# Patient Record
Sex: Female | Born: 1990 | Race: Black or African American | Hispanic: No | Marital: Married | Smoking: Never smoker
Health system: Southern US, Community
[De-identification: ages and names within clinical notes are randomized; demographics above are authoritative.]

## PROBLEM LIST (undated history)

## (undated) ENCOUNTER — Inpatient Hospital Stay: Payer: Self-pay

## (undated) ENCOUNTER — Inpatient Hospital Stay (HOSPITAL_COMMUNITY): Payer: Self-pay

## (undated) DIAGNOSIS — O24419 Gestational diabetes mellitus in pregnancy, unspecified control: Secondary | ICD-10-CM

## (undated) DIAGNOSIS — D573 Sickle-cell trait: Secondary | ICD-10-CM

## (undated) DIAGNOSIS — Z789 Other specified health status: Secondary | ICD-10-CM

## (undated) DIAGNOSIS — R7303 Prediabetes: Secondary | ICD-10-CM

## (undated) HISTORY — PX: TUBAL LIGATION: SHX77

## (undated) HISTORY — PX: NO PAST SURGERIES: SHX2092

## (undated) HISTORY — DX: Sickle-cell trait: D57.3

## (undated) HISTORY — DX: Prediabetes: R73.03

## (undated) HISTORY — DX: Gestational diabetes mellitus in pregnancy, unspecified control: O24.419

---
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
## 2012-04-07 ENCOUNTER — Encounter (HOSPITAL_COMMUNITY): Payer: Self-pay | Admitting: Emergency Medicine

## 2012-04-07 DIAGNOSIS — R109 Unspecified abdominal pain: Secondary | ICD-10-CM | POA: Insufficient documentation

## 2012-04-07 DIAGNOSIS — Z3202 Encounter for pregnancy test, result negative: Secondary | ICD-10-CM | POA: Insufficient documentation

## 2012-04-07 DIAGNOSIS — N898 Other specified noninflammatory disorders of vagina: Secondary | ICD-10-CM | POA: Insufficient documentation

## 2012-04-07 NOTE — ED Notes (Addendum)
C/o intermittent headache and abd pain x 2 weeks.  Pt states she did not have her period in December and started spotting today.  Vomited x 1 on Tuesday.  Denies nausea and denies pain at present.

## 2012-04-08 ENCOUNTER — Emergency Department (HOSPITAL_COMMUNITY)
Admission: EM | Admit: 2012-04-08 | Discharge: 2012-04-08 | Disposition: A | Discharge status: Home / Self Care | Payer: Self-pay | Attending: Emergency Medicine | Admitting: Emergency Medicine

## 2012-04-08 DIAGNOSIS — N939 Abnormal uterine and vaginal bleeding, unspecified: Secondary | ICD-10-CM

## 2012-04-08 DIAGNOSIS — R109 Unspecified abdominal pain: Secondary | ICD-10-CM

## 2012-04-08 LAB — URINALYSIS, MICROSCOPIC ONLY
Ketones, ur: 15 mg/dL — AB
Specific Gravity, Urine: 1.04 — ABNORMAL HIGH (ref 1.005–1.030)
Urobilinogen, UA: 1 mg/dL (ref 0.0–1.0)

## 2012-04-08 LAB — WET PREP, GENITAL
Trich, Wet Prep: NONE SEEN
Yeast Wet Prep HPF POC: NONE SEEN

## 2012-04-08 LAB — POCT PREGNANCY, URINE: Preg Test, Ur: NEGATIVE

## 2012-04-08 MED ORDER — NAPROXEN 500 MG PO TABS: 500.0000 mg | ORAL_TABLET | Freq: Two times a day (BID) | ORAL | Status: DC

## 2012-04-08 MED ORDER — KETOROLAC TROMETHAMINE 60 MG/2ML IM SOLN
60.0000 mg | Freq: Once | INTRAMUSCULAR | Status: AC
  Administered 2012-04-08: 60 mg via INTRAMUSCULAR
  Filled 2012-04-08: qty 2

## 2012-04-08 NOTE — ED Provider Notes (Signed)
History     CSN: [id number]  Arrival date & time 04/07/12  2252   First MD Initiated Contact with Patient 04/08/12 0054      Chief Complaint  Patient presents with  . Abdominal Pain    (Consider location/radiation/quality/duration/timing/severity/associated sxs/prior treatment) HPI Comments: 22 year old female with no past medical or surgical history presents with intermittent lower abdominal pain for the last couple of weeks. The patient readily admits that she has not been using her birth control pills regularly and will take them for a couple of days, missed a couple of days and then take a double pills the next day. This has been going on for some time, her menstrual periods have been somewhat irregular and she has not had a period in more than 6 weeks. Today she started having some spotting in conjunction with her lower abdominal tenderness was concerned hence her visit. She denies fevers chills nausea vomiting diarrhea back pain shortness of breath or coughing. She denies any history of being pregnant, she is sexually active but has not had any vaginal discharge. She admits to having one partner. This abdominal pain is intermittent, aching, not present at this time.  Patient is a 22 y.o. female presenting with abdominal pain. The history is provided by the patient.  Abdominal Pain The primary symptoms of the illness include abdominal pain.    Past Medical History  Diagnosis Date  . No pertinent past medical history     Past Surgical History  Procedure Date  . No past surgeries     Family History  Problem Relation Age of Onset  . Asthma Brother     History  Substance Use Topics  . Smoking status: Never Smoker   . Smokeless tobacco: Not on file  . Alcohol Use: No    OB History    Grav Para Term Preterm Abortions TAB SAB Ect Mult Living   0               Review of Systems  Gastrointestinal: Positive for abdominal pain.  All other systems reviewed and are  negative.    Allergies  Review of patient's allergies indicates no known allergies.  Home Medications   Current Outpatient Rx  Name  Route  Sig  Dispense  Refill  . NAPROXEN 500 MG PO TABS   Oral   Take 1 tablet (500 mg total) by mouth 2 (two) times daily with a meal.   30 tablet   0     BP 120/80  Pulse 87  Temp 98.1 F (36.7 C) (Oral)  Resp 18  SpO2 95%  LMP 04/07/2012  Physical Exam  Nursing note and vitals reviewed. Constitutional: She appears well-developed and well-nourished. No distress.  HENT:  Head: Normocephalic and atraumatic.  Mouth/Throat: Oropharynx is clear and moist. No oropharyngeal exudate.  Eyes: Conjunctivae normal and EOM are normal. Pupils are equal, round, and reactive to light. Right eye exhibits no discharge. Left eye exhibits no discharge. No scleral icterus.  Neck: Normal range of motion. Neck supple. No JVD present. No thyromegaly present.  Cardiovascular: Normal rate, regular rhythm, normal heart sounds and intact distal pulses.  Exam reveals no gallop and no friction rub.   No murmur heard. Pulmonary/Chest: Effort normal and breath sounds normal. No respiratory distress. She has no wheezes. She has no rales.  Abdominal: Soft. Bowel sounds are normal. She exhibits no distension and no mass. There is no tenderness.       No abdominal tenderness to  palpation  Genitourinary:       Chaperone present for vaginal exam, small amount of   blood in the vaginal vault, no cervical motion tenderness, cervical os closed, no adnexal masses or tenderness.  Musculoskeletal: Normal range of motion. She exhibits no edema and no tenderness.  Lymphadenopathy:    She has no cervical adenopathy.  Neurological: She is alert. Coordination normal.  Skin: Skin is warm and dry. No rash noted. No erythema.  Psychiatric: She has a normal mood and affect. Her behavior is normal.    ED Course  Procedures (including critical care time)  Labs Reviewed  URINALYSIS,  MICROSCOPIC ONLY - Abnormal; Notable for the following:    Color, Urine AMBER (*)  BIOCHEMICALS MAY BE AFFECTED BY COLOR   APPearance CLOUDY (*)     Specific Gravity, Urine 1.040 (*)     Hgb urine dipstick LARGE (*)     Bilirubin Urine SMALL (*)     Ketones, ur 15 (*)     Protein, ur 30 (*)     Leukocytes, UA SMALL (*)     Bacteria, UA FEW (*)     All other components within normal limits  POCT PREGNANCY, URINE  URINE CULTURE  WET PREP, GENITAL  GC/CHLAMYDIA PROBE AMP   No results found.   1. Vaginal bleeding   2. Abdominal pain       MDM  Laboratory workup shows that the patient has hematuria, negative pregnancy test and a slightly high specific gravity with mild ketonuria. Other than being mildly dehydrated I suspect that the hematuria is related to a contaminated sample with her vaginal bleeding. She has no signs of ovarian cysts and no tenderness that would be consistent with appendicitis. The patient will be treated with anti-inflammatories and asked to followup with her gynecologist. No imaging is indicated at this time the        Vida Roller, MD 04/08/12 [phone]

## 2012-04-09 LAB — URINE CULTURE

## 2012-04-10 LAB — GC/CHLAMYDIA PROBE AMP
CT Probe RNA: POSITIVE — AB
GC Probe RNA: NEGATIVE

## 2012-04-11 NOTE — ED Notes (Signed)
+  Chlamydia Chart sent to EDP office for review.  

## 2012-04-12 NOTE — ED Notes (Signed)
Rx called to [facility] by KIM PFM

## 2012-04-12 NOTE — ED Notes (Signed)
Patient informed of positive results after id'd x 2 and informed of need to notify partner to be treated. [facility] faxed.

## 2012-04-12 NOTE — ED Notes (Signed)
Rx for Doxycyline 100 BID x 7 days & Cefixime 400 mg po x 1 written by Jonathon Jordan needs to be called to pharmacy.

## 2013-05-23 ENCOUNTER — Emergency Department (HOSPITAL_COMMUNITY): Payer: Medicaid Other

## 2013-05-23 ENCOUNTER — Emergency Department (HOSPITAL_COMMUNITY)
Admission: EM | Admit: 2013-05-23 | Discharge: 2013-05-24 | Disposition: A | Discharge status: Home / Self Care | Payer: Medicaid Other | Attending: Emergency Medicine | Admitting: Emergency Medicine

## 2013-05-23 ENCOUNTER — Encounter (HOSPITAL_COMMUNITY): Payer: Self-pay | Admitting: Emergency Medicine

## 2013-05-23 DIAGNOSIS — S99929A Unspecified injury of unspecified foot, initial encounter: Principal | ICD-10-CM

## 2013-05-23 DIAGNOSIS — Z23 Encounter for immunization: Secondary | ICD-10-CM | POA: Insufficient documentation

## 2013-05-23 DIAGNOSIS — S8990XA Unspecified injury of unspecified lower leg, initial encounter: Secondary | ICD-10-CM

## 2013-05-23 DIAGNOSIS — IMO0002 Reserved for concepts with insufficient information to code with codable children: Secondary | ICD-10-CM | POA: Insufficient documentation

## 2013-05-23 DIAGNOSIS — W1809XA Striking against other object with subsequent fall, initial encounter: Secondary | ICD-10-CM | POA: Insufficient documentation

## 2013-05-23 DIAGNOSIS — S99919A Unspecified injury of unspecified ankle, initial encounter: Principal | ICD-10-CM

## 2013-05-23 DIAGNOSIS — Z791 Long term (current) use of non-steroidal anti-inflammatories (NSAID): Secondary | ICD-10-CM | POA: Insufficient documentation

## 2013-05-23 DIAGNOSIS — Y9301 Activity, walking, marching and hiking: Secondary | ICD-10-CM | POA: Insufficient documentation

## 2013-05-23 DIAGNOSIS — Y92410 Unspecified street and highway as the place of occurrence of the external cause: Secondary | ICD-10-CM | POA: Insufficient documentation

## 2013-05-23 NOTE — ED Notes (Signed)
Presents with fall post argument with a family member. Pt reports walking down the street and tripping and falling forward, injuring her left knee and right hand and hitting the right side of her head. Denies LOC. Reports pain all over. Difficult to get information from, unwilling to speak or elaborate much. No deformity to knee.

## 2013-05-23 NOTE — ED Notes (Signed)
Patient is not forth coming with information. Does not want to talk to staff.

## 2013-05-23 NOTE — ED Provider Notes (Signed)
CSN: [id number]     Arrival date & time 05/23/13  2155 History  This chart was scribed for non-physician practitioner, Arthor Captain, PA-C working with Vida Roller, MD by Greggory Stallion, ED scribe. This patient was seen in room TR09C/TR09C and the patient's care was started at 12:05 AM.   Chief Complaint  Patient presents with  . Fall   The history is provided by the patient. No language interpreter was used.   HPI Comments: Sarah Mayo is a 23 y.o. female who presents to the Emergency Department complaining of a fall that occurred earlier tonight. She tripped, fell forward and landed on left knee. Pt states she also his the right side of her head but denies LOC. She has sudden onset left knee. Ambulation and bending her knee worsen the pain. Pt also has an abrasion to her left knee. She is unsure of when her last tetanus was.   Past Medical History  Diagnosis Date  . No pertinent past medical history    Past Surgical History  Procedure Laterality Date  . No past surgeries     Family History  Problem Relation Age of Onset  . Asthma Brother    History  Substance Use Topics  . Smoking status: Never Smoker   . Smokeless tobacco: Not on file  . Alcohol Use: No   OB History   Grav Para Term Preterm Abortions TAB SAB Ect Mult Living   0              Review of Systems  Constitutional: Negative for fever.  HENT: Negative for congestion.   Eyes: Negative for redness.  Respiratory: Negative for shortness of breath.   Cardiovascular: Negative for chest pain.  Gastrointestinal: Negative for abdominal distention.  Musculoskeletal: Positive for arthralgias.  Skin: Positive for wound.  Neurological: Negative for speech difficulty.  Psychiatric/Behavioral: Negative for confusion.   Allergies  Review of patient's allergies indicates no known allergies.  Home Medications   Current Outpatient Rx  Name  Route  Sig  Dispense  Refill  . naproxen (NAPROSYN) 500 MG tablet  Oral   Take 1 tablet (500 mg total) by mouth 2 (two) times daily with a meal.   30 tablet   0    BP 120/70  Pulse 100  Temp(Src) 97.7 F (36.5 C) (Oral)  Resp 18  SpO2 100%  LMP 05/21/2013  Physical Exam  Nursing note and vitals reviewed. Constitutional: She is oriented to person, place, and time. She appears well-developed and well-nourished. No distress.  HENT:  Head: Normocephalic and atraumatic.  Eyes: EOM are normal.  Neck: Neck supple. No tracheal deviation present.  Cardiovascular: Normal rate.   Pulmonary/Chest: Effort normal. No respiratory distress.  Musculoskeletal: Normal range of motion.  Patella located in the same position bilaterally. Hyperextensible knees. Pain with flexion. Ligamentously stable.   Neurological: She is alert and oriented to person, place, and time.  Skin: Skin is warm and dry.  Psychiatric: She has a normal mood and affect. Her behavior is normal.    ED Course  Procedures (including critical care time)  DIAGNOSTIC STUDIES: Oxygen Saturation is 100% on RA, normal by my interpretation.    COORDINATION OF CARE: 12:08 AM-Discussed treatment plan which includes knee brace and crutches with pt at bedside and pt agreed to plan. Advised pt to follow up with an orthopedist if symptoms do not resolve.   Labs Review Labs Reviewed  POC URINE PREG, ED   Imaging Review Dg Knee  2 Views Left  05/23/2013   CLINICAL DATA:  Fall.  Pain.  EXAM: LEFT KNEE - 1-2 VIEW  COMPARISON:  None.  FINDINGS: Two view examination of the left knee without fracture or dislocation. Patella is slightly high riding. No obvious disruption of the patellar tendon.  IMPRESSION: No fracture or dislocation.  High riding patella.   Electronically Signed   By: Bridgett LarssonSteve  Olson M.D.   On: 05/23/2013 23:17     EKG Interpretation None      MDM   Final diagnoses:  Knee injury   Patient X-Ray negative for obvious fracture or dislocation. Pain managed in ED. Pt advised to follow up  with orthopedics if symptoms persist for possibility of missed fracture diagnosis. Patient given brace while in ED, conservative therapy recommended and discussed. Patient will be dc home & is agreeable with above plan.   I personally performed the services described in this documentation, which was scribed in my presence. The recorded information has been reviewed and is accurate.     Arthor CaptainAbigail Oval Moralez, PA-C 05/24/13 [phone]

## 2013-05-24 MED ORDER — TETANUS-DIPHTH-ACELL PERTUSSIS 5-2.5-18.5 LF-MCG/0.5 IM SUSP
0.5000 mL | Freq: Once | INTRAMUSCULAR | Status: AC
  Administered 2013-05-24: 0.5 mL via INTRAMUSCULAR
  Filled 2013-05-24: qty 0.5

## 2013-05-24 NOTE — Discharge Instructions (Signed)
Knee Sprain  A knee sprain is a tear in one of the strong, fibrous tissues that connect the bones (ligaments) in your knee. The severity of the sprain depends on how much of the ligament is torn. The tear can be either partial or complete.  CAUSES   Often, sprains are a result of a fall or injury. The force of the impact causes the fibers of your ligament to stretch too much. This excess tension causes the fibers of your ligament to tear.  SIGNS AND SYMPTOMS   You may have some loss of motion in your knee. Other symptoms include:   Bruising.   Pain in the knee area.   Tenderness of the knee to the touch.   Swelling.  DIAGNOSIS   To diagnose a knee sprain, your health care provider will physically examine your knee. Your health care provider may also suggest an X-ray exam of your knee to make sure no bones are broken.  TREATMENT   If your ligament is only partially torn, treatment usually involves keeping the knee in a fixed position (immobilization) or bracing your knee for activities that require movement for several weeks. To do this, your health care provider will apply a bandage, cast, or splint to keep your knee from moving and to support your knee during movement until it heals. For a partially torn ligament, the healing process usually takes 4 6 weeks.  If your ligament is completely torn, depending on which ligament it is, you may need surgery to reconnect the ligament to the bone or reconstruct it. After surgery, a cast or splint may be applied and will need to stay on your knee for 4 6 weeks while your ligament heals.  HOME CARE INSTRUCTIONS   Keep your injured knee elevated to decrease swelling.   To ease pain and swelling, apply ice to the injured area:   Put ice in a plastic bag.   Place a towel between your skin and the bag.   Leave the ice on for 20 minutes, 2 3 times a day.   Only take medicine for pain as directed by your health care provider.   Do not leave your knee unprotected until  pain and stiffness go away (usually 4 6 weeks).   If you have a cast or splint, do not allow it to get wet. If you have been instructed not to remove it, cover it with a plastic bag when you shower or bathe. Do not swim.   Your health care provider may suggest exercises for you to do during your recovery to prevent or limit permanent weakness and stiffness.  SEEK IMMEDIATE MEDICAL CARE IF:   Your cast or splint becomes damaged.   Your pain becomes worse.   You have significant pain, swelling, or numbness below the cast or splint.  MAKE SURE YOU:   Understand these instructions.   Will watch your condition.   Will get help right away if you are not doing well or get worse.  Document Released: 03/08/2005 Document Revised: 12/27/2012 Document Reviewed: 10/18/2012  [employer] Patient Information 2014 [employer].

## 2013-05-25 NOTE — ED Provider Notes (Signed)
Medical screening examination/treatment/procedure(s) were performed by non-physician practitioner and as supervising physician I was immediately available for consultation/collaboration.    Michela Herst D Soo Steelman, MD 05/25/13 0028 

## 2013-11-23 ENCOUNTER — Inpatient Hospital Stay (HOSPITAL_COMMUNITY): Payer: Medicaid Other

## 2013-11-23 ENCOUNTER — Inpatient Hospital Stay (HOSPITAL_COMMUNITY)
Admission: AD | Admit: 2013-11-23 | Discharge: 2013-11-23 | Disposition: A | Discharge status: Home / Self Care | Payer: Medicaid Other | Source: Ambulatory Visit | Attending: Obstetrics & Gynecology | Admitting: Obstetrics & Gynecology

## 2013-11-23 ENCOUNTER — Encounter (HOSPITAL_COMMUNITY): Payer: Self-pay | Admitting: Emergency Medicine

## 2013-11-23 ENCOUNTER — Emergency Department (INDEPENDENT_AMBULATORY_CARE_PROVIDER_SITE_OTHER)
Admission: EM | Admit: 2013-11-23 | Discharge: 2013-11-23 | Disposition: A | Discharge status: Other Acute Inpatient Hospital | Payer: Medicaid Other | Source: Home / Self Care | Attending: Emergency Medicine | Admitting: Emergency Medicine

## 2013-11-23 ENCOUNTER — Encounter (HOSPITAL_COMMUNITY): Payer: Self-pay

## 2013-11-23 DIAGNOSIS — R1084 Generalized abdominal pain: Secondary | ICD-10-CM

## 2013-11-23 DIAGNOSIS — Z3201 Encounter for pregnancy test, result positive: Secondary | ICD-10-CM

## 2013-11-23 DIAGNOSIS — O99891 Other specified diseases and conditions complicating pregnancy: Secondary | ICD-10-CM | POA: Diagnosis not present

## 2013-11-23 DIAGNOSIS — O26899 Other specified pregnancy related conditions, unspecified trimester: Secondary | ICD-10-CM

## 2013-11-23 DIAGNOSIS — R109 Unspecified abdominal pain: Secondary | ICD-10-CM | POA: Diagnosis not present

## 2013-11-23 DIAGNOSIS — Z3491 Encounter for supervision of normal pregnancy, unspecified, first trimester: Secondary | ICD-10-CM

## 2013-11-23 DIAGNOSIS — O9989 Other specified diseases and conditions complicating pregnancy, childbirth and the puerperium: Secondary | ICD-10-CM

## 2013-11-23 HISTORY — DX: Other specified health status: Z78.9

## 2013-11-23 LAB — CBC WITH DIFFERENTIAL/PLATELET
Basophils Absolute: 0.1 10*3/uL (ref 0.0–0.1)
Basophils Relative: 1 % (ref 0–1)
EOS PCT: 1 % (ref 0–5)
Eosinophils Absolute: 0.1 10*3/uL (ref 0.0–0.7)
HEMATOCRIT: 33.5 % — AB (ref 36.0–46.0)
HEMOGLOBIN: 11.8 g/dL — AB (ref 12.0–15.0)
LYMPHS ABS: 2.7 10*3/uL (ref 0.7–4.0)
LYMPHS PCT: 27 % (ref 12–46)
MCH: 28.2 pg (ref 26.0–34.0)
MCHC: 35.2 g/dL (ref 30.0–36.0)
MCV: 80.1 fL (ref 78.0–100.0)
MONO ABS: 0.8 10*3/uL (ref 0.1–1.0)
MONOS PCT: 8 % (ref 3–12)
NEUTROS ABS: 6.4 10*3/uL (ref 1.7–7.7)
Neutrophils Relative %: 64 % (ref 43–77)
Platelets: 270 10*3/uL (ref 150–400)
RBC: 4.18 MIL/uL (ref 3.87–5.11)
RDW: 13.8 % (ref 11.5–15.5)
WBC: 10 10*3/uL (ref 4.0–10.5)

## 2013-11-23 LAB — POCT URINALYSIS DIP (DEVICE)
Bilirubin Urine: NEGATIVE
GLUCOSE, UA: NEGATIVE mg/dL
HGB URINE DIPSTICK: NEGATIVE
KETONES UR: NEGATIVE mg/dL
Nitrite: NEGATIVE
Protein, ur: NEGATIVE mg/dL
SPECIFIC GRAVITY, URINE: 1.02 (ref 1.005–1.030)
UROBILINOGEN UA: 2 mg/dL — AB (ref 0.0–1.0)
pH: 8.5 — ABNORMAL HIGH (ref 5.0–8.0)

## 2013-11-23 LAB — POCT PREGNANCY, URINE: PREG TEST UR: POSITIVE — AB

## 2013-11-23 LAB — HCG, QUANTITATIVE, PREGNANCY: hCG, Beta Chain, Quant, S: 228 m[IU]/mL — ABNORMAL HIGH (ref <5)

## 2013-11-23 LAB — ABO/RH: ABO/RH(D): A POS

## 2013-11-23 MED ORDER — PRENATAL VITAMINS PLUS 27-1 MG PO TABS: ORAL_TABLET | ORAL | Status: DC

## 2013-11-23 NOTE — MAU Note (Signed)
Patient was seen at Urgent Care today and sent to MAU for further evaluation. State she has had abdominal pain off and on, no bleeding or discharge. Some nausea, no vomiting.

## 2013-11-23 NOTE — MAU Provider Note (Signed)
History     CSN: [id number]  Arrival date and time: 11/23/13 4098   First Provider Initiated Contact with Patient 11/23/13 1851      Chief Complaint  Patient presents with  . Nausea  . Abdominal Pain   HPI Sarah Mayo is 23 y.o. G1P0 [redacted]w[redacted]d weeks presents for ectopic workup after being seen by Dr. Lorenz Coaster at [facility] earlier--she had + UPT and pelvic exam that he reports she had right sided tenderness without masses.  She denies vaginal bleeding.  Patient denies any pain at this time.    Past Medical History  Diagnosis Date  . No pertinent past medical history   . Medical history non-contributory     Past Surgical History  Procedure Laterality Date  . No past surgeries      Family History  Problem Relation Age of Onset  . Asthma Brother     History  Substance Use Topics  . Smoking status: Never Smoker   . Smokeless tobacco: Not on file  . Alcohol Use: No    Allergies: No Known Allergies  No prescriptions prior to admission    Review of Systems  Constitutional: Negative for fever and chills.  Gastrointestinal: Positive for abdominal pain (lower abdominal pain). Negative for nausea and vomiting.  Genitourinary:       Neg for vaginal bleeding  Neurological: Negative for headaches.   Physical Exam   Blood pressure 108/60, pulse 90, temperature 99.1 F (37.3 C), temperature source Oral, resp. rate 16, height  (1.727 m), weight 168 lb 3.2 oz (76.295 kg), last menstrual period 10/12/2013, SpO2 100.00%.  Physical Exam  Constitutional: She is oriented to person, place, and time. She appears well-developed and well-nourished. No distress.  HENT:  Head: Normocephalic.  Neck: Normal range of motion.  Cardiovascular: Normal rate.   Respiratory: Effort normal.  GI: Soft.  Genitourinary:  Pelvic exam was not repeated--neg for bleeding and pain at this time.  Neurological: She is alert and oriented to person, place, and time.  Skin: Skin is warm and dry.   Psychiatric: She has a normal mood and affect. Her behavior is normal.   Results for orders placed during the hospital encounter of 11/23/13 (from the past 24 hour(s))  CBC WITH DIFFERENTIAL     Status: Abnormal   Collection Time    11/23/13  6:45 PM      Result Value Ref Range   WBC 10.0  4.0 - 10.5 K/uL   RBC 4.18  3.87 - 5.11 MIL/uL   Hemoglobin 11.8 (*) 12.0 - 15.0 g/dL   HCT 11.9 (*) 14.7 - 82.9 %   MCV 80.1  78.0 - 100.0 fL   MCH 28.2  26.0 - 34.0 pg   MCHC 35.2  30.0 - 36.0 g/dL   RDW 56.2  13.0 - 86.5 %   Platelets 270  150 - 400 K/uL   Neutrophils Relative % 64  43 - 77 %   Neutro Abs 6.4  1.7 - 7.7 K/uL   Lymphocytes Relative 27  12 - 46 %   Lymphs Abs 2.7  0.7 - 4.0 K/uL   Monocytes Relative 8  3 - 12 %   Monocytes Absolute 0.8  0.1 - 1.0 K/uL   Eosinophils Relative 1  0 - 5 %   Eosinophils Absolute 0.1  0.0 - 0.7 K/uL   Basophils Relative 1  0 - 1 %   Basophils Absolute 0.1  0.0 - 0.1 K/uL  ABO/RH  Status: None   Collection Time    11/23/13  6:45 PM      Result Value Ref Range   ABO/RH(D) A POS    HCG, QUANTITATIVE, PREGNANCY     Status: Abnormal   Collection Time    11/23/13  6:45 PM      Result Value Ref Range   hCG, Beta Chain, Quant, S 228 (*) <5 mIU/mL   MAU Course  Procedures  UPT at [facility] was positive  MDM 20:00 Care turned over to [facility]. Mayford Knife, CNM Assessment and Plan  A:  Abdominal pain at [redacted]wks gestation  Sarah Mayo 11/23/2013, 7:42 PM    US Ob Transvaginal  11/23/2013   CLINICAL DATA:  Abdominal and pelvic pain. Positive urine pregnancy test.  EXAM: OBSTETRIC <14 WK [place] AND TRANSVAGINAL OB US  TECHNIQUE: Both transabdominal and transvaginal ultrasound examinations were performed for complete evaluation of the gestation as well as the maternal uterus, adnexal regions, and pelvic cul-de-sac. Transvaginal technique was performed to assess early pregnancy.  COMPARISON:  None.    FINDINGS: Intrauterine gestational sac: Not visualized  Yolk sac:  Not  visualized  Embryo:  Not visualized  Cardiac Activity: No embryo seen  Maternal uterus/adnexae: Right ovarian corpus luteum/ hemorrhagic cyst noted. Left ovary is normal. Moderate fluid is present in the cul-de-sac. Endometrial stripe measures 9 mm, trilaminar in appearance.    IMPRESSION: No intrauterine gestational sac, yolk sac, or fetal pole identified. Differential considerations include intrauterine pregnancy too early to be sonographically visualized, missed abortion, or ectopic pregnancy. Followup ultrasound is recommended in 10-14 days for further evaluation.     Electronically Signed   By: Christiana Pellant M.D.   On: 11/23/2013 20:39   Discussed findings. Cannot exclude ectopic pregnancy yet Recommend repeat Quant HCG in 48 hrs.  Then repeat US in a week or so Ectopic precautions reviewed Sarah Mayo, CNM

## 2013-11-23 NOTE — Discharge Instructions (Signed)
First Trimester of Pregnancy The first trimester of pregnancy is from week 1 until the end of week 12 (months 1 through 3). A week after a sperm fertilizes an egg, the egg will implant on the wall of the uterus. This embryo will begin to develop into a baby. Genes from you and your partner are forming the baby. The female genes determine whether the baby is a boy or a girl. At 6-8 weeks, the eyes and face are formed, and the heartbeat can be seen on ultrasound. At the end of 12 weeks, all the baby's organs are formed.  Now that you are pregnant, you will want to do everything you can to have a healthy baby. Two of the most important things are to get good prenatal care and to follow your health care provider's instructions. Prenatal care is all the medical care you receive before the baby's birth. This care will help prevent, find, and treat any problems during the pregnancy and childbirth. BODY CHANGES Your body goes through many changes during pregnancy. The changes vary from woman to woman.   You may gain or lose a couple of pounds at first.  You may feel sick to your stomach (nauseous) and throw up (vomit). If the vomiting is uncontrollable, call your health care provider.  You may tire easily.  You may develop headaches that can be relieved by medicines approved by your health care provider.  You may urinate more often. Painful urination may mean you have a bladder infection.  You may develop heartburn as a result of your pregnancy.  You may develop constipation because certain hormones are causing the muscles that push waste through your intestines to slow down.  You may develop hemorrhoids or swollen, bulging veins (varicose veins).  Your breasts may begin to grow larger and become tender. Your nipples may stick out more, and the tissue that surrounds them (areola) may become darker.  Your gums may bleed and may be sensitive to brushing and flossing.  Dark spots or blotches (chloasma,  mask of pregnancy) may develop on your face. This will likely fade after the baby is born.  Your menstrual periods will stop.  You may have a loss of appetite.  You may develop cravings for certain kinds of food.  You may have changes in your emotions from day to day, such as being excited to be pregnant or being concerned that something may go wrong with the pregnancy and baby.  You may have more vivid and strange dreams.  You may have changes in your hair. These can include thickening of your hair, rapid growth, and changes in texture. Some women also have hair loss during or after pregnancy, or hair that feels dry or thin. Your hair will most likely return to normal after your baby is born. WHAT TO EXPECT AT YOUR PRENATAL VISITS During a routine prenatal visit:  You will be weighed to make sure you and the baby are growing normally.  Your blood pressure will be taken.  Your abdomen will be measured to track your baby's growth.  The fetal heartbeat will be listened to starting around week 10 or 12 of your pregnancy.  Test results from any previous visits will be discussed. Your health care provider may ask you:  How you are feeling.  If you are feeling the baby move.  If you have had any abnormal symptoms, such as leaking fluid, bleeding, severe headaches, or abdominal cramping.  If you have any questions. Other tests   that may be performed during your first trimester include:  Blood tests to find your blood type and to check for the presence of any previous infections. They will also be used to check for low iron levels (anemia) and Rh antibodies. Later in the pregnancy, blood tests for diabetes will be done along with other tests if problems develop.  Urine tests to check for infections, diabetes, or protein in the urine.  An ultrasound to confirm the proper growth and development of the baby.  An amniocentesis to check for possible genetic problems.  Fetal screens for  spina bifida and Down syndrome.  You may need other tests to make sure you and the baby are doing well. HOME CARE INSTRUCTIONS  Medicines  Follow your health care provider's instructions regarding medicine use. Specific medicines may be either safe or unsafe to take during pregnancy.  Take your prenatal vitamins as directed.  If you develop constipation, try taking a stool softener if your health care provider approves. Diet  Eat regular, well-balanced meals. Choose a variety of foods, such as meat or vegetable-based protein, fish, milk and low-fat dairy products, vegetables, fruits, and whole grain breads and cereals. Your health care provider will help you determine the amount of weight gain that is right for you.  Avoid raw meat and uncooked cheese. These carry germs that can cause birth defects in the baby.  Eating four or five small meals rather than three large meals a day may help relieve nausea and vomiting. If you start to feel nauseous, eating a few soda crackers can be helpful. Drinking liquids between meals instead of during meals also seems to help nausea and vomiting.  If you develop constipation, eat more high-fiber foods, such as fresh vegetables or fruit and whole grains. Drink enough fluids to keep your urine clear or pale yellow. Activity and Exercise  Exercise only as directed by your health care provider. Exercising will help you:  Control your weight.  Stay in shape.  Be prepared for labor and delivery.  Experiencing pain or cramping in the lower abdomen or low back is a good sign that you should stop exercising. Check with your health care provider before continuing normal exercises.  Try to avoid standing for long periods of time. Move your legs often if you must stand in one place for a long time.  Avoid heavy lifting.  Wear low-heeled shoes, and practice good posture.  You may continue to have sex unless your health care provider directs you  otherwise. Relief of Pain or Discomfort  Wear a good support bra for breast tenderness.   Take warm sitz baths to soothe any pain or discomfort caused by hemorrhoids. Use hemorrhoid cream if your health care provider approves.   Rest with your legs elevated if you have leg cramps or low back pain.  If you develop varicose veins in your legs, wear support hose. Elevate your feet for 15 minutes, 3-4 times a day. Limit salt in your diet. Prenatal Care  Schedule your prenatal visits by the twelfth week of pregnancy. They are usually scheduled monthly at first, then more often in the last 2 months before delivery.  Write down your questions. Take them to your prenatal visits.  Keep all your prenatal visits as directed by your health care provider. Safety  Wear your seat belt at all times when driving.  Make a list of emergency phone numbers, including numbers for family, friends, the hospital, and police and fire departments. General Tips    Ask your health care provider for a referral to a local prenatal education class. Begin classes no later than at the beginning of month 6 of your pregnancy.  Ask for help if you have counseling or nutritional needs during pregnancy. Your health care provider can offer advice or refer you to specialists for help with various needs.  Do not use hot tubs, steam rooms, or saunas.  Do not douche or use tampons or scented sanitary pads.  Do not cross your legs for long periods of time.  Avoid cat litter boxes and soil used by cats. These carry germs that can cause birth defects in the baby and possibly loss of the fetus by miscarriage or stillbirth.  Avoid all smoking, herbs, alcohol, and medicines not prescribed by your health care provider. Chemicals in these affect the formation and growth of the baby.  Schedule a dentist appointment. At home, brush your teeth with a soft toothbrush and be gentle when you floss. SEEK MEDICAL CARE IF:   You have  dizziness.  You have mild pelvic cramps, pelvic pressure, or nagging pain in the abdominal area.  You have persistent nausea, vomiting, or diarrhea.  You have a bad smelling vaginal discharge.  You have pain with urination.  You notice increased swelling in your face, hands, legs, or ankles. SEEK IMMEDIATE MEDICAL CARE IF:   You have a fever.  You are leaking fluid from your vagina.  You have spotting or bleeding from your vagina.  You have severe abdominal cramping or pain.  You have rapid weight gain or loss.  You vomit blood or material that looks like coffee grounds.  You are exposed to German measles and have never had them.  You are exposed to fifth disease or chickenpox.  You develop a severe headache.  You have shortness of breath.  You have any kind of trauma, such as from a fall or a car accident. Document Released: 03/02/2001 Document Revised: 07/23/2013 Document Reviewed: 01/16/2013 [employer] Patient Information 2015 [employer]. This information is not intended to replace advice given to you by your health care provider. Make sure you discuss any questions you have with your health care provider.  

## 2013-11-23 NOTE — ED Provider Notes (Signed)
Chief Complaint   Chief Complaint  Patient presents with  . Abdominal Pain    History of Present Illness   Sarah Mayo is a 23 year old female who has had a two-day history of generalized abdominal pain. The pain comes and goes. Nothing makes it better or worse. It is not worse with meals. It's not better with passage of a bowel movement or passage of flatus. She's felt somewhat nauseated and dizzy. She denies any fever, chills, vomiting, or anorexia. The patient states that her appetite has been increased. She denies any urinary symptoms. No diarrhea, constipation, or blood in the stool. She denies any vaginal discharge, itching, or odor. Her last menstrual period was July 24. The patient is sexually active without any use of birth control. She denies any morning sickness or breast swelling or tenderness. She has had no vaginal bleeding or passage of clots or tissue.  Review of Systems   Other than as noted above, the patient denies any of the following symptoms: Systemic:  No fever or chills GI:  No abdominal pain, nausea, vomiting, diarrhea, constipation, melena or hematochezia. GU:  No dysuria, frequency, urgency, hematuria, vaginal discharge, itching, or abnormal vaginal bleeding.  PMFSH   Past medical history, family history, social history, meds, and allergies were reviewed.    Physical Examination    Vital signs:  BP 113/78  Pulse 97  Temp(Src) 99.2 F (37.3 C) (Oral)  Resp 14  SpO2 100%  LMP 10/12/2013 General:  Alert, oriented and in no distress. Lungs:  Breath sounds clear and equal bilaterally.  No wheezes, rales or rhonchi. Heart:  Regular rhythm.  No gallops or murmers. Abdomen:  Soft, flat and non-distended.  No organomegaly or mass.  There is mild, generalized tenderness to palpation without guarding or rebound.  Bowel sounds normally active. Pelvic exam:  Normal external genitalia. There was a small amount of white vaginal discharge. No bleeding. There was  no pain on cervical motion. Uterus was normal in size and shape and nontender. She has bilateral adnexal tenderness worse on the left than the right and no adnexal mass.  DNA probes for gonorrhea, Chlamydia, Trichomonas, Gardnerella, Candida were obtained. Skin:  Clear, warm and dry.  Chaperoned by Michiel Cowboy, RN who was present throughout the pelvic exam.   Labs   Results for orders placed during the hospital encounter of 11/23/13  POCT URINALYSIS DIP (DEVICE)      Result Value Ref Range   Glucose, UA NEGATIVE  NEGATIVE mg/dL   Bilirubin Urine NEGATIVE  NEGATIVE   Ketones, ur NEGATIVE  NEGATIVE mg/dL   Specific Gravity, Urine 1.020  1.005 - 1.030   Hgb urine dipstick NEGATIVE  NEGATIVE   pH 8.5 (*) 5.0 - 8.0   Protein, ur NEGATIVE  NEGATIVE mg/dL   Urobilinogen, UA 2.0 (*) 0.0 - 1.0 mg/dL   Nitrite NEGATIVE  NEGATIVE   Leukocytes, UA TRACE (*) NEGATIVE  POCT PREGNANCY, URINE      Result Value Ref Range   Preg Test, Ur POSITIVE (*) NEGATIVE    Assessment   The primary encounter diagnosis was Generalized abdominal pain. A diagnosis of First trimester pregnancy was also pertinent to this visit.  There is a concern for ectopic pregnancy with adnexal tenderness and early pregnancy. The patient was therefore sent to [facility] hospital. I called and spoke to the provider at the MAU. She will go directly care and was instructed not to either drink anything. She has a friend who can drive  her over there. She is safe to travel by private vehicle since she is hemodynamically stable.       Plan    1.  Meds:  The following meds were prescribed:   Discharge Medication List as of 11/23/2013  5:39 PM    START taking these medications   Details  Prenatal Vit-Fe Fumarate-FA (PRENATAL VITAMINS PLUS) 27-1 MG TABS Take 1 daily, Print        2.  Patient Education/Counseling:  The patient was given appropriate handouts, self care instructions, and instructed in symptomatic relief.    3.  Follow  up:  The patient was told to follow up here if no better in 3 to 4 days, or sooner if becoming worse in any way, and given some red flag symptoms such as worsening pain, fever, persistent vomiting, or heavy vaginal bleeding which would prompt immediate return.       Reuben Likes, MD 11/23/13 [phone]

## 2013-11-23 NOTE — ED Notes (Signed)
C/o lower abdominal area pain  X 2 days. LMP 7-24 through 8-1. Sexually active w/o BC, although reportedly not attempting to conceive. Although having steady pain, has not passed any blood or tissue

## 2013-11-24 NOTE — MAU Provider Note (Signed)
Attestation of Attending Supervision of Advanced Practitioner (CNM/NP): Evaluation and management procedures were performed by the Advanced Practitioner under my supervision and collaboration. I have reviewed the Advanced Practitioner's note and chart, and I agree with the management and plan.  Sarah Mayo H. 12:30 AM

## 2013-11-25 ENCOUNTER — Inpatient Hospital Stay (HOSPITAL_COMMUNITY)
Admission: AD | Admit: 2013-11-25 | Discharge: 2013-11-25 | Disposition: A | Discharge status: Home / Self Care | Payer: Medicaid Other | Source: Ambulatory Visit | Attending: Obstetrics & Gynecology | Admitting: Obstetrics & Gynecology

## 2013-11-25 DIAGNOSIS — R109 Unspecified abdominal pain: Secondary | ICD-10-CM | POA: Insufficient documentation

## 2013-11-25 DIAGNOSIS — O26899 Other specified pregnancy related conditions, unspecified trimester: Secondary | ICD-10-CM

## 2013-11-25 DIAGNOSIS — O9989 Other specified diseases and conditions complicating pregnancy, childbirth and the puerperium: Principal | ICD-10-CM

## 2013-11-25 DIAGNOSIS — O99891 Other specified diseases and conditions complicating pregnancy: Secondary | ICD-10-CM | POA: Insufficient documentation

## 2013-11-25 LAB — HCG, QUANTITATIVE, PREGNANCY: HCG, BETA CHAIN, QUANT, S: 696 m[IU]/mL — AB (ref <5)

## 2013-11-25 NOTE — MAU Note (Signed)
Here for follow up labs. No complaints.

## 2013-11-25 NOTE — MAU Provider Note (Signed)
  History     CSN: [id number]  Arrival date and time: 11/25/13 2029   None     No chief complaint on file.  HPI  Pt is a 23 yo G1P0 at [redacted]w[redacted]d wks pregnancy here for follow-up BHCG.  Seen on 11/23/13  for ectopic workup after being seen by Dr. Lorenz Coaster at [facility] earlier--she had + UPT and pelvic exam that he reported right sided tenderness without masses. Pt did not have any vaginal bleeding at that visit.  BHCG was 228 and ultrasound showed IUGS, no yolk sac or embryo.  Here tonight with no report of pelvic pain or vaginal bleeding.     Past Medical History  Diagnosis Date  . No pertinent past medical history   . Medical history non-contributory     Past Surgical History  Procedure Laterality Date  . No past surgeries      Family History  Problem Relation Age of Onset  . Asthma Brother     History  Substance Use Topics  . Smoking status: Never Smoker   . Smokeless tobacco: Not on file  . Alcohol Use: No    Allergies: No Known Allergies  No prescriptions prior to admission    Review of Systems  Gastrointestinal: Negative for abdominal pain.  Genitourinary:       Neg vaginal bleeding   Pertinent information in HPI  Physical Exam   Last menstrual period 10/12/2013.  Physical Exam  Constitutional: She is oriented to person, place, and time. She appears well-developed and well-nourished. No distress.  HENT:  Head: Normocephalic.  Neck: Normal range of motion. Neck supple.  Neurological: She is alert and oriented to person, place, and time. She has normal reflexes.  Skin: Skin is warm and dry.    MAU Course  Procedures Results for orders placed during the hospital encounter of 11/25/13 (from the past 24 hour(s))  HCG, QUANTITATIVE, PREGNANCY     Status: Abnormal   Collection Time    11/25/13  8:55 PM      Result Value Ref Range   hCG, Beta Chain, Quant, S 696 (*) <5 mIU/mL      Assessment and Plan  Follow-up BHCG  Plan: Discharge to home Repeat  ultrasound in 7 days Reviewed ectopic precautions  Elenora Fender [facility] 11/25/2013, 9:03 PM

## 2013-11-26 NOTE — MAU Provider Note (Signed)
Attestation of Attending Supervision of Advanced Practitioner (CNM/NP): Evaluation and management procedures were performed by the Advanced Practitioner under my supervision and collaboration. I have reviewed the Advanced Practitioner's note and chart, and I agree with the management and plan.  Rikayla Demmon H. 5:41 AM

## 2013-11-27 ENCOUNTER — Other Ambulatory Visit (HOSPITAL_COMMUNITY)
Admission: RE | Admit: 2013-11-27 | Discharge: 2013-11-27 | Disposition: A | Discharge status: Home / Self Care | Payer: Medicaid Other | Source: Ambulatory Visit | Attending: Family Medicine | Admitting: Family Medicine

## 2013-11-27 DIAGNOSIS — Z113 Encounter for screening for infections with a predominantly sexual mode of transmission: Secondary | ICD-10-CM | POA: Insufficient documentation

## 2013-11-27 DIAGNOSIS — N760 Acute vaginitis: Secondary | ICD-10-CM | POA: Insufficient documentation

## 2013-11-28 ENCOUNTER — Telehealth (HOSPITAL_COMMUNITY): Payer: Self-pay | Admitting: Emergency Medicine

## 2013-11-28 MED ORDER — METRONIDAZOLE 500 MG PO TABS: 500.0000 mg | ORAL_TABLET | Freq: Two times a day (BID) | ORAL | Status: DC

## 2013-11-28 NOTE — ED Notes (Signed)
The patient's DNA probe came back positive for Gardnerella. She will need metronidazole 500 mg, #14, 1 twice a day for one week. This will be sent to her pharmacy. We will need to call her and let her know these results.   Reuben Likes, MD 11/28/13 [phone]

## 2013-11-29 ENCOUNTER — Emergency Department (HOSPITAL_COMMUNITY)
Admission: EM | Admit: 2013-11-29 | Discharge: 2013-11-29 | Discharge status: Absent without leave | Payer: Medicaid Other | Source: Home / Self Care

## 2013-11-29 ENCOUNTER — Telehealth (HOSPITAL_COMMUNITY): Payer: Self-pay | Admitting: *Deleted

## 2013-11-29 NOTE — ED Notes (Signed)
Pt. checked in by mistake.  I told her I had asked her Mom to have her call me back.  I verified pt. and gave her the lab results.  Pt. Told where to pick up her Rx.   Pt. instructed to no alcohol while taking this medication.  Pt. Said she can't drink anyway because she is pregnant.  I told her it was OK to take this medication while pregnant per previous discussions with doctors.

## 2013-11-29 NOTE — ED Notes (Addendum)
GC/Chlamydia neg., Affirm: Candida and Trich neg., Gardnerella pos.  Dr. Lorenz Coaster e-prescribed Flagyl to [facility] at [facility] yesterday.  I called pt. but phone message said she is not available.  I called contact and left a message for pt. to call.  Call 1. 11/29/2013 Pt. came to [facility] and checked in by mistake.  I talked to her and told her she needs Flagyl for bacterial vaginosis and where to pick up her Rx. Pt. discharged due to error.  Will notify business manager to make sure she is not charged for that. Vassie Moselle 11/29/2013

## 2013-12-03 ENCOUNTER — Telehealth: Payer: Self-pay | Admitting: *Deleted

## 2013-12-03 ENCOUNTER — Telehealth: Payer: Self-pay | Admitting: Obstetrics and Gynecology

## 2013-12-03 ENCOUNTER — Encounter (HOSPITAL_COMMUNITY): Payer: Self-pay | Admitting: Obstetrics and Gynecology

## 2013-12-03 ENCOUNTER — Ambulatory Visit (HOSPITAL_COMMUNITY)
Admission: RE | Admit: 2013-12-03 | Discharge: 2013-12-03 | Disposition: A | Discharge status: Home / Self Care | Payer: Medicaid Other | Source: Ambulatory Visit | Attending: Family | Admitting: Family

## 2013-12-03 DIAGNOSIS — O99891 Other specified diseases and conditions complicating pregnancy: Secondary | ICD-10-CM | POA: Diagnosis not present

## 2013-12-03 DIAGNOSIS — R109 Unspecified abdominal pain: Secondary | ICD-10-CM | POA: Diagnosis not present

## 2013-12-03 DIAGNOSIS — O9989 Other specified diseases and conditions complicating pregnancy, childbirth and the puerperium: Principal | ICD-10-CM

## 2013-12-03 DIAGNOSIS — O26899 Other specified pregnancy related conditions, unspecified trimester: Secondary | ICD-10-CM

## 2013-12-03 NOTE — Telephone Encounter (Addendum)
Pt called and asked about her [place] she had this am.  I asked pt what exactly she wanted to know.  Pt wanted to know how far a long she is.  I explained to the pt that the US showed that she was 6w 1d.  I asked pt if had a provider for prenatal care.  Pt stated "no".  I advised pt that she can go start care at the healthcare department.   Recalled pt and need to inform her that her US showed no embyro as of yet but it does show sac.    Per Dr. Vevelyn Pat pt will need another [place] scheduled for follow up and educate pt on ectopic precautions.

## 2013-12-03 NOTE — Telephone Encounter (Signed)
Informed patient of [place] report. Pt voiced understanding and plans to start prenatal care.

## 2013-12-03 NOTE — Telephone Encounter (Signed)
Pt left message stating that she would like her [place] results from today.  Her phone is disconnected and she can be reached @ [phone].

## 2013-12-03 NOTE — Progress Notes (Signed)
Spoke with Sarah Mayo, CNM in MAU about getting results of ultrasound to patient.  She will call patient this afternoon with results.

## 2013-12-06 NOTE — Telephone Encounter (Signed)
Called patient and discussed ultrasound with her and asked if she has started prenatal care somewhere. Patient states she has an appt with [facility] on Monday. Told patient to mention her ultrasound results to them at that appt and that she was told she needed a follow up ultrasound. Patient verbalized understanding. Told patient if she has severe pain over the weekend or starts bleeding to go to MAU for further evaluation. Patient verbalized understanding and had no other questions

## 2013-12-12 ENCOUNTER — Inpatient Hospital Stay (HOSPITAL_COMMUNITY)
Admission: AD | Admit: 2013-12-12 | Discharge: 2013-12-12 | Disposition: A | Discharge status: Home / Self Care | Payer: Medicaid Other | Source: Ambulatory Visit | Attending: Obstetrics & Gynecology | Admitting: Obstetrics & Gynecology

## 2013-12-12 ENCOUNTER — Encounter (HOSPITAL_COMMUNITY): Payer: Self-pay

## 2013-12-12 ENCOUNTER — Inpatient Hospital Stay (HOSPITAL_COMMUNITY): Payer: Medicaid Other

## 2013-12-12 DIAGNOSIS — O200 Threatened abortion: Secondary | ICD-10-CM | POA: Diagnosis not present

## 2013-12-12 DIAGNOSIS — O26859 Spotting complicating pregnancy, unspecified trimester: Secondary | ICD-10-CM | POA: Diagnosis present

## 2013-12-12 DIAGNOSIS — O209 Hemorrhage in early pregnancy, unspecified: Secondary | ICD-10-CM

## 2013-12-12 LAB — CBC WITH DIFFERENTIAL/PLATELET
Basophils Absolute: 0 10*3/uL (ref 0.0–0.1)
Basophils Relative: 0 % (ref 0–1)
Eosinophils Absolute: 0.1 10*3/uL (ref 0.0–0.7)
Eosinophils Relative: 1 % (ref 0–5)
HCT: 31.9 % — ABNORMAL LOW (ref 36.0–46.0)
Hemoglobin: 11.4 g/dL — ABNORMAL LOW (ref 12.0–15.0)
LYMPHS ABS: 3.5 10*3/uL (ref 0.7–4.0)
LYMPHS PCT: 29 % (ref 12–46)
MCH: 28.4 pg (ref 26.0–34.0)
MCHC: 35.7 g/dL (ref 30.0–36.0)
MCV: 79.4 fL (ref 78.0–100.0)
Monocytes Absolute: 0.8 10*3/uL (ref 0.1–1.0)
Monocytes Relative: 7 % (ref 3–12)
Neutro Abs: 7.7 10*3/uL (ref 1.7–7.7)
Neutrophils Relative %: 63 % (ref 43–77)
Platelets: 263 10*3/uL (ref 150–400)
RBC: 4.02 MIL/uL (ref 3.87–5.11)
RDW: 13.9 % (ref 11.5–15.5)
WBC: 12.2 10*3/uL — ABNORMAL HIGH (ref 4.0–10.5)

## 2013-12-12 LAB — URINALYSIS, ROUTINE W REFLEX MICROSCOPIC
BILIRUBIN URINE: NEGATIVE
Glucose, UA: NEGATIVE mg/dL
Ketones, ur: NEGATIVE mg/dL
Nitrite: NEGATIVE
PH: 6 (ref 5.0–8.0)
Protein, ur: NEGATIVE mg/dL
Specific Gravity, Urine: 1.02 (ref 1.005–1.030)
Urobilinogen, UA: 0.2 mg/dL (ref 0.0–1.0)

## 2013-12-12 LAB — HCG, QUANTITATIVE, PREGNANCY: hCG, Beta Chain, Quant, S: 47888 m[IU]/mL — ABNORMAL HIGH (ref <5)

## 2013-12-12 LAB — URINE MICROSCOPIC-ADD ON

## 2013-12-12 NOTE — Discharge Instructions (Signed)
Threatened Miscarriage  A threatened miscarriage is when you have vaginal bleeding during your first 20 weeks of pregnancy but the pregnancy has not ended. Your doctor will do tests to make sure you are still pregnant. The cause of the bleeding may not be known. This condition does not mean your pregnancy will end. It does increase the risk of it ending (complete miscarriage).  HOME CARE   · Make sure you keep all your doctor visits for prenatal care.  · Get plenty of rest.  · Do not have sex or use tampons if you have vaginal bleeding.  · Do not douche.  · Do not smoke or use drugs.  · Do not drink alcohol.  · Avoid caffeine.  GET HELP IF:  · You have light bleeding from your vagina.  · You have belly pain or cramping.  · You have a fever.  GET HELP RIGHT AWAY IF:   · You have heavy bleeding from your vagina.  · You have clots of blood coming from your vagina.  · You have bad pain or cramps in your low back or belly.  · You have fever, chills, and bad belly pain.  MAKE SURE YOU:   · Understand these instructions.  · Will watch your condition.  · Will get help right away if you are not doing well or get worse.  Document Released: 02/19/2008 Document Revised: 03/13/2013 Document Reviewed: 01/02/2013  ExitCare® Patient Information ©2015 [employer]. This information is not intended to replace advice given to you by your health care provider. Make sure you discuss any questions you have with your health care provider.    Pelvic Rest  Pelvic rest is sometimes recommended for women when:   · The placenta is partially or completely covering the opening of the cervix (placenta previa).  · There is bleeding between the uterine wall and the amniotic sac in the first trimester (subchorionic hemorrhage).  · The cervix begins to open without labor starting (incompetent cervix, cervical insufficiency).  · The labor is too early (preterm labor).  HOME CARE INSTRUCTIONS  · Do not have sexual intercourse, stimulation, or an  orgasm.  · Do not use tampons, douche, or put anything in the vagina.  · Do not lift anything over 10 pounds (4.5 kg).  · Avoid strenuous activity or straining your pelvic muscles.  SEEK MEDICAL CARE IF:   · You have any vaginal bleeding during pregnancy. Treat this as a potential emergency.  · You have cramping pain felt low in the stomach (stronger than menstrual cramps).  · You notice vaginal discharge (watery, mucus, or bloody).  · You have a low, dull backache.  · There are regular contractions or uterine tightening.  SEEK IMMEDIATE MEDICAL CARE IF:  You have vaginal bleeding and have placenta previa.   Document Released: 07/03/2010 Document Revised: 05/31/2011 Document Reviewed: 07/03/2010  ExitCare® Patient Information ©2015 [employer]. This information is not intended to replace advice given to you by your health care provider. Make sure you discuss any questions you have with your health care provider.

## 2013-12-12 NOTE — MAU Note (Signed)
Pt reports she noted pinkish discharge on tissue x one today. Denies pain.

## 2013-12-12 NOTE — MAU Provider Note (Signed)
History     CSN: [id number]  Arrival date and time: 12/12/13 2952   First Provider Initiated Contact with Patient 12/12/13 1957      Chief Complaint  Patient presents with  . Vaginal Bleeding   HPI Ms. Sarah Mayo is a 23 y.o. G1P0 at [redacted]w[redacted]d who presents to MAU today with complaint of spotting since 1800 today. She states that she first noted a small spot of blood in her underwear and then saw pink with wiping on the tissue. She denies abdominal pain, N/V, discharge or UTI symptoms.   OB History   Grav Para Term Preterm Abortions TAB SAB Ect Mult Living   1               Past Medical History  Diagnosis Date  . No pertinent past medical history   . Medical history non-contributory     Past Surgical History  Procedure Laterality Date  . No past surgeries      Family History  Problem Relation Age of Onset  . Asthma Brother     History  Substance Use Topics  . Smoking status: Never Smoker   . Smokeless tobacco: Not on file  . Alcohol Use: No    Allergies: No Known Allergies  No prescriptions prior to admission    Review of Systems  Constitutional: Negative for fever and malaise/fatigue.  Gastrointestinal: Negative for nausea, vomiting, abdominal pain and diarrhea.  Genitourinary: Negative for dysuria, urgency and frequency.       Neg - vaginal discharge + vaginal bleeding   Physical Exam   Blood pressure 114/51, pulse 81, temperature 98.8 F (37.1 C), temperature source Oral, resp. rate 16, height  (1.727 m), weight 176 lb (79.833 kg), last menstrual period 10/12/2013, SpO2 100.00%.  Physical Exam  Constitutional: She is oriented to person, place, and time. She appears well-developed and well-nourished. No distress.  HENT:  Head: Normocephalic.  Cardiovascular: Normal rate.   Respiratory: Effort normal.  Genitourinary: Uterus is not enlarged and not tender. Cervix exhibits no motion tenderness, no discharge and no friability. Right adnexum  displays no mass and no tenderness. Left adnexum displays no mass and no tenderness. There is bleeding (scant light brown noted) around the vagina. Vaginal discharge (scant thin, white discharge noted) found.  Cervix: closed, thick  Neurological: She is alert and oriented to person, place, and time.  Skin: Skin is warm and dry. No erythema.  Psychiatric: She has a normal mood and affect.   Results for orders placed during the hospital encounter of 12/12/13 (from the past 24 hour(s))  URINALYSIS, ROUTINE W REFLEX MICROSCOPIC     Status: Abnormal   Collection Time    12/12/13  7:40 PM      Result Value Ref Range   Color, Urine YELLOW  YELLOW   APPearance CLEAR  CLEAR   Specific Gravity, Urine 1.020  1.005 - 1.030   pH 6.0  5.0 - 8.0   Glucose, UA NEGATIVE  NEGATIVE mg/dL   Hgb urine dipstick MODERATE (*) NEGATIVE   Bilirubin Urine NEGATIVE  NEGATIVE   Ketones, ur NEGATIVE  NEGATIVE mg/dL   Protein, ur NEGATIVE  NEGATIVE mg/dL   Urobilinogen, UA 0.2  0.0 - 1.0 mg/dL   Nitrite NEGATIVE  NEGATIVE   Leukocytes, UA SMALL (*) NEGATIVE  URINE MICROSCOPIC-ADD ON     Status: None   Collection Time    12/12/13  7:40 PM      Result Value Ref Range  Squamous Epithelial / LPF RARE  RARE   WBC, UA 0-2  <3 WBC/hpf   RBC / HPF 3-6  <3 RBC/hpf   Bacteria, UA RARE  RARE   Urine-Other MUCOUS PRESENT    CBC WITH DIFFERENTIAL     Status: Abnormal   Collection Time    12/12/13  9:13 PM      Result Value Ref Range   WBC 12.2 (*) 4.0 - 10.5 K/uL   RBC 4.02  3.87 - 5.11 MIL/uL   Hemoglobin 11.4 (*) 12.0 - 15.0 g/dL   HCT 16.1 (*) 09.6 - 04.5 %   MCV 79.4  78.0 - 100.0 fL   MCH 28.4  26.0 - 34.0 pg   MCHC 35.7  30.0 - 36.0 g/dL   RDW 40.9  81.1 - 91.4 %   Platelets 263  150 - 400 K/uL   Neutrophils Relative % 63  43 - 77 %   Neutro Abs 7.7  1.7 - 7.7 K/uL   Lymphocytes Relative 29  12 - 46 %   Lymphs Abs 3.5  0.7 - 4.0 K/uL   Monocytes Relative 7  3 - 12 %   Monocytes Absolute 0.8  0.1 - 1.0  K/uL   Eosinophils Relative 1  0 - 5 %   Eosinophils Absolute 0.1  0.0 - 0.7 K/uL   Basophils Relative 0  0 - 1 %   Basophils Absolute 0.0  0.0 - 0.1 K/uL  HCG, QUANTITATIVE, PREGNANCY     Status: Abnormal   Collection Time    12/12/13  9:13 PM      Result Value Ref Range   hCG, Beta Chain, Sharene Butters, [place] [address] (*) <5 mIU/mL   US Ob Transvaginal  12/12/2013   CLINICAL DATA:  Spotting in pregnancy.  EXAM: TRANSVAGINAL OB ULTRASOUND  TECHNIQUE: Transvaginal ultrasound was performed for complete evaluation of the gestation as well as the maternal uterus, adnexal regions, and pelvic cul-de-sac.  COMPARISON:  12/03/2013  FINDINGS: Intrauterine gestational sac: Visualized/normal in shape.  Yolk sac:  Present  Embryo:  Not visible  MSD: 23.1  mm   7 w   3  d             [place] EDC: 07/28/2014  Maternal uterus/adnexae: A corpus luteum is noted on the right. This accounts for asymmetric ovarian volume, 13 cc on the right and 7 cc on the left. No subchronic hemorrhage identified. No concerning pelvic fluid.  IMPRESSION: 23 mm mean sac diameter with no visible fetus. Findings are suspicious but not definitive for failed pregnancy. Recommend follow-up US in 10-14 days for definitive diagnosis. This recommendation follows SRU consensus guidelines: Diagnostic Criteria for Nonviable Pregnancy Early in the First Trimester. Malva Limes Med 2013; 621:3086-57.   Electronically Signed   By: Tiburcio Pea M.D.   On: 12/12/2013 21:32    MAU Course  Procedures None  MDM [place] today Discussed results and possibility of failed pregnancy. Questions answered. Patient voiced understanding.   Assessment and Plan  A: IUGS and YS at [redacted]w[redacted]d Suspicious, but not definitive for failed pregnancy  P: Discharge home Bleeding precautions discussed Patient advised to keep appointment with [facility] for tomorrow  Patient may return to MAU as needed or if her condition were to change or worsen   Marny Lowenstein, PA-C  12/12/2013, 11:47 PM

## 2014-01-21 ENCOUNTER — Encounter (HOSPITAL_COMMUNITY): Payer: Self-pay

## 2014-03-22 NOTE — L&D Delivery Note (Signed)
Delivery Note At 6:02 AM a viable female "Sarah Mayo" was delivered via Vaginal, Spontaneous Delivery (Presentation: Occiput Anterior restituting to LOA).  APGARS: 8, 8; weight 7 lb 10.1 oz (3460 g).   Placenta status: Intact, Spontaneous  Cord: 3 vessels with the following complications: Marginal insertion. Cord pH: NA Placenta w/ foul smell - particulate meconium.   Anesthesia: Local for repair. Fentanyl 100 mcg IV given for comfort. Episiotomy: None Lacerations: 2nd degree Vaginal Suture Repair: 3.0 vicryl Est. Blood Loss (mL): 200. Pitocin 10 units IM given but continued to have small trickle - Pitocin infusion begun.  Mom to postpartum.  Baby to NICU due to grunting and subcostal retractions.  Mom plans to breastfeed.  Plans outpatient circumcision.  Desires Nexplanon for contraception.  Placenta to path due to foul smell & particulate meconium - chorio suspected. Mom's temp 99.4 on arrival.  IrvonaWILLIAMS, Whitesburg Arh HospitalKIMBERLY 07/23/2014, 07:00 AM

## 2014-07-22 ENCOUNTER — Inpatient Hospital Stay (HOSPITAL_COMMUNITY)
Admission: AD | Admit: 2014-07-22 | Discharge: 2014-07-22 | Disposition: A | Discharge status: Home / Self Care | Payer: Medicaid Other | Source: Ambulatory Visit | Attending: Obstetrics & Gynecology | Admitting: Obstetrics & Gynecology

## 2014-07-22 ENCOUNTER — Encounter (HOSPITAL_COMMUNITY): Payer: Self-pay | Admitting: *Deleted

## 2014-07-22 DIAGNOSIS — Z3A38 38 weeks gestation of pregnancy: Secondary | ICD-10-CM | POA: Insufficient documentation

## 2014-07-22 DIAGNOSIS — D573 Sickle-cell trait: Secondary | ICD-10-CM | POA: Diagnosis present

## 2014-07-22 DIAGNOSIS — F1290 Cannabis use, unspecified, uncomplicated: Secondary | ICD-10-CM | POA: Diagnosis present

## 2014-07-22 DIAGNOSIS — O99214 Obesity complicating childbirth: Secondary | ICD-10-CM | POA: Diagnosis present

## 2014-07-22 DIAGNOSIS — Z6832 Body mass index (BMI) 32.0-32.9, adult: Secondary | ICD-10-CM

## 2014-07-22 DIAGNOSIS — Z23 Encounter for immunization: Secondary | ICD-10-CM

## 2014-07-22 DIAGNOSIS — O99324 Drug use complicating childbirth: Secondary | ICD-10-CM | POA: Diagnosis present

## 2014-07-22 DIAGNOSIS — O4403 Placenta previa specified as without hemorrhage, third trimester: Secondary | ICD-10-CM | POA: Diagnosis present

## 2014-07-22 DIAGNOSIS — E669 Obesity, unspecified: Secondary | ICD-10-CM | POA: Diagnosis present

## 2014-07-22 DIAGNOSIS — O479 False labor, unspecified: Secondary | ICD-10-CM

## 2014-07-22 DIAGNOSIS — O9902 Anemia complicating childbirth: Secondary | ICD-10-CM | POA: Diagnosis present

## 2014-07-22 NOTE — MAU Note (Signed)
Pt presents to MAU with complaints of contractions that started 2 nights ago but got worse this am. Denies any vaginal bleeding of LOF

## 2014-07-22 NOTE — Discharge Instructions (Signed)
Braxton Hicks Contractions °Contractions of the uterus can occur throughout pregnancy. Contractions are not always a sign that you are in labor.  °WHAT ARE BRAXTON HICKS CONTRACTIONS?  °Contractions that occur before labor are called Braxton Hicks contractions, or false labor. Toward the end of pregnancy (32-34 weeks), these contractions can develop more often and may become more forceful. This is not true labor because these contractions do not result in opening (dilatation) and thinning of the cervix. They are sometimes difficult to tell apart from true labor because these contractions can be forceful and people have different pain tolerances. You should not feel embarrassed if you go to the hospital with false labor. Sometimes, the only way to tell if you are in true labor is for your health care provider to look for changes in the cervix. °If there are no prenatal problems or other health problems associated with the pregnancy, it is completely safe to be sent home with false labor and await the onset of true labor. °HOW CAN YOU TELL THE DIFFERENCE BETWEEN TRUE AND FALSE LABOR? °False Labor °· The contractions of false labor are usually shorter and not as hard as those of true labor.   °· The contractions are usually irregular.   °· The contractions are often felt in the front of the lower abdomen and in the groin.   °· The contractions may go away when you walk around or change positions while lying down.   °· The contractions get weaker and are shorter lasting as time goes on.   °· The contractions do not usually become progressively stronger, regular, and closer together as with true labor.   °True Labor °1. Contractions in true labor last 30-70 seconds, become very regular, usually become more intense, and increase in frequency.   °2. The contractions do not go away with walking.   °3. The discomfort is usually felt in the top of the uterus and spreads to the lower abdomen and low back.   °4. True labor can  be determined by your health care provider with an exam. This will show that the cervix is dilating and getting thinner.   °WHAT TO REMEMBER °· Keep up with your usual exercises and follow other instructions given by your health care provider.   °· Take medicines as directed by your health care provider.   °· Keep your regular prenatal appointments.   °· Eat and drink lightly if you think you are going into labor.   °· If Braxton Hicks contractions are making you uncomfortable:   °· Change your position from lying down or resting to walking, or from walking to resting.   °· Sit and rest in a tub of warm water.   °· Drink 2-3 glasses of water. Dehydration may cause these contractions.   °· Do slow and deep breathing several times an hour.   °WHEN SHOULD I SEEK IMMEDIATE MEDICAL CARE? °Seek immediate medical care if: °· Your contractions become stronger, more regular, and closer together.   °· You have fluid leaking or gushing from your vagina.   °· You have a fever.   °· You pass blood-tinged mucus.   °· You have vaginal bleeding.   °· You have continuous abdominal pain.   °· You have low back pain that you never had before.   °· You feel your baby's head pushing down and causing pelvic pressure.   °· Your baby is not moving as much as it used to.   °Document Released: 03/08/2005 Document Revised: 03/13/2013 Document Reviewed: 12/18/2012 °ExitCare® Patient Information ©2015 [employer]. This information is not intended to replace advice given to you by your health care   provider. Make sure you discuss any questions you have with your health care provider. ° °Fetal Movement Counts °Patient Name: __________________________________________________ Patient Due Date: ____________________ °Performing a fetal movement count is highly recommended in high-risk pregnancies, but it is good for every pregnant woman to do. Your health care provider may ask you to start counting fetal movements at 28 weeks of the pregnancy. Fetal  movements often increase: °· After eating a full meal. °· After physical activity. °· After eating or drinking something sweet or cold. °· At rest. °Pay attention to when you feel the baby is most active. This will help you notice a pattern of your baby's sleep and wake cycles and what factors contribute to an increase in fetal movement. It is important to perform a fetal movement count at the same time each day when your baby is normally most active.  °HOW TO COUNT FETAL MOVEMENTS °5. Find a quiet and comfortable area to sit or lie down on your left side. Lying on your left side provides the best blood and oxygen circulation to your baby. °6. Write down the day and time on a sheet of paper or in a journal. °7. Start counting kicks, flutters, swishes, rolls, or jabs in a 2-hour period. You should feel at least 10 movements within 2 hours. °8. If you do not feel 10 movements in 2 hours, wait 2-3 hours and count again. Look for a change in the pattern or not enough counts in 2 hours. °SEEK MEDICAL CARE IF: °· You feel less than 10 counts in 2 hours, tried twice. °· There is no movement in over an hour. °· The pattern is changing or taking longer each day to reach 10 counts in 2 hours. °· You feel the baby is not moving as he or she usually does. °Date: ____________ Movements: ____________ Start time: ____________ Finish time: ____________  °Date: ____________ Movements: ____________ Start time: ____________ Finish time: ____________ °Date: ____________ Movements: ____________ Start time: ____________ Finish time: ____________ °Date: ____________ Movements: ____________ Start time: ____________ Finish time: ____________ °Date: ____________ Movements: ____________ Start time: ____________ Finish time: ____________ °Date: ____________ Movements: ____________ Start time: ____________ Finish time: ____________ °Date: ____________ Movements: ____________ Start time: ____________ Finish time: ____________ °Date: ____________  Movements: ____________ Start time: ____________ Finish time: ____________  °Date: ____________ Movements: ____________ Start time: ____________ Finish time: ____________ °Date: ____________ Movements: ____________ Start time: ____________ Finish time: ____________ °Date: ____________ Movements: ____________ Start time: ____________ Finish time: ____________ °Date: ____________ Movements: ____________ Start time: ____________ Finish time: ____________ °Date: ____________ Movements: ____________ Start time: ____________ Finish time: ____________ °Date: ____________ Movements: ____________ Start time: ____________ Finish time: ____________ °Date: ____________ Movements: ____________ Start time: ____________ Finish time: ____________  °Date: ____________ Movements: ____________ Start time: ____________ Finish time: ____________ °Date: ____________ Movements: ____________ Start time: ____________ Finish time: ____________ °Date: ____________ Movements: ____________ Start time: ____________ Finish time: ____________ °Date: ____________ Movements: ____________ Start time: ____________ Finish time: ____________ °Date: ____________ Movements: ____________ Start time: ____________ Finish time: ____________ °Date: ____________ Movements: ____________ Start time: ____________ Finish time: ____________ °Date: ____________ Movements: ____________ Start time: ____________ Finish time: ____________  °Date: ____________ Movements: ____________ Start time: ____________ Finish time: ____________ °Date: ____________ Movements: ____________ Start time: ____________ Finish time: ____________ °Date: ____________ Movements: ____________ Start time: ____________ Finish time: ____________ °Date: ____________ Movements: ____________ Start time: ____________ Finish time: ____________ °Date: ____________ Movements: ____________ Start time: ____________ Finish time: ____________ °Date: ____________ Movements: ____________ Start time:  ____________ Finish time: ____________ °Date: ____________ Movements:   ____________ Start time: ____________ Finish time: ____________  °Date: ____________ Movements: ____________ Start time: ____________ Finish time: ____________ °Date: ____________ Movements: ____________ Start time: ____________ Finish time: ____________ °Date: ____________ Movements: ____________ Start time: ____________ Finish time: ____________ °Date: ____________ Movements: ____________ Start time: ____________ Finish time: ____________ °Date: ____________ Movements: ____________ Start time: ____________ Finish time: ____________ °Date: ____________ Movements: ____________ Start time: ____________ Finish time: ____________ °Date: ____________ Movements: ____________ Start time: ____________ Finish time: ____________  °Date: ____________ Movements: ____________ Start time: ____________ Finish time: ____________ °Date: ____________ Movements: ____________ Start time: ____________ Finish time: ____________ °Date: ____________ Movements: ____________ Start time: ____________ Finish time: ____________ °Date: ____________ Movements: ____________ Start time: ____________ Finish time: ____________ °Date: ____________ Movements: ____________ Start time: ____________ Finish time: ____________ °Date: ____________ Movements: ____________ Start time: ____________ Finish time: ____________ °Date: ____________ Movements: ____________ Start time: ____________ Finish time: ____________  °Date: ____________ Movements: ____________ Start time: ____________ Finish time: ____________ °Date: ____________ Movements: ____________ Start time: ____________ Finish time: ____________ °Date: ____________ Movements: ____________ Start time: ____________ Finish time: ____________ °Date: ____________ Movements: ____________ Start time: ____________ Finish time: ____________ °Date: ____________ Movements: ____________ Start time: ____________ Finish time: ____________ °Date:  ____________ Movements: ____________ Start time: ____________ Finish time: ____________ °Date: ____________ Movements: ____________ Start time: ____________ Finish time: ____________  °Date: ____________ Movements: ____________ Start time: ____________ Finish time: ____________ °Date: ____________ Movements: ____________ Start time: ____________ Finish time: ____________ °Date: ____________ Movements: ____________ Start time: ____________ Finish time: ____________ °Date: ____________ Movements: ____________ Start time: ____________ Finish time: ____________ °Date: ____________ Movements: ____________ Start time: ____________ Finish time: ____________ °Date: ____________ Movements: ____________ Start time: ____________ Finish time: ____________ °Document Released: 04/07/2006 Document Revised: 07/23/2013 Document Reviewed: 01/03/2012 °ExitCare® Patient Information ©2015 [employer]. This information is not intended to replace advice given to you by your health care provider. Make sure you discuss any questions you have with your health care provider. ° °

## 2014-07-22 NOTE — MAU Provider Note (Signed)
Sarah GavelJasmin D Mayo is a 24 y.o. G1P0 at 38.4 weeks presents unannounced c/o ctx that started 2 nights ago.  She deni8es vb or lof w/+FM.  Office VE 1cm, per pt.  States they ctx are 8/10 but is talking and smiling with each ctx   History     There are no active problems to display for this patient.   Chief Complaint  Patient presents with  . Contractions   HPI  OB History    Gravida Para Term Preterm AB TAB SAB Ectopic Multiple Living   1               Past Medical History  Diagnosis Date  . No pertinent past medical history   . Medical history non-contributory     Past Surgical History  Procedure Laterality Date  . No past surgeries      Family History  Problem Relation Age of Onset  . Asthma Brother     History  Substance Use Topics  . Smoking status: Never Smoker   . Smokeless tobacco: Not on file  . Alcohol Use: No    Allergies: No Known Allergies  Prescriptions prior to admission  Medication Sig Dispense Refill Last Dose  . metroNIDAZOLE (FLAGYL) 500 MG tablet Take 1 tablet (500 mg total) by mouth 2 (two) times daily. 14 tablet 0     ROS See HPI above, all other systems are negative  Physical Exam   Blood pressure 133/79, pulse 106, temperature 98.1 F (36.7 C), resp. rate 18, height 5\' 6"  (1.676 m), weight 218 lb (98.884 kg), last menstrual period 10/12/2013.  Physical Exam Ext:  WNL ABD: Soft, non tender to palpation, no rebound or guarding SVE: unchanged 1/T/H   ED Course  Assessment: IUP at  38.4weeks Membranes: intact FHR: Category 1, 150, moderate variability, + accel, no decels, ctx q5-6 mild CTX:  Q5-6 mild minutes   Plan DC to home  Pt to keep OB appointment tomorrow Labor precautions Kick counts Tylenol for pain  Lovell Nuttall, CNM, MSN 07/22/2014. 12:58 PM

## 2014-07-23 ENCOUNTER — Encounter (HOSPITAL_COMMUNITY): Payer: Self-pay | Admitting: *Deleted

## 2014-07-23 ENCOUNTER — Inpatient Hospital Stay (HOSPITAL_COMMUNITY)
Admission: AD | Admit: 2014-07-23 | Discharge: 2014-07-25 | DRG: 774 | Disposition: A | Discharge status: Home / Self Care | Payer: Medicaid Other | Source: Ambulatory Visit | Attending: Obstetrics & Gynecology | Admitting: Obstetrics & Gynecology

## 2014-07-23 DIAGNOSIS — F1291 Cannabis use, unspecified, in remission: Secondary | ICD-10-CM

## 2014-07-23 DIAGNOSIS — Z87898 Personal history of other specified conditions: Secondary | ICD-10-CM

## 2014-07-23 DIAGNOSIS — Z6834 Body mass index (BMI) 34.0-34.9, adult: Secondary | ICD-10-CM

## 2014-07-23 DIAGNOSIS — N858 Other specified noninflammatory disorders of uterus: Secondary | ICD-10-CM | POA: Diagnosis present

## 2014-07-23 DIAGNOSIS — O9902 Anemia complicating childbirth: Secondary | ICD-10-CM | POA: Diagnosis present

## 2014-07-23 DIAGNOSIS — Z3A38 38 weeks gestation of pregnancy: Secondary | ICD-10-CM | POA: Diagnosis present

## 2014-07-23 DIAGNOSIS — D573 Sickle-cell trait: Secondary | ICD-10-CM | POA: Diagnosis present

## 2014-07-23 DIAGNOSIS — Z23 Encounter for immunization: Secondary | ICD-10-CM | POA: Diagnosis not present

## 2014-07-23 DIAGNOSIS — F1290 Cannabis use, unspecified, uncomplicated: Secondary | ICD-10-CM | POA: Diagnosis present

## 2014-07-23 DIAGNOSIS — R030 Elevated blood-pressure reading, without diagnosis of hypertension: Secondary | ICD-10-CM

## 2014-07-23 DIAGNOSIS — Z6832 Body mass index (BMI) 32.0-32.9, adult: Secondary | ICD-10-CM | POA: Diagnosis not present

## 2014-07-23 DIAGNOSIS — O99214 Obesity complicating childbirth: Secondary | ICD-10-CM | POA: Diagnosis present

## 2014-07-23 DIAGNOSIS — D582 Other hemoglobinopathies: Secondary | ICD-10-CM

## 2014-07-23 DIAGNOSIS — Z6837 Body mass index (BMI) 37.0-37.9, adult: Secondary | ICD-10-CM

## 2014-07-23 DIAGNOSIS — O4403 Placenta previa specified as without hemorrhage, third trimester: Secondary | ICD-10-CM | POA: Diagnosis present

## 2014-07-23 DIAGNOSIS — E669 Obesity, unspecified: Secondary | ICD-10-CM | POA: Diagnosis present

## 2014-07-23 DIAGNOSIS — O99324 Drug use complicating childbirth: Secondary | ICD-10-CM | POA: Diagnosis present

## 2014-07-23 DIAGNOSIS — IMO0001 Reserved for inherently not codable concepts without codable children: Secondary | ICD-10-CM

## 2014-07-23 HISTORY — DX: Cannabis use, unspecified, in remission: F12.91

## 2014-07-23 HISTORY — DX: Personal history of other specified conditions: Z87.898

## 2014-07-23 HISTORY — DX: Other hemoglobinopathies: D58.2

## 2014-07-23 LAB — COMPREHENSIVE METABOLIC PANEL
ALT: 23 U/L (ref 14–54)
ANION GAP: 7 (ref 5–15)
AST: 27 U/L (ref 15–41)
Albumin: 2.9 g/dL — ABNORMAL LOW (ref 3.5–5.0)
Alkaline Phosphatase: 134 U/L — ABNORMAL HIGH (ref 38–126)
BILIRUBIN TOTAL: 0.7 mg/dL (ref 0.3–1.2)
BUN: 8 mg/dL (ref 6–20)
CHLORIDE: 109 mmol/L (ref 101–111)
CO2: 21 mmol/L — AB (ref 22–32)
Calcium: 8.5 mg/dL — ABNORMAL LOW (ref 8.9–10.3)
Creatinine, Ser: 0.63 mg/dL (ref 0.44–1.00)
GFR calc non Af Amer: >60 mL/min (ref >60)
Glucose, Bld: 129 mg/dL — ABNORMAL HIGH (ref 70–99)
POTASSIUM: 3.9 mmol/L (ref 3.5–5.1)
SODIUM: 137 mmol/L (ref 135–145)
Total Protein: 6.7 g/dL (ref 6.5–8.1)

## 2014-07-23 LAB — URIC ACID: Uric Acid, Serum: 4.1 mg/dL (ref 2.3–6.6)

## 2014-07-23 LAB — LACTATE DEHYDROGENASE: LDH: 173 U/L (ref 98–192)

## 2014-07-23 LAB — CBC
HCT: 32.3 % — ABNORMAL LOW (ref 36.0–46.0)
Hemoglobin: 11.9 g/dL — ABNORMAL LOW (ref 12.0–15.0)
MCH: 28.5 pg (ref 26.0–34.0)
MCHC: 36.1 g/dL — AB (ref 30.0–36.0)
MCV: 79.4 fL (ref 78.0–100.0)
PLATELETS: 247 10*3/uL (ref 150–400)
RBC: 4.07 MIL/uL (ref 3.87–5.11)
RDW: 13.6 % (ref 11.5–15.5)
WBC: 29 10*3/uL — ABNORMAL HIGH (ref 4.0–10.5)

## 2014-07-23 MED ORDER — OXYTOCIN 10 UNIT/ML IJ SOLN
INTRAMUSCULAR | Status: AC
  Filled 2014-07-23: qty 1

## 2014-07-23 MED ORDER — LIDOCAINE HCL (PF) 1 % IJ SOLN
INTRAMUSCULAR | Status: AC
  Filled 2014-07-23: qty 30

## 2014-07-23 MED ORDER — DIPHENHYDRAMINE HCL 25 MG PO CAPS: 25.0000 mg | ORAL_CAPSULE | Freq: Four times a day (QID) | ORAL | Status: DC | PRN

## 2014-07-23 MED ORDER — LIDOCAINE HCL (PF) 1 % IJ SOLN
30.0000 mL | Freq: Once | INTRAMUSCULAR | Status: AC
  Administered 2014-07-23: 30 mL via INTRADERMAL

## 2014-07-23 MED ORDER — TETANUS-DIPHTH-ACELL PERTUSSIS 5-2.5-18.5 LF-MCG/0.5 IM SUSP
0.5000 mL | Freq: Once | INTRAMUSCULAR | Status: AC
  Administered 2014-07-24: 0.5 mL via INTRAMUSCULAR

## 2014-07-23 MED ORDER — ACETAMINOPHEN 325 MG PO TABS: 650.0000 mg | ORAL_TABLET | ORAL | Status: DC | PRN

## 2014-07-23 MED ORDER — SENNOSIDES-DOCUSATE SODIUM 8.6-50 MG PO TABS
2.0000 | ORAL_TABLET | ORAL | Status: DC
  Administered 2014-07-23 – 2014-07-24 (×2): 2 via ORAL
  Filled 2014-07-23 (×2): qty 2

## 2014-07-23 MED ORDER — OXYTOCIN 40 UNITS IN LACTATED RINGERS INFUSION - SIMPLE MED
40.0000 m[IU]/min | Freq: Once | INTRAVENOUS | Status: AC
  Administered 2014-07-23: 40 m[IU]/min via INTRAVENOUS

## 2014-07-23 MED ORDER — FENTANYL CITRATE (PF) 100 MCG/2ML IJ SOLN
100.0000 ug | Freq: Once | INTRAMUSCULAR | Status: AC
  Administered 2014-07-23: 100 ug via INTRAVENOUS

## 2014-07-23 MED ORDER — PRENATAL MULTIVITAMIN CH
1.0000 | ORAL_TABLET | Freq: Every day | ORAL | Status: DC
  Administered 2014-07-23 – 2014-07-25 (×3): 1 via ORAL
  Filled 2014-07-23 (×3): qty 1

## 2014-07-23 MED ORDER — OXYCODONE-ACETAMINOPHEN 5-325 MG PO TABS: 2.0000 | ORAL_TABLET | ORAL | Status: DC | PRN

## 2014-07-23 MED ORDER — DIBUCAINE 1 % RE OINT: 1.0000 "application " | TOPICAL_OINTMENT | RECTAL | Status: DC | PRN

## 2014-07-23 MED ORDER — BENZOCAINE-MENTHOL 20-0.5 % EX AERO
1.0000 "application " | INHALATION_SPRAY | CUTANEOUS | Status: DC | PRN
  Filled 2014-07-23: qty 56

## 2014-07-23 MED ORDER — FENTANYL CITRATE (PF) 100 MCG/2ML IJ SOLN
INTRAMUSCULAR | Status: AC
  Filled 2014-07-23: qty 2

## 2014-07-23 MED ORDER — OXYCODONE-ACETAMINOPHEN 5-325 MG PO TABS: 1.0000 | ORAL_TABLET | ORAL | Status: DC | PRN

## 2014-07-23 MED ORDER — IBUPROFEN 600 MG PO TABS
600.0000 mg | ORAL_TABLET | Freq: Four times a day (QID) | ORAL | Status: DC
Start: 2014-07-23 — End: 2014-07-25
  Administered 2014-07-23 – 2014-07-25 (×9): 600 mg via ORAL
  Filled 2014-07-23 (×9): qty 1

## 2014-07-23 MED ORDER — ONDANSETRON HCL 4 MG/2ML IJ SOLN: 4.0000 mg | INTRAMUSCULAR | Status: DC | PRN

## 2014-07-23 MED ORDER — OXYTOCIN 10 UNIT/ML IJ SOLN
10.0000 [IU] | Freq: Once | INTRAMUSCULAR | Status: AC
  Administered 2014-07-23: 10 [IU] via INTRAMUSCULAR

## 2014-07-23 MED ORDER — OXYTOCIN 40 UNITS IN LACTATED RINGERS INFUSION - SIMPLE MED
INTRAVENOUS | Status: AC
Start: 2014-07-23 — End: 2014-07-23
  Administered 2014-07-23: 40 m[IU]/min via INTRAVENOUS
  Filled 2014-07-23: qty 1000

## 2014-07-23 MED ORDER — ZOLPIDEM TARTRATE 5 MG PO TABS: 5.0000 mg | ORAL_TABLET | Freq: Every evening | ORAL | Status: DC | PRN

## 2014-07-23 MED ORDER — WITCH HAZEL-GLYCERIN EX PADS: 1.0000 "application " | MEDICATED_PAD | CUTANEOUS | Status: DC | PRN

## 2014-07-23 MED ORDER — LANOLIN HYDROUS EX OINT: TOPICAL_OINTMENT | CUTANEOUS | Status: DC | PRN

## 2014-07-23 MED ORDER — ONDANSETRON HCL 4 MG PO TABS: 4.0000 mg | ORAL_TABLET | ORAL | Status: DC | PRN

## 2014-07-23 MED ORDER — SIMETHICONE 80 MG PO CHEW: 80.0000 mg | CHEWABLE_TABLET | ORAL | Status: DC | PRN

## 2014-07-23 NOTE — MAU Note (Signed)
Patient here via EMS with c/o SROM and contractions. Pt has urge to push. Sherre ScarletKimberly Williams CNM notified.

## 2014-07-23 NOTE — Progress Notes (Signed)
Wanita shared her birth story with me.  She is still processing all that happened, especially because it happened so quickly and she was so afraid that she would deliver at home.  Our time together got cut short, but she appears to be coping well at this time.  I will attempt follow up at a later time, but please page as needs arise.   Centex CorporationChaplain Katy Lashika Erker Pager, [phone] 3:53 PM

## 2014-07-23 NOTE — Lactation Note (Signed)
This note was copied from the chart of Sarah Renette ButtersJasmin Ryer. Lactation Consultation Note  Patient Name: Sarah Mayo WRUEA'VToday's Date: 07/23/2014 Reason for consult: Initial assessment with this mom of a term baby, in NICU now 126 hours old. This is mom's first baby. Her older sister was with her, and ws a strong advocate for breast feeding for mom. Mom has flat nipples, and pumping was uncomfortable for her. I tried 21 flanges, which seemed a better fit, but mom immediately said thye hurt more. i decreased her suction to 1 teardrop, which increased to 2 teardrops in the collection phase. Mom toerated this. [name] was able to express 2 large drop of solcotrum, which was brought to the NICU. Mom was there holding the baby, and was going to give the colosstrum to her baby, with the help of baby's nurse, Debby v.  Mom knows to call for lactation as needed.    Maternal Data    Feeding    LATCH Score/Interventions                      Lactation Tools Discussed/Used WIC Program: Yes (information faxed to [place] for DEP) Pump Review: Setup, frequency, and cleaning;Milk Storage;Other (comment) (premie setting, hand expression taught, NICU booklet reviewed) Initiated by:: clee RN IBCLC Date initiated:: 07/23/14 (at 6 hours post partum)   Consult Status Consult Status: Follow-up Date: 07/24/14 Follow-up type: In-patient    Alfred LevinsLee, Zaydn Gutridge Anne 07/23/2014, 1:52 PM

## 2014-07-23 NOTE — H&P (Signed)
Sarah GavelJasmin D Mayo is a 24 y.o. female, G1P0 at 38.5 weeks, presenting to MAU via EMS with c/o intense ctxs and urge to push. +FM. SROM @ ~ 05:00 AM, greenish color. Denies h/a, visual disturbances, RUQ pain, CP, SOB, weakness, N/V or VB.  Patient Active Problem List   Diagnosis Date Noted  . BMI 32.0-32.9,adult 07/23/2014  . Sickle cell trait 07/23/2014  . 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 Normal labor 07/23/2014  . Vaginal delivery 07/23/2014  . Second-degree perineal laceration, with delivery 07/23/2014  . Meconium stained amniotic fluid, delivered, current hospitalization 07/23/2014  . History of marijuana use 07/23/2014  . Elevated blood pressure 07/23/2014    History of present pregnancy: Patient entered care at 10.6 weeks.   EDC of 08/01/14 was established by u/s at 9.0 wks.   Anatomy scan: 18.6 weeks, with normal findings and a posterior placenta w/o previa.   Additional US evaluations:  9.0 wks for dating: Anteverted uterus. Singleton intrauterine pregnancy. Ovaries and Adnexas WNLs. No fluid in CDS. Normal fetal heart tones. 12.6 wks - first trimester screen: Singleton pregnancy. Bilateral ovaries appear normal. NT measures 1.7 mm. NB present. Cvx long and closed. Uterus heart shaped on 3D rendering. Significant prenatal events: Seen in MAU x 3 in her early first trimester for spotting w/ 3 ultrasounds each visit. ? Ovarian cyst rupture noted on u/s on 12/03/13 as moderate free fluid was noted in the CDS region. No viable IUP seen until ~ 9 wks. Started on Progesterone in 1st trimester. Treated w/ Keflex for UTI x 1. Cramping in vaginal area at 31 wks - cvx 0/0/-3; taken out of work at 34.6 wks due to heavy lifting and taxing on body; swollen ankles @ 37.5 wks - conservatory measures. Seen in MAU on 07/22/14 for ctxs - cvx 1/thick/high. TWG 44 lbs. Sickle cell and Hemoglobin C traits - FOB not tested. Last evaluation: Office on 07/16/14 @ 37.5 wks by SDJ, CNM. Cvx 1/60/-2. BP 100/60.  OB  History    Gravida Para Term Preterm AB TAB SAB Ectopic Multiple Living   1 1 1       0 1     Past Medical History  Diagnosis Date  . No pertinent past medical history   . Medical history non-contributory    Past Surgical History  Procedure Laterality Date  . No past surgeries     Family History: family history includes Asthma in her brother. Social History:  reports that she has never smoked. She does not have any smokeless tobacco history on file. She reports that she uses illicit drugs (Marijuana). She reports that she does not drink alcohol.Pt is a single [place] female w/ a 12th grade education. She works part-time as a Conservation officer, natureCashier at [employer]. She is of the [place] faith and will accept blood in an emergency. Pt is accompanied by her mother.   Prenatal Transfer Tool  Maternal Diabetes: No Genetic Screening: Normal Maternal Ultrasounds/Referrals: Normal Fetal Ultrasounds or other Referrals:  None Maternal Substance Abuse:  Yes:  Type: Marijuana Significant Maternal Medications:  Meds include: Other: PNVs Significant Maternal Lab Results: Lab values include: Group B Strep negative  TDAP: 05/02/13 Flu: 03/06/14  ROS: +FM, SROM, - VB, +Ctxs  No Known Allergies     Blood pressure 142/77, pulse 73, temperature 98.5 F (36.9 C), temperature source Oral, resp. rate 18, last menstrual period 10/12/2013, SpO2 100 %, unknown if currently breastfeeding.  Chest clear Heart RRR without murmur Abd gravid, NT, FH  CWD Pelvic: C/C/+3, cephalic EFW: 8 lbs Ext: WNL  FHR: Category 1 UCs: q 1-2 min  Prenatal labs: ABO, Rh: --/--/A POS (09/04 1845) Antibody: Neg (12/27/2013) Rubella: Normal at 89 RPR: NR (12/27/13 & 05/02/14)  HBsAg: Neg (12/27/13) HIV: Neg (12/27/13 & 05/02/14) GBS: Neg (07/01/14) Sickle cell/Hgb electrophoresis: Sickle cell trait Pap: Normal on 01/09/14 GC: Neg (01/03/14 & 07/01/14) Chlamydia: Neg (01/03/14 & 07/01/14) Genetic screenings: Neg first  trimester screen and AFP Glucola: Normal at 89 Hgb 11.3 at NOB, 11.7 at 28 weeks   Assessment: IUP at 38.5 wks 2nd stage labor Particulate meconium w/ foul smell Sickle cell trait Hemoglobin C trait Elevated BMI GBS neg H/O marijuana use   Plan: Admit for imminent delivery Routine [facility] orders Preeclampsia labs Anticipate SVD briefly  Robyne Askew, MS 07/23/2014, 07:30 AM

## 2014-07-24 ENCOUNTER — Encounter (HOSPITAL_COMMUNITY): Payer: Self-pay

## 2014-07-24 LAB — RAPID HIV SCREEN (HIV 1/2 AB+AG)
HIV 1/2 Antibodies: NONREACTIVE
HIV-1 P24 Antigen - HIV24: NONREACTIVE

## 2014-07-24 LAB — CBC
HCT: 27.4 % — ABNORMAL LOW (ref 36.0–46.0)
Hemoglobin: 9.9 g/dL — ABNORMAL LOW (ref 12.0–15.0)
MCH: 28.5 pg (ref 26.0–34.0)
MCHC: 36.1 g/dL — ABNORMAL HIGH (ref 30.0–36.0)
MCV: 79 fL (ref 78.0–100.0)
Platelets: 230 10*3/uL (ref 150–400)
RBC: 3.47 MIL/uL — ABNORMAL LOW (ref 3.87–5.11)
RDW: 13.8 % (ref 11.5–15.5)
WBC: 20.5 10*3/uL — AB (ref 4.0–10.5)

## 2014-07-24 LAB — HEPATITIS B SURFACE ANTIGEN: Hepatitis B Surface Ag: NEGATIVE

## 2014-07-24 NOTE — Lactation Note (Signed)
This note was copied from the chart of Sarah Renette ButtersJasmin Bowlds. Lactation Consultation Note  Follow up with mom.  She states she has decided not to pump and provide breast milk due to discomfort.  Mom attempted the lowest setting and still experienced pain.  Comfort gels given with instructions.  Patient Name: Sarah Mayo ZOXWR'UToday's Date: 07/24/2014     Maternal Data    Feeding Feeding Type: Formula Length of feed: 30 min  LATCH Score/Interventions                      Lactation Tools Discussed/Used     Consult Status      Huston FoleyMOULDEN, Detron Carras S 07/24/2014, 11:26 AM

## 2014-07-24 NOTE — Progress Notes (Signed)
Sarah Mayo   Subjective: Post Partum Day 1 Vaginal delivery, 2 degree laceration Patient up ad lib, denies syncope or dizziness. Reports consuming regular diet without issues and denies N/V No issues with urination and reports bleeding is appropriate  Feeding:  breast Contraceptive plan:   Nexplanon  Objective: Temp:  [98.2 F (36.8 C)-98.9 F (37.2 C)] 98.2 F (36.8 C) (05/04 1214) Pulse Rate:  [78-93] 90 (05/04 1214) Resp:  [17-18] 18 (05/04 1214) BP: (111-133)/(56-82) 112/72 mmHg (05/04 1214) SpO2:  [98 %-100 %] 98 % (05/04 1214) Weight:  [218 lb (98.884 kg)] 218 lb (98.884 kg) (05/03 2140)  Physical Exam:  General: alert and cooperative Ext: WNL, no edema. No evidence of DVT seen on physical exam. Breast: Soft filling Lungs: CTAB Heart RRR without murmur  Abdomen:  Soft, fundus firm, lochia scant, + bowel sounds, non distended, non tender Lochia: appropriate Uterine Fundus: firm Laceration: healing well    Recent Labs  07/23/14 1021 07/24/14 0523  HGB 11.9* 9.9*  HCT 32.3* 27.4*    Assessment S/P Vaginal Delivery-Day 1 Stable  Normal Involution Breastfeeding Circumcision: out patient  Plan: Continue current care Plan for discharge tomorrow, Breastfeeding and Lactation consult Lactation support   Delvon Chipps, CNM, MSN 07/24/2014, 12:22 PM
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
## 2014-07-25 LAB — PROTEIN / CREATININE RATIO, URINE
Creatinine, Urine: 100 mg/dL
PROTEIN CREATININE RATIO: 0.39 mg/mg{creat} — AB (ref 0.00–0.15)
Total Protein, Urine: 39 mg/dL

## 2014-07-25 LAB — RPR: RPR Ser Ql: NONREACTIVE

## 2014-07-25 LAB — RUBELLA SCREEN: RUBELLA: 2.38 {index} (ref >0.99)

## 2014-07-25 MED ORDER — FERROUS SULFATE 325 (65 FE) MG PO TABS: 325.0000 mg | ORAL_TABLET | Freq: Two times a day (BID) | ORAL | Status: DC

## 2014-07-25 MED ORDER — OXYCODONE-ACETAMINOPHEN 5-325 MG PO TABS: 1.0000 | ORAL_TABLET | ORAL | Status: DC | PRN

## 2014-07-25 MED ORDER — IBUPROFEN 800 MG PO TABS: 800.0000 mg | ORAL_TABLET | Freq: Three times a day (TID) | ORAL | Status: DC | PRN

## 2014-07-25 MED ORDER — DOCUSATE SODIUM 100 MG PO CAPS: 100.0000 mg | ORAL_CAPSULE | Freq: Two times a day (BID) | ORAL | Status: DC

## 2014-07-25 NOTE — Discharge Summary (Signed)
Vaginal Delivery Discharge Summary  Sarah GavelJasmin D Mayo  DOB:    12/20/1990 MRN:    [id number] CSN:    [id number]  Date of admission:                  07/23/2014  Date of discharge:                   07/25/2014  Procedures this admission:  Date of Delivery: 07/23/2014 normal spontaneous vaginal delivery In Maternity Admissions by Caryl NeverKim Williams, CNM  Newborn Data:  Live born female  Birth Weight: 7 lb 10.1 oz (3460 g) APGAR: 8, 8  Baby is in the NICU and will not go home with the mother Name: Jacquenette ShoneJulian Circumcision Plan: Outpatient  History of Present Illness:  Sarah Mayo is a 24 y.o. female, G1P1001, who presents at 10662w5d weeks gestation. [facility] She was admitted onset of labor. Her pregnancy has been complicated by:  Particulate meconium w/ foul smell Sickle cell trait Hemoglobin C trait Elevated BMI GBS neg H/O marijuana use  .  Hospital course:  The patient was admitted for precipitous labor.   Her labor was not complicated. She proceeded to have a vaginal delivery of a healthy infant. Her delivery was not complicated. Her postpartum course was not complicated. The baby was sent to the neonatal intensive care unit because of suspected chorioamnionitis. She was discharged to home on postpartum day 2 doing well.  Feeding:  bottle  Contraception:  Nexplanon  Discharge hemoglobin:  HEMOGLOBIN  Date Value Ref Range Status  07/24/2014 9.9* 12.0 - 15.0 g/dL Final   HCT  Date Value Ref Range Status  07/24/2014 27.4* 36.0 - 46.0 % Final    Discharge Physical Exam:   General: alert and no distress Lochia: appropriate Uterine Fundus: firm Incision: Not applicable DVT Evaluation: No evidence of DVT seen on physical exam.  Intrapartum Procedures: spontaneous vaginal delivery Postpartum Procedures: none Complications-Operative and Postpartum:  Second degree perineal laceration  Discharge Diagnoses: Term Pregnancy-delivered and Particulate meconium w/ foul smell, sickle cell trait, marijuana use, anemia, precipitous labor, second-degree midline laceration, obesity  Discharge Information:  Activity:           pelvic rest Diet:                routine Medications: PNV, Ibuprofen, Colace, Iron and Percocet Condition:      stable and improved Instructions:   Postpartum Care After Vaginal Delivery  After you deliver your newborn (postpartum period), the usual stay in the hospital is 24 72 hours. If there were problems with your labor or delivery, or if you have other medical problems, you might be in the hospital longer.  While you are in the hospital, you will receive help and instructions on how to care for yourself and your newborn during the postpartum period.  While you are in the hospital:  Be sure to tell your nurses if you have pain or discomfort, as well as where you feel the pain and what makes the pain worse.  If you had an incision made near your vagina (episiotomy) or if you had some tearing during delivery, the nurses may put ice packs on your episiotomy or tear. The ice packs may help to reduce the pain and swelling.  If you are breastfeeding, you may feel uncomfortable contractions of your uterus for a couple of weeks. This is  normal. The contractions help your uterus get back to normal size.  It is normal to have some bleeding after delivery.  For the first 1 3 days after delivery, the flow is red and the amount may be similar to a period.  It is common for the flow to start and stop.  In the first few days, you may pass some small clots. Let your nurses know if you begin to pass large clots or your flow increases.  Do not  flush blood clots down the toilet before having the nurse look at them.  During the next 3 10 days after delivery, your flow should become more watery and pink or brown-tinged in color.  Ten  to fourteen days after delivery, your flow should be a small amount of yellowish-white discharge.  The amount of your flow will decrease over the first few weeks after delivery. Your flow may stop in 6 8 weeks. Most women have had their flow stop by 12 weeks after delivery.  You should change your sanitary pads frequently.  Wash your hands thoroughly with soap and water for at least 20 seconds after changing pads, using the toilet, or before holding or feeding your newborn.  You should feel like you need to empty your bladder within the first 6 8 hours after delivery.  In case you become weak, lightheaded, or faint, call your nurse before you get out of bed for the first time and before you take a shower for the first time.  Within the first few days after delivery, your breasts may begin to feel tender and full. This is called engorgement. Breast tenderness usually goes away within 48 72 hours after engorgement occurs. You may also notice milk leaking from your breasts. If you are not breastfeeding, do not stimulate your breasts. Breast stimulation can make your breasts produce more milk.  Spending as much time as possible with your newborn is very important. During this time, you and your newborn can feel close and get to know each other. Having your newborn stay in your room (rooming in) will help to strengthen the bond with your newborn. It will give you time to get to know your newborn and become comfortable caring for your newborn.  Your hormones change after delivery. Sometimes the hormone changes can temporarily cause you to feel sad or tearful. These feelings should not last more than a few days. If these feelings last longer than that, you should talk to your caregiver.  If desired, talk to your caregiver about methods of family planning or contraception.  Talk to your caregiver about immunizations. Your caregiver may want you to have the following immunizations before leaving the  hospital:  Tetanus, diphtheria, and pertussis (Tdap) or tetanus and diphtheria (Td) immunization. It is very important that you and your family (including grandparents) or others caring for your newborn are up-to-date with the Tdap or Td immunizations. The Tdap or Td immunization can help protect your newborn from getting ill.  Rubella immunization.  Varicella (chickenpox) immunization.  Influenza immunization. You should receive this annual immunization if you did not receive the immunization during your pregnancy. Document Released: 01/03/2007 Document Revised: 12/01/2011 Document Reviewed: 11/03/2011 [facility] Patient Information 2014 [place].   Postpartum Depression and Baby Blues  The postpartum period begins right after the birth of a baby. During this time, there is often a great amount of joy and excitement. It is also a time of considerable changes in the life of the parent(s). Regardless  of how many times a mother gives birth, each child brings new challenges and dynamics to the family. It is not unusual to have feelings of excitement accompanied by confusing shifts in moods, emotions, and thoughts. All mothers are at risk of developing postpartum depression or the "baby blues." These mood changes can occur right after giving birth, or they may occur many months after giving birth. The baby blues or postpartum depression can be mild or severe. Additionally, postpartum depression can resolve rather quickly, or it can be a long-term condition. CAUSES Elevated hormones and their rapid decline are thought to be a main cause of postpartum depression and the baby blues. There are a number of hormones that radically change during and after pregnancy. Estrogen and progesterone usually decrease immediately after delivering your baby. The level of thyroid hormone and various cortisol steroids also rapidly drop. Other factors that play a major role in these changes include major life events  and genetics.  RISK FACTORS If you have any of the following risks for the baby blues or postpartum depression, know what symptoms to watch out for during the postpartum period. Risk factors that may increase the likelihood of getting the baby blues or postpartum depression include: 1. Havinga personal or family history of depression. 2. Having depression while being pregnant. 3. Having premenstrual or oral contraceptive-associated mood issues. 4. Having exceptional life stress. 5. Having marital conflict. 6. Lacking a social support network. 7. Having a baby with special needs. 8. Having health problems such as diabetes. SYMPTOMS Baby blues symptoms include:  Brief fluctuations in mood, such as going from extreme happiness to sadness.  Decreased concentration.  Difficulty sleeping.  Crying spells, tearfulness.  Irritability.  Anxiety. Postpartum depression symptoms typically begin within the first month after giving birth. These symptoms include:  Difficulty sleeping or excessive sleepiness.  Marked weight loss.  Agitation.  Feelings of worthlessness.  Lack of interest in activity or food. Postpartum psychosis is a very concerning condition and can be dangerous. Fortunately, it is rare. Displaying any of the following symptoms is cause for immediate medical attention. Postpartum psychosis symptoms include:  Hallucinations and delusions.  Bizarre or disorganized behavior.  Confusion or disorientation. DIAGNOSIS  A diagnosis is made by an evaluation of your symptoms. There are no medical or lab tests that lead to a diagnosis, but there are various questionnaires that a caregiver may use to identify those with the baby blues, postpartum depression, or psychosis. Often times, a screening tool called the [place] Postnatal Depression Scale is used to diagnose depression in the postpartum period.  TREATMENT The baby blues usually goes away on its own in 1 to 2 weeks. Social  support is often all that is needed. You should be encouraged to get adequate sleep and rest. Occasionally, you may be given medicines to help you sleep.  Postpartum depression requires treatment as it can last several months or longer if it is not treated. Treatment may include individual or group therapy, medicine, or both to address any social, physiological, and psychological factors that may play a role in the depression. Regular exercise, a healthy diet, rest, and social support may also be strongly recommended.  Postpartum psychosis is more serious and needs treatment right away. Hospitalization is often needed. HOME CARE INSTRUCTIONS  Get as much rest as you can. Nap when the baby sleeps.  Exercise regularly. Some women find yoga and walking to be beneficial.  Eat a balanced and nourishing diet.  Do little things that you  enjoy. Have a cup of tea, take a bubble bath, read your favorite magazine, or listen to your favorite music.  Avoid alcohol.  Ask for help with household chores, cooking, grocery shopping, or running errands as needed. Do not try to do everything.  Talk to people close to you about how you are feeling. Get support from your partner, family members, friends, or other new moms.  Try to stay positive in how you think. Think about the things you are grateful for.  Do not spend a lot of time alone.  Only take medicine as directed by your caregiver.  Keep all your postpartum appointments.  Let your caregiver know if you have any concerns. SEEK MEDICAL CARE IF: You are having a reaction or problems with your medicine. SEEK IMMEDIATE MEDICAL CARE IF:  You have suicidal feelings.  You feel you may harm the baby or someone else. Document Released: 12/11/2003 Document Revised: 05/31/2011 Document Reviewed: 01/12/2011 [facility] Patient Information 2014 [place], [place].   Discharge to: home  Follow-up Information    Follow up with [facility] In 6  weeks.   Contact information:   [address], [address] [place] [address]        Janine Limbo 07/25/2014

## 2014-07-25 NOTE — Discharge Instructions (Signed)
Postpartum Depression and Baby Blues °The postpartum period begins right after the birth of a baby. During this time, there is often a great amount of joy and excitement. It is also a time of many changes in the life of the parents. Regardless of how many times a mother gives birth, each child brings new challenges and dynamics to the family. It is not unusual to have feelings of excitement along with confusing shifts in moods, emotions, and thoughts. All mothers are at risk of developing postpartum depression or the "baby blues." These mood changes can occur right after giving birth, or they may occur many months after giving birth. The baby blues or postpartum depression can be mild or severe. Additionally, postpartum depression can go away rather quickly, or it can be a long-term condition.  °CAUSES °Raised hormone levels and the rapid drop in those levels are thought to be a main cause of postpartum depression and the baby blues. A number of hormones change during and after pregnancy. Estrogen and progesterone usually decrease right after the delivery of your baby. The levels of thyroid hormone and various cortisol steroids also rapidly drop. Other factors that play a role in these mood changes include major life events and genetics.  °RISK FACTORS °If you have any of the following risks for the baby blues or postpartum depression, know what symptoms to watch out for during the postpartum period. Risk factors that may increase the likelihood of getting the baby blues or postpartum depression include: °· Having a personal or family history of depression.   °· Having depression while being pregnant.   °· Having premenstrual mood issues or mood issues related to oral contraceptives. °· Having a lot of life stress.   °· Having marital conflict.   °· Lacking a social support network.   °· Having a baby with special needs.   °· Having health problems, such as diabetes.   °SIGNS AND SYMPTOMS °Symptoms of baby blues  include: °· Brief changes in mood, such as going from extreme happiness to sadness. °· Decreased concentration.   °· Difficulty sleeping.   °· Crying spells, tearfulness.   °· Irritability.   °· Anxiety.   °Symptoms of postpartum depression typically begin within the first month after giving birth. These symptoms include: °· Difficulty sleeping or excessive sleepiness.   °· Marked weight loss.   °· Agitation.   °· Feelings of worthlessness.   °· Lack of interest in activity or food.   °Postpartum psychosis is a very serious condition and can be dangerous. Fortunately, it is rare. Displaying any of the following symptoms is cause for immediate medical attention. Symptoms of postpartum psychosis include:  °· Hallucinations and delusions.   °· Bizarre or disorganized behavior.   °· Confusion or disorientation.   °DIAGNOSIS  °A diagnosis is made by an evaluation of your symptoms. There are no medical or lab tests that lead to a diagnosis, but there are various questionnaires that a health care provider may use to identify those with the baby blues, postpartum depression, or psychosis. Often, a screening tool called the Edinburgh Postnatal Depression Scale is used to diagnose depression in the postpartum period.  °TREATMENT °The baby blues usually goes away on its own in 1-2 weeks. Social support is often all that is needed. You will be encouraged to get adequate sleep and rest. Occasionally, you may be given medicines to help you sleep.  °Postpartum depression requires treatment because it can last several months or longer if it is not treated. Treatment may include individual or group therapy, medicine, or both to address any social, physiological, and psychological   factors that may play a role in the depression. Regular exercise, a healthy diet, rest, and social support may also be strongly recommended.  Postpartum psychosis is more serious and needs treatment right away. Hospitalization is often needed. HOME CARE  INSTRUCTIONS  Get as much rest as you can. Nap when the baby sleeps.   Exercise regularly. Some women find yoga and walking to be beneficial.   Eat a balanced and nourishing diet.   Do little things that you enjoy. Have a cup of tea, take a bubble bath, read your favorite magazine, or listen to your favorite music.  Avoid alcohol.   Ask for help with household chores, cooking, grocery shopping, or running errands as needed. Do not try to do everything.   Talk to people close to you about how you are feeling. Get support from your partner, family members, friends, or other new moms.  Try to stay positive in how you think. Think about the things you are grateful for.   Do not spend a lot of time alone.   Only take over-the-counter or prescription medicine as directed by your health care provider.  Keep all your postpartum appointments.   Let your health care provider know if you have any concerns.  SEEK MEDICAL CARE IF: You are having a reaction to or problems with your medicine. SEEK IMMEDIATE MEDICAL CARE IF:  You have suicidal feelings.   You think you may harm the baby or someone else. MAKE SURE YOU:  Understand these instructions.  Will watch your condition.  Will get help right away if you are not doing well or get worse. Document Released: 12/11/2003 Document Revised: 03/13/2013 Document Reviewed: 12/18/2012 [name] Patient Information 2015 [place], [place]. This information is not intended to replace advice given to you by your health care provider. Make sure you discuss any questions you have with your health care provider. Vaginal Delivery, Care After Refer to this sheet in the next few weeks. These discharge instructions provide you with information on caring for yourself after delivery. Your caregiver may also give you specific instructions. Your treatment has been planned according to the most current medical practices available, but problems sometimes  occur. Call your caregiver if you have any problems or questions after you go home. HOME CARE INSTRUCTIONS  Take over-the-counter or prescription medicines only as directed by your caregiver or pharmacist.  Do not drink alcohol, especially if you are breastfeeding or taking medicine to relieve pain.  Do not chew or smoke tobacco.  Do not use illegal drugs.  Continue to use good perineal care. Good perineal care includes:  Wiping your perineum from front to back.  Keeping your perineum clean.  Do not use tampons or douche until your caregiver says it is okay.  Shower, wash your hair, and take tub baths as directed by your caregiver.  Wear a well-fitting bra that provides breast support.  Eat healthy foods.  Drink enough fluids to keep your urine clear or pale yellow.  Eat high-fiber foods such as whole grain cereals and breads, brown rice, beans, and fresh fruits and vegetables every day. These foods may help prevent or relieve constipation.  Follow your caregiver's recommendations regarding resumption of activities such as climbing stairs, driving, lifting, exercising, or traveling.  Talk to your caregiver about resuming sexual activities. Resumption of sexual activities is dependent upon your risk of infection, your rate of healing, and your comfort and desire to resume sexual activity.  Try to have someone help you with  your household activities and your newborn for at least a few days after you leave the hospital.  Rest as much as possible. Try to rest or take a nap when your newborn is sleeping.  Increase your activities gradually.  Keep all of your scheduled postpartum appointments. It is very important to keep your scheduled follow-up appointments. At these appointments, your caregiver will be checking to make sure that you are healing physically and emotionally. SEEK MEDICAL CARE IF:   You are passing large clots from your vagina. Save any clots to show your  caregiver.  You have a foul smelling discharge from your vagina.  You have trouble urinating.  You are urinating frequently.  You have pain when you urinate.  You have a change in your bowel movements.  You have increasing redness, pain, or swelling near your vaginal incision (episiotomy) or vaginal tear.  You have pus draining from your episiotomy or vaginal tear.  Your episiotomy or vaginal tear is separating.  You have painful, hard, or reddened breasts.  You have a severe headache.  You have blurred vision or see spots.  You feel sad or depressed.  You have thoughts of hurting yourself or your newborn.  You have questions about your care, the care of your newborn, or medicines.  You are dizzy or light-headed.  You have a rash.  You have nausea or vomiting.  You were breastfeeding and have not had a menstrual period within 12 weeks after you stopped breastfeeding.  You are not breastfeeding and have not had a menstrual period by the 12th week after delivery.  You have a fever. SEEK IMMEDIATE MEDICAL CARE IF:   You have persistent pain.  You have chest pain.  You have shortness of breath.  You faint.  You have leg pain.  You have stomach pain.  Your vaginal bleeding saturates two or more sanitary pads in 1 hour. MAKE SURE YOU:   Understand these instructions.  Will watch your condition.  Will get help right away if you are not doing well or get worse. Document Released: 03/05/2000 Document Revised: 07/23/2013 Document Reviewed: 11/03/2011 [facility] Patient Information 2015 [place], [place]. This information is not intended to replace advice given to you by your health care provider. Make sure you discuss any questions you have with your health care provider.

## 2014-07-25 NOTE — Progress Notes (Signed)
Pt ambulated out teaching complete  

## 2015-08-08 ENCOUNTER — Emergency Department (HOSPITAL_COMMUNITY)
Admission: EM | Admit: 2015-08-08 | Discharge: 2015-08-08 | Disposition: A | Discharge status: Home / Self Care | Payer: Medicaid Other | Attending: Emergency Medicine | Admitting: Emergency Medicine

## 2015-08-08 ENCOUNTER — Encounter (HOSPITAL_COMMUNITY): Payer: Self-pay | Admitting: Emergency Medicine

## 2015-08-08 DIAGNOSIS — K0889 Other specified disorders of teeth and supporting structures: Secondary | ICD-10-CM | POA: Diagnosis not present

## 2015-08-08 DIAGNOSIS — Z79899 Other long term (current) drug therapy: Secondary | ICD-10-CM | POA: Insufficient documentation

## 2015-08-08 DIAGNOSIS — K029 Dental caries, unspecified: Secondary | ICD-10-CM

## 2015-08-08 MED ORDER — PENICILLIN V POTASSIUM 500 MG PO TABS: 500.0000 mg | ORAL_TABLET | Freq: Three times a day (TID) | ORAL | Status: DC

## 2015-08-08 MED ORDER — IBUPROFEN 800 MG PO TABS: 800.0000 mg | ORAL_TABLET | Freq: Three times a day (TID) | ORAL | Status: DC | PRN

## 2015-08-08 NOTE — Discharge Instructions (Signed)
Dental Caries °Dental caries is tooth decay. This decay can cause a hole in teeth (cavity) that can get bigger and deeper over time. °HOME CARE °· Brush and floss your teeth. Do this at least two times a day. °· Use a fluoride toothpaste. °· Use a mouth rinse if told by your dentist or doctor. °· Eat less sugary and starchy foods. Drink less sugary drinks. °· Avoid snacking often on sugary and starchy foods. Avoid sipping often on sugary drinks. °· Keep regular checkups and cleanings with your dentist. °· Use fluoride supplements if told by your dentist or doctor. °· Allow fluoride to be applied to teeth if told by your dentist or doctor. °  °This information is not intended to replace advice given to you by your health care provider. Make sure you discuss any questions you have with your health care provider. °  °Document Released: 12/16/2007 Document Revised: 03/29/2014 Document Reviewed: 03/10/2012 °Elsevier Interactive Patient Education ©2016 Elsevier Inc. ° °

## 2015-08-08 NOTE — ED Notes (Signed)
Pt. reports right upper/lower molar pain onset this morning unrelieved by OTC Tylenol.

## 2015-08-08 NOTE — ED Provider Notes (Signed)
CSN: [id number]     Arrival date & time 08/08/15  2019 History  By signing my name below, I, [facility] Center[facility]arr[facility] Washington, attest that this documentation has been prepared under the direction and in the presence of Fayrene HelperBowie Sharin Altidor, PA-C. Electronically Signed: [name], ED Scribe. 08/08/2015. 9:00 PM.   Chief Complaint  Patient presents with  . Dental Pain   The history is provided by the patient. No language interpreter was used.   HPI Comments: Sarah Mayo is a 25 y.o. female who presents to the Emergency Department complaining of constant, throbbing right lower dental pain onset this morning upon waking. Pt states that she was supposed to have 8 different teeth pulled but has not followed-up with a dentist for these extractions. Pain is worse with hot and cold exposure, chewing, eating, and palpation. She has taken Tylenol without relief. She notes similar symptoms in the past. Denies recent injuries. Denies fever, chills, rhinorrhea, cough, sneezing.  Past Medical History  Diagnosis Date  . No pertinent past medical history   . Medical history non-contributory    Past Surgical History  Procedure Laterality Date  . No past surgeries     Family History  Problem Relation Age of Onset  . Asthma Brother    Social History  Substance Use Topics  . Smoking status: Never Smoker   . Smokeless tobacco: None  . Alcohol Use: No   OB History    Gravida Para Term Preterm AB TAB SAB Ectopic Multiple Living   1 1 1       0 1     Review of Systems  Constitutional: Negative for fever and chills.  HENT: Positive for dental problem. Negative for rhinorrhea and sneezing.   Respiratory: Negative for cough.     Allergies  Review of patient's allergies indicates no known allergies.  Home Medications   Prior to Admission medications   Medication Sig Start Date End Date Taking? Authorizing Provider  docusate sodium (COLACE) 100 MG capsule Take 1 capsule (100 mg total) by mouth 2 (two)  times daily. 07/25/14   Kirkland HunArthur Stringer, MD  ferrous sulfate (FERROUSUL) 325 (65 FE) MG tablet Take 1 tablet (325 mg total) by mouth 2 (two) times daily with a meal. 07/25/14   Kirkland HunArthur Stringer, MD  ibuprofen (ADVIL,MOTRIN) 800 MG tablet Take 1 tablet (800 mg total) by mouth every 8 (eight) hours as needed for moderate pain. 08/08/15   Fayrene HelperBowie Nikoli Nasser, PA-C  oxyCODONE-acetaminophen (ROXICET) 5-325 MG per tablet Take 1 tablet by mouth every 4 (four) hours as needed for severe pain. 07/25/14   Kirkland HunArthur Stringer, MD  penicillin v potassium (VEETID) 500 MG tablet Take 1 tablet (500 mg total) by mouth 3 (three) times daily. 08/08/15   Fayrene HelperBowie Verity Gilcrest, PA-C  Prenatal Vit-Fe Fumarate-FA (PRENATAL MULTIVITAMIN) TABS tablet Take 1 tablet by mouth daily at 12 noon.    Historical Provider, MD   BP 124/55 mmHg  Pulse 96  Temp(Src) 98.4 F (36.9 C) (Oral)  Resp 18  Ht 5\' 9"  (1.753 m)  Wt 211 lb (95.709 kg)  BMI 31.15 kg/m2  SpO2 97%  LMP 08/04/2015 Physical Exam  Constitutional: She is oriented to person, place, and time. She appears well-developed and well-nourished. No distress.  HENT:  Head: Normocephalic and atraumatic.  Mouth/Throat: No trismus in the jaw.  Dental decay at tooth number 31 with TTP. No gingival erythema or signs of trismus.  Eyes: Conjunctivae are normal.  Neck: Normal range of motion.  Cardiovascular: Normal rate.  Pulmonary/Chest: Effort normal. No respiratory distress.  Musculoskeletal: Normal range of motion.  Neurological: She is alert and oriented to person, place, and time.  Skin: Skin is warm and dry.  Psychiatric: She has a normal mood and affect. Her behavior is normal.  Nursing note and vitals reviewed.   ED Course  Procedures  DIAGNOSTIC STUDIES: Oxygen Saturation is 97% on RA, normal by my interpretation.    COORDINATION OF CARE: 8:57 PM Will prescribe antibiotics and ibuprofen and discharge home. Discussed treatment plan with pt at bedside and pt agreed to plan.   MDM    Final diagnoses:  Pain due to dental caries    BP 124/55 mmHg  Pulse 96  Temp(Src) 98.4 F (36.9 C) (Oral)  Resp 18  Ht  (1.753 m)  Wt 95.709 kg  BMI 31.15 kg/m2  SpO2 97%  LMP 08/04/2015   I personally performed the services described in this documentation, which was scribed in my presence. The recorded information has been reviewed and is accurate.      Fayrene Helper, PA-C 08/08/15 2102  Donnetta Hutching, MD 08/08/15 [phone]

## 2015-08-08 NOTE — ED Notes (Signed)
Patient able to ambulate independently  

## 2016-07-06 ENCOUNTER — Encounter (HOSPITAL_COMMUNITY): Payer: Self-pay | Admitting: Obstetrics and Gynecology

## 2016-11-28 ENCOUNTER — Encounter (HOSPITAL_COMMUNITY): Payer: Self-pay

## 2016-11-28 ENCOUNTER — Inpatient Hospital Stay (HOSPITAL_COMMUNITY)
Admission: AD | Admit: 2016-11-28 | Discharge: 2016-11-28 | Disposition: A | Discharge status: Home / Self Care | Payer: Medicaid Other | Source: Ambulatory Visit | Attending: Obstetrics and Gynecology | Admitting: Obstetrics and Gynecology

## 2016-11-28 DIAGNOSIS — D573 Sickle-cell trait: Secondary | ICD-10-CM | POA: Diagnosis not present

## 2016-11-28 DIAGNOSIS — M545 Low back pain: Secondary | ICD-10-CM | POA: Diagnosis not present

## 2016-11-28 DIAGNOSIS — O9989 Other specified diseases and conditions complicating pregnancy, childbirth and the puerperium: Secondary | ICD-10-CM | POA: Diagnosis not present

## 2016-11-28 DIAGNOSIS — O0932 Supervision of pregnancy with insufficient antenatal care, second trimester: Secondary | ICD-10-CM | POA: Diagnosis not present

## 2016-11-28 DIAGNOSIS — N949 Unspecified condition associated with female genital organs and menstrual cycle: Secondary | ICD-10-CM

## 2016-11-28 DIAGNOSIS — O26892 Other specified pregnancy related conditions, second trimester: Secondary | ICD-10-CM | POA: Insufficient documentation

## 2016-11-28 DIAGNOSIS — R109 Unspecified abdominal pain: Secondary | ICD-10-CM

## 2016-11-28 DIAGNOSIS — Z3A18 18 weeks gestation of pregnancy: Secondary | ICD-10-CM | POA: Insufficient documentation

## 2016-11-28 DIAGNOSIS — O99012 Anemia complicating pregnancy, second trimester: Secondary | ICD-10-CM | POA: Diagnosis not present

## 2016-11-28 LAB — URINALYSIS, ROUTINE W REFLEX MICROSCOPIC
Bilirubin Urine: NEGATIVE
Glucose, UA: NEGATIVE mg/dL
Hgb urine dipstick: NEGATIVE
KETONES UR: NEGATIVE mg/dL
Nitrite: NEGATIVE
PH: 5 (ref 5.0–8.0)
PROTEIN: 30 mg/dL — AB
Specific Gravity, Urine: 1.029 (ref 1.005–1.030)

## 2016-11-28 LAB — POCT PREGNANCY, URINE: Preg Test, Ur: POSITIVE — AB

## 2016-11-28 NOTE — MAU Note (Addendum)
Pt c/o lower left sided pain for the last few days that comes and goes. Pt states that sometimes the pain is sharp and sometimes it feels like a cramp. Pt c/o lower back pain that has been on-going in her pregnancy. Pt denies bleeding. Pt states she hasn't started prenatal care with the pregnancy and her periods have been irregular.

## 2016-11-28 NOTE — Discharge Instructions (Signed)
Round Ligament Pain The round ligament is a cord of muscle and tissue that helps to support the uterus. It can become a source of pain during pregnancy if it becomes stretched or twisted as the baby grows. The pain usually begins in the second trimester of pregnancy, and it can come and go until the baby is delivered. It is not a serious problem, and it does not cause harm to the baby. Round ligament pain is usually a short, sharp, and pinching pain, but it can also be a dull, lingering, and aching pain. The pain is felt in the lower side of the abdomen or in the groin. It usually starts deep in the groin and moves up to the outside of the hip area. Pain can occur with:  A sudden change in position.  Rolling over in bed.  Coughing or sneezing.  Physical activity.  Follow these instructions at home: Watch your condition for any changes. Take these steps to help with your pain:  When the pain starts, relax. Then try: ? Sitting down. ? Flexing your knees up to your abdomen. ? Lying on your side with one pillow under your abdomen and another pillow between your legs. ? Sitting in a warm bath for 15-20 minutes or until the pain goes away.  Take over-the-counter and prescription medicines only as told by your health care provider.  Move slowly when you sit and stand.  Avoid long walks if they cause pain.  Stop or lessen your physical activities if they cause pain.  Contact a health care provider if:  Your pain does not go away with treatment.  You feel pain in your back that you did not have before.  Your medicine is not helping. Get help right away if:  You develop a fever or chills.  You develop uterine contractions.  You develop vaginal bleeding.  You develop nausea or vomiting.  You develop diarrhea.  You have pain when you urinate. This information is not intended to replace advice given to you by your health care provider. Make sure you discuss any questions you have  with your health care provider. Document Released: 12/16/2007 Document Revised: 08/14/2015 Document Reviewed: 05/15/2014 Elsevier Interactive Patient Education  2018 [employer] Prenatal Care Providers [facility]    [facility]  [phone]     Phone: [phone]          [facility]                      [facility]  @[place]     Phone: [phone]  Phone: [phone]         [facility]     Phone: [phone]  Phone: [phone]           [facility] [place]                hone: [phone]  Phone: [phone]         [facility]                                                 Phone: [phone]           [facility]  Phone: [phone]  [facility]   [name] [place])          Phone: [phone] [facility]   Phone: [phone]

## 2016-11-28 NOTE — MAU Provider Note (Signed)
[facility] Provider Note   CSN: [id number] Arrival date & time: 11/28/16  1854     History   Chief Complaint Chief Complaint  Patient presents with  . Abdominal Pain  . Back Pain    HPI Sarah Mayo is a 26 y.o. G2P1001  gestation who presents to the MAU with abdominal pain. The pain started about 3 days ago and has stayed the same. The pain starts in the lower abdomen and radiates to the lower back. Patient has had no prenatal care and has taken nothing for pain.   Abdominal Pain  This is a new problem. The current episode started in the past 7 days. The onset quality is gradual. The problem occurs intermittently. Duration: a few minutes. The problem has been unchanged. The pain is located in the LLQ. The pain is moderate. The quality of the pain is aching and cramping. The abdominal pain radiates to the back. Pertinent negatives include no dysuria, fever, headaches, nausea or vomiting. Nothing aggravates the pain. The pain is relieved by nothing. She has tried nothing for the symptoms.  Back Pain  This is a new problem. The current episode started in the past 7 days. The problem occurs intermittently. The problem is unchanged. The pain is present in the lumbar spine. The pain does not radiate. The pain is the same all the time. Associated symptoms include abdominal pain. Pertinent negatives include no bladder incontinence, bowel incontinence, chest pain, dysuria, fever or headaches. Risk factors include pregnancy. She has tried nothing for the symptoms.    Past Medical History:  Diagnosis Date  . Medical history non-contributory   . No pertinent past medical history     Patient Active Problem List   Diagnosis Date Noted  . BMI 32.0-32.9,adult 07/23/2014  . Sickle cell trait (HCC) 07/23/2014  . 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 Normal labor 07/23/2014  . Vaginal delivery 07/23/2014  . Second-degree perineal laceration, with delivery 07/23/2014  .  Meconium stained amniotic fluid, delivered, current hospitalization 07/23/2014  . History of marijuana use 07/23/2014  . Elevated blood pressure 07/23/2014    Past Surgical History:  Procedure Laterality Date  . NO PAST SURGERIES      OB History    Gravida Para Term Preterm AB Living   SAB TAB Ectopic Multiple Live Births         0 1       Home Medications    Prior to Admission medications   Medication Sig Start Date End Date Taking? Authorizing Provider  Prenatal Vit-Fe Fumarate-FA (PRENATAL MULTIVITAMIN) TABS tablet Take 1 tablet by mouth daily at 12 noon.   Yes [provider]  docusate sodium (COLACE) 100 MG capsule Take 1 capsule (100 mg total) by mouth 2 (two) times daily. 07/25/14   Kirkland Hun, MD  ferrous sulfate (FERROUSUL) 325 (65 FE) MG tablet Take 1 tablet (325 mg total) by mouth 2 (two) times daily with a meal. 07/25/14   Kirkland Hun, MD  ibuprofen (ADVIL,MOTRIN) 800 MG tablet Take 1 tablet (800 mg total) by mouth every 8 (eight) hours as needed for moderate pain. 08/08/15   Fayrene Helper, PA-C  oxyCODONE-acetaminophen (ROXICET) 5-325 MG per tablet Take 1 tablet by mouth every 4 (four) hours as needed for severe pain. 07/25/14   Kirkland Hun, MD  penicillin v potassium (VEETID) 500 MG tablet Take 1 tablet (500 mg total) by mouth 3 (three) times  daily. 08/08/15   Fayrene Helper, PA-C    Family History Family History  Problem Relation Age of Onset  . Asthma Brother     Social History Social History  Substance Use Topics  . Smoking status: Never Smoker  . Smokeless tobacco: Never Used  . Alcohol use Yes     Comment: Social, Last drink August 2018     Allergies   Patient has no known allergies.   Review of Systems Review of Systems  Constitutional: Negative for fever.  HENT: Negative.   Eyes: Negative for visual disturbance.  Respiratory: Negative for chest tightness and shortness of breath.   Cardiovascular: Negative for  chest pain.  Gastrointestinal: Positive for abdominal pain. Negative for bowel incontinence, nausea and vomiting.  Genitourinary: Negative for bladder incontinence and dysuria.  Musculoskeletal: Positive for back pain.  Skin: Negative for rash.  Neurological: Negative for syncope and headaches.  Psychiatric/Behavioral: Negative for confusion.     Physical Exam Updated Vital Signs BP 124/79 (BP Location: Right Arm)   Pulse (!) 101   Temp 98.4 F (36.9 C) (Oral)   Resp 18   Ht  (1.753 m)   Wt 243 lb (110.2 kg)   LMP 07/20/2016 (Within Days)   SpO2 100%   BMI 35.88 kg/m   Physical Exam  Constitutional: She is oriented to person, place, and time. She appears well-developed and well-nourished. No distress.  HENT:  Head: Normocephalic.  Eyes: EOM are normal.  Neck: Neck supple.  Cardiovascular: Regular rhythm.  Tachycardia present.   Pulmonary/Chest: Effort normal.  Abdominal: Soft. Bowel sounds are normal. There is tenderness.  Mildly tender LLQ, no rebound, no guarding.   Musculoskeletal: Normal range of motion.  Neurological: She is alert and oriented to person, place, and time.  Skin: Skin is warm and dry.  Psychiatric: She has a normal mood and affect. Her behavior is normal.  Nursing note and vitals reviewed.  Cervix long and closed  Tender over left round ligament  ED Treatments / Results  Labs (all labs ordered are listed, but only abnormal results are displayed) Results for orders placed or performed during the hospital encounter of 11/28/16 (from the past 24 hour(s))  Urinalysis, Routine w reflex microscopic     Status: Abnormal   Collection Time: 11/28/16  7:10 PM  Result Value Ref Range   Color, Urine YELLOW YELLOW   APPearance HAZY (A) CLEAR   Specific Gravity, Urine 1.029 1.005 - 1.030   pH 5.0 5.0 - 8.0   Glucose, UA NEGATIVE NEGATIVE mg/dL   Hgb urine dipstick NEGATIVE NEGATIVE   Bilirubin Urine NEGATIVE NEGATIVE   Ketones, ur NEGATIVE NEGATIVE  mg/dL   Protein, ur 30 (A) NEGATIVE mg/dL   Nitrite NEGATIVE NEGATIVE   Leukocytes, UA LARGE (A) NEGATIVE   RBC / HPF 0-5 0 - 5 RBC/hpf   WBC, UA 6-30 0 - 5 WBC/hpf   Bacteria, UA RARE (A) NONE SEEN   Squamous Epithelial / LPF 6-30 (A) NONE SEEN   Mucus PRESENT   Pregnancy, urine POC     Status: Abnormal   Collection Time: 11/28/16  7:28 PM  Result Value Ref Range   Preg Test, Ur POSITIVE (A) NEGATIVE     Radiology No results found.  Procedures Procedures (including critical care time)  Medications Ordered in ED Medications - No data to display   Initial Impression / Assessment and Plan / ED Course  I have reviewed the triage vital signs and the nursing notes.  Care turned over to Wynelle BourgeoisMarie Felicitas Sine, CNM @ 8:00pm  Final Clinical Impressions(s) / ED Diagnoses   New Prescriptions Current Discharge Medication List     Reviewed urine which has leuks but is not a good clean catch Will culture but will not treat now  Discussed RLPain and nature of it Encouraged to seek prenatal care soon List of providers given  .Encouraged to return here or to other Urgent Care/ED if she develops worsening of symptoms, increase in pain, fever, or other concerning symptoms.   Aviva SignsWilliams, Ronda Kazmi L, CNM

## 2016-11-30 LAB — CULTURE, OB URINE

## 2016-12-14 ENCOUNTER — Ambulatory Visit (HOSPITAL_COMMUNITY)
Admission: RE | Admit: 2016-12-14 | Discharge: 2016-12-14 | Disposition: A | Discharge status: Home / Self Care | Payer: Medicaid Other | Source: Ambulatory Visit | Attending: Advanced Practice Midwife | Admitting: Advanced Practice Midwife

## 2016-12-14 ENCOUNTER — Encounter (HOSPITAL_COMMUNITY): Payer: Self-pay

## 2016-12-14 ENCOUNTER — Other Ambulatory Visit (HOSPITAL_COMMUNITY): Payer: Self-pay | Admitting: *Deleted

## 2016-12-14 DIAGNOSIS — Z3A20 20 weeks gestation of pregnancy: Secondary | ICD-10-CM | POA: Diagnosis not present

## 2016-12-14 DIAGNOSIS — O0932 Supervision of pregnancy with insufficient antenatal care, second trimester: Secondary | ICD-10-CM | POA: Diagnosis not present

## 2016-12-14 DIAGNOSIS — N949 Unspecified condition associated with female genital organs and menstrual cycle: Secondary | ICD-10-CM

## 2016-12-14 DIAGNOSIS — O99212 Obesity complicating pregnancy, second trimester: Secondary | ICD-10-CM | POA: Diagnosis not present

## 2016-12-14 DIAGNOSIS — Z363 Encounter for antenatal screening for malformations: Secondary | ICD-10-CM | POA: Insufficient documentation

## 2016-12-14 DIAGNOSIS — Z3687 Encounter for antenatal screening for uncertain dates: Secondary | ICD-10-CM | POA: Insufficient documentation

## 2016-12-15 ENCOUNTER — Ambulatory Visit (INDEPENDENT_AMBULATORY_CARE_PROVIDER_SITE_OTHER): Payer: Medicaid Other | Admitting: Obstetrics & Gynecology

## 2016-12-15 ENCOUNTER — Other Ambulatory Visit (HOSPITAL_COMMUNITY)
Admission: RE | Admit: 2016-12-15 | Discharge: 2016-12-15 | Disposition: A | Discharge status: Home / Self Care | Payer: Medicaid Other | Source: Ambulatory Visit | Attending: Obstetrics & Gynecology | Admitting: Obstetrics & Gynecology

## 2016-12-15 ENCOUNTER — Encounter: Payer: Self-pay | Admitting: Obstetrics & Gynecology

## 2016-12-15 DIAGNOSIS — Z3480 Encounter for supervision of other normal pregnancy, unspecified trimester: Secondary | ICD-10-CM

## 2016-12-15 DIAGNOSIS — Z3A49 Greater than 42 weeks gestation of pregnancy: Secondary | ICD-10-CM | POA: Diagnosis not present

## 2016-12-15 DIAGNOSIS — Z3482 Encounter for supervision of other normal pregnancy, second trimester: Secondary | ICD-10-CM

## 2016-12-15 DIAGNOSIS — Z6832 Body mass index (BMI) 32.0-32.9, adult: Secondary | ICD-10-CM

## 2016-12-15 LAB — POCT URINALYSIS DIPSTICK
Bilirubin, UA: NEGATIVE
Blood, UA: NEGATIVE
Glucose, UA: NEGATIVE
KETONES UA: NEGATIVE
Leukocytes, UA: NEGATIVE
Nitrite, UA: NEGATIVE
PROTEIN UA: NEGATIVE
Spec Grav, UA: 1.02 (ref 1.010–1.025)
Urobilinogen, UA: NEGATIVE E.U./dL — AB
pH, UA: 6.5 (ref 5.0–8.0)

## 2016-12-15 NOTE — Progress Notes (Signed)
Pt states she had u/s yesterday with some kidney problems discussed. Pt has been scheduled for repeat u/s.

## 2016-12-15 NOTE — Progress Notes (Signed)
  Subjective:    Sarah Mayo is a G2P1001 [redacted]w[redacted]d being seen today for her first obstetrical visit.  Her obstetrical history is significant for obesity and late prenatal care. Patient does intend to breast feed. Pregnancy history fully reviewed.  Patient reports no complaints.  Vitals:   12/15/16 1103  BP: 98/64  Pulse: 94  Weight: 244 lb (110.7 kg)    HISTORY: OB History  Gravida Para Term Preterm AB Living  SAB TAB Ectopic Multiple Live Births        0 1    # Outcome Date GA Lbr Len/2nd Weight Sex Delivery Anes PTL Lv  2 Current           1 Term 07/23/14 [redacted]w[redacted]d 04:00 / 00:02 7 lb 10.1 oz (3.46 kg) M Vag-Spont Local  LIV     Birth Comments: large dark birth mark on R abdomen     Past Medical History:  Diagnosis Date  . Medical history non-contributory   . No pertinent past medical history   . Sickle cell trait [facility])    Past Surgical History:  Procedure Laterality Date  . NO PAST SURGERIES     Family History  Problem Relation Age of Onset  . Asthma Brother   . Sickle cell trait Mother      Exam    Uterus:   20 week  Pelvic Exam:    Perineum: No Hemorrhoids   Vulva: normal   Vagina:  normal mucosa   pH:     Cervix: no lesions   Adnexa: not evaluated   Bony Pelvis: average  System: Breast:  normal appearance, no masses or tenderness   Skin: normal coloration and turgor, no rashes    Neurologic: oriented, normal mood   Extremities: normal strength, tone, and muscle mass   HEENT extra ocular movement intact, neck supple with midline trachea and thyroid without masses   Mouth/Teeth mucous membranes moist, pharynx normal without lesions and dental hygiene good   Neck supple and no masses   Cardiovascular: regular rate and rhythm   Respiratory:  appears well, vitals normal, no respiratory distress, acyanotic, normal RR, neck free of mass or lymphadenopathy   Abdomen: obese, gravid   Urinary: urethral meatus normal      Assessment:    Pregnancy: G2P1001 Patient Active Problem List   Diagnosis Date Noted  . Supervision of other normal pregnancy, antepartum 12/15/2016  . BMI 32.0-32.9,adult 07/23/2014  . Sickle cell trait (HCC) 07/23/2014  . 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 History of marijuana use 07/23/2014        Plan:     Initial labs drawn. Prenatal vitamins. Problem list reviewed and updated. Genetic Screening discussed Quad Screen: ordered.  Ultrasound discussed; fetal survey: results reviewed.  Follow up in 4 weeks. 50% of 30 min visit spent on counseling and coordination of care.     Scheryl Darter 12/15/2016

## 2016-12-15 NOTE — Patient Instructions (Signed)

## 2016-12-16 ENCOUNTER — Encounter: Payer: Self-pay | Admitting: Advanced Practice Midwife

## 2016-12-16 DIAGNOSIS — O35EXX0 Maternal care for other (suspected) fetal abnormality and damage, fetal genitourinary anomalies, not applicable or unspecified: Secondary | ICD-10-CM | POA: Insufficient documentation

## 2016-12-16 DIAGNOSIS — O358XX0 Maternal care for other (suspected) fetal abnormality and damage, not applicable or unspecified: Secondary | ICD-10-CM | POA: Insufficient documentation

## 2016-12-17 LAB — PRENATAL PROFILE I(LABCORP)
Antibody Screen: NEGATIVE
BASOS ABS: 0 10*3/uL (ref 0.0–0.2)
BASOS: 0 %
EOS (ABSOLUTE): 0 10*3/uL (ref 0.0–0.4)
Eos: 0 %
HEMATOCRIT: 34.8 % (ref 34.0–46.6)
HEMOGLOBIN: 11.4 g/dL (ref 11.1–15.9)
Hepatitis B Surface Ag: NEGATIVE
Immature Grans (Abs): 0 10*3/uL (ref 0.0–0.1)
Immature Granulocytes: 0 %
LYMPHS ABS: 2 10*3/uL (ref 0.7–3.1)
Lymphs: 17 %
MCH: 26.6 pg (ref 26.6–33.0)
MCHC: 32.8 g/dL (ref 31.5–35.7)
MCV: 81 fL (ref 79–97)
MONOCYTES: 3 %
MONOS ABS: 0.3 10*3/uL (ref 0.1–0.9)
Neutrophils Absolute: 9.1 10*3/uL — ABNORMAL HIGH (ref 1.4–7.0)
Neutrophils: 80 %
PLATELETS: 294 10*3/uL (ref 150–379)
RBC: 4.29 x10E6/uL (ref 3.77–5.28)
RDW: 15.1 % (ref 12.3–15.4)
RPR Ser Ql: NONREACTIVE
RUBELLA: 2.82 {index} (ref >0.99)
Rh Factor: POSITIVE
WBC: 11.5 10*3/uL — ABNORMAL HIGH (ref 3.4–10.8)

## 2016-12-17 LAB — CULTURE, OB URINE

## 2016-12-17 LAB — CYTOLOGY - PAP
Adequacy: ABSENT
CHLAMYDIA, DNA PROBE: NEGATIVE
Diagnosis: NEGATIVE
Neisseria Gonorrhea: NEGATIVE

## 2016-12-17 LAB — URINE CULTURE, OB REFLEX

## 2016-12-17 LAB — HIV ANTIBODY (ROUTINE TESTING W REFLEX): HIV Screen 4th Generation wRfx: NONREACTIVE

## 2016-12-20 LAB — AFP TETRA
DIA Mom Value: 0.65
DIA VALUE (EIA): 101.03 pg/mL
DSR (By Age)    1 IN: 1031
DSR (SECOND TRIMESTER) 1 IN: 10000
GESTATIONAL AGE AFP: 20.4 wk
MSAFP Mom: 0.96
MSAFP: 46.5 ng/mL
MSHCG Mom: 0.89
MSHCG: 15233 m[IU]/mL
Maternal Age At EDD: 25 yr
Osb Risk: 10000
Test Results:: NEGATIVE
WEIGHT: 244 [lb_av]
uE3 Mom: 1.52
uE3 Value: 2.55 ng/mL

## 2017-01-11 ENCOUNTER — Encounter (HOSPITAL_COMMUNITY): Payer: Self-pay

## 2017-01-11 ENCOUNTER — Ambulatory Visit (HOSPITAL_COMMUNITY)
Admission: RE | Admit: 2017-01-11 | Discharge: 2017-01-11 | Disposition: A | Discharge status: Home / Self Care | Payer: Medicaid Other | Source: Ambulatory Visit | Attending: Certified Nurse Midwife | Admitting: Certified Nurse Midwife

## 2017-01-11 ENCOUNTER — Other Ambulatory Visit (HOSPITAL_COMMUNITY): Payer: Self-pay | Admitting: Maternal and Fetal Medicine

## 2017-01-11 ENCOUNTER — Ambulatory Visit (INDEPENDENT_AMBULATORY_CARE_PROVIDER_SITE_OTHER): Payer: Medicaid Other | Admitting: Obstetrics and Gynecology

## 2017-01-11 ENCOUNTER — Other Ambulatory Visit (HOSPITAL_COMMUNITY): Payer: Self-pay | Admitting: *Deleted

## 2017-01-11 VITALS — BP 120/79 | HR 91 | Wt 249.0 lb

## 2017-01-11 DIAGNOSIS — Z362 Encounter for other antenatal screening follow-up: Secondary | ICD-10-CM

## 2017-01-11 DIAGNOSIS — O99212 Obesity complicating pregnancy, second trimester: Secondary | ICD-10-CM

## 2017-01-11 DIAGNOSIS — Z3480 Encounter for supervision of other normal pregnancy, unspecified trimester: Secondary | ICD-10-CM

## 2017-01-11 DIAGNOSIS — Z23 Encounter for immunization: Secondary | ICD-10-CM

## 2017-01-11 DIAGNOSIS — O99210 Obesity complicating pregnancy, unspecified trimester: Secondary | ICD-10-CM

## 2017-01-11 DIAGNOSIS — E669 Obesity, unspecified: Secondary | ICD-10-CM

## 2017-01-11 DIAGNOSIS — Z3A24 24 weeks gestation of pregnancy: Secondary | ICD-10-CM

## 2017-01-11 DIAGNOSIS — Z3482 Encounter for supervision of other normal pregnancy, second trimester: Secondary | ICD-10-CM

## 2017-01-11 DIAGNOSIS — O0930 Supervision of pregnancy with insufficient antenatal care, unspecified trimester: Secondary | ICD-10-CM

## 2017-01-11 DIAGNOSIS — O0932 Supervision of pregnancy with insufficient antenatal care, second trimester: Secondary | ICD-10-CM

## 2017-01-11 DIAGNOSIS — O99012 Anemia complicating pregnancy, second trimester: Secondary | ICD-10-CM

## 2017-01-11 DIAGNOSIS — D573 Sickle-cell trait: Secondary | ICD-10-CM

## 2017-01-11 DIAGNOSIS — Z6832 Body mass index (BMI) 32.0-32.9, adult: Secondary | ICD-10-CM

## 2017-01-11 NOTE — Progress Notes (Signed)
   PRENATAL VISIT NOTE  Subjective:  Sarah Mayo is a 26 y.o. G2P1001 at 298w3d being seen today for ongoing prenatal care.  She is currently monitored for the following issues for this low-risk pregnancy and has BMI 32.0-32.9,adult; Sickle cell trait (HCC); Hemoglobin C trait (HCC); History of marijuana use; Supervision of other normal pregnancy, antepartum; and Encounter for repeat ultrasound of fetal pyelectasis in singleton pregnancy, antepartum on her problem list.  Patient reports no complaints.  Contractions: Not present. Vag. Bleeding: None.  Movement: Present. Denies leaking of fluid.   The following portions of the patient's history were reviewed and updated as appropriate: allergies, current medications, past family history, past medical history, past social history, past surgical history and problem list. Problem list updated.  Objective:   Vitals:   01/11/17 1401  BP: 120/79  Pulse: 91  Weight: 249 lb (112.9 kg)    Fetal Status: Fetal Heart Rate (bpm): 154 Fundal Height: 25 cm Movement: Present     General:  Alert, oriented and cooperative. Patient is in no acute distress.  Skin: Skin is warm and dry. No rash noted.   Cardiovascular: Normal heart rate noted  Respiratory: Normal respiratory effort, no problems with respiration noted  Abdomen: Soft, gravid, appropriate for gestational age.  Pain/Pressure: Absent     Pelvic: Cervical exam deferred        Extremities: Normal range of motion.  Edema: Trace  Mental Status:  Normal mood and affect. Normal behavior. Normal judgment and thought content.   Assessment and Plan:  Pregnancy: G2P1001 at 628w3d  1. Supervision of other normal pregnancy, antepartum Patient is doing well without complaints Reviewed ultrasound reports Plan for follow up ultrasound in December Flu vaccine today Third trimester labs, glucola and tdap at next visit  2. BMI 32.0-32.9,adult Discussed excessive weight gain thus far Reviewed  nutrition during pregnancy and the need to incorporate exercise  Preterm labor symptoms and general obstetric precautions including but not limited to vaginal bleeding, contractions, leaking of fluid and fetal movement were reviewed in detail with the patient. Please refer to After Visit Summary for other counseling recommendations.  Return in about 4 weeks (around 02/08/2017) for ROB, 2 hr glucola next visit.   Catalina AntiguaPeggy Livingston Denner, MD

## 2017-01-11 NOTE — Patient Instructions (Signed)

## 2017-01-12 ENCOUNTER — Encounter: Payer: Self-pay | Admitting: Family Medicine

## 2017-01-12 DIAGNOSIS — O99213 Obesity complicating pregnancy, third trimester: Secondary | ICD-10-CM | POA: Insufficient documentation

## 2017-01-12 DIAGNOSIS — O359XX0 Maternal care for (suspected) fetal abnormality and damage, unspecified, not applicable or unspecified: Secondary | ICD-10-CM | POA: Insufficient documentation

## 2017-02-08 ENCOUNTER — Ambulatory Visit (INDEPENDENT_AMBULATORY_CARE_PROVIDER_SITE_OTHER): Payer: Medicaid Other | Admitting: Obstetrics and Gynecology

## 2017-02-08 ENCOUNTER — Other Ambulatory Visit: Payer: Medicaid Other

## 2017-02-08 ENCOUNTER — Encounter: Payer: Self-pay | Admitting: Obstetrics and Gynecology

## 2017-02-08 VITALS — BP 127/80 | HR 102 | Wt 249.2 lb

## 2017-02-08 DIAGNOSIS — Z3480 Encounter for supervision of other normal pregnancy, unspecified trimester: Secondary | ICD-10-CM

## 2017-02-08 DIAGNOSIS — O35EXX0 Maternal care for other (suspected) fetal abnormality and damage, fetal genitourinary anomalies, not applicable or unspecified: Secondary | ICD-10-CM

## 2017-02-08 DIAGNOSIS — Z23 Encounter for immunization: Secondary | ICD-10-CM

## 2017-02-08 DIAGNOSIS — O358XX0 Maternal care for other (suspected) fetal abnormality and damage, not applicable or unspecified: Secondary | ICD-10-CM

## 2017-02-08 DIAGNOSIS — Z3483 Encounter for supervision of other normal pregnancy, third trimester: Secondary | ICD-10-CM

## 2017-02-08 NOTE — Progress Notes (Signed)
Patient reports good fetal movement, denies pain. 

## 2017-02-08 NOTE — Patient Instructions (Signed)
Third Trimester of Pregnancy The third trimester is from week 28 through week 40 (months 7 through 9). The third trimester is a time when the unborn baby (fetus) is growing rapidly. At the end of the ninth month, the fetus is about 20 inches in length and weighs 6-10 pounds. Body changes during your third trimester Your body will continue to go through many changes during pregnancy. The changes vary from woman to woman. During the third trimester:  Your weight will continue to increase. You can expect to gain 25-35 pounds (11-16 kg) by the end of the pregnancy.  You may begin to get stretch marks on your hips, abdomen, and breasts.  You may urinate more often because the fetus is moving lower into your pelvis and pressing on your bladder.  You may develop or continue to have heartburn. This is caused by increased hormones that slow down muscles in the digestive tract.  You may develop or continue to have constipation because increased hormones slow digestion and cause the muscles that push waste through your intestines to relax.  You may develop hemorrhoids. These are swollen veins (varicose veins) in the rectum that can itch or be painful.  You may develop swollen, bulging veins (varicose veins) in your legs.  You may have increased body aches in the pelvis, back, or thighs. This is due to weight gain and increased hormones that are relaxing your joints.  You may have changes in your hair. These can include thickening of your hair, rapid growth, and changes in texture. Some women also have hair loss during or after pregnancy, or hair that feels dry or thin. Your hair will most likely return to normal after your baby is born.  Your breasts will continue to grow and they will continue to become tender. A yellow fluid (colostrum) may leak from your breasts. This is the first milk you are producing for your baby.  Your belly button may stick out.  You may notice more swelling in your hands,  face, or ankles.  You may have increased tingling or numbness in your hands, arms, and legs. The skin on your belly may also feel numb.  You may feel short of breath because of your expanding uterus.  You may have more problems sleeping. This can be caused by the size of your belly, increased need to urinate, and an increase in your body's metabolism.  You may notice the fetus "dropping," or moving lower in your abdomen (lightening).  You may have increased vaginal discharge.  You may notice your joints feel loose and you may have pain around your pelvic bone.  What to expect at prenatal visits You will have prenatal exams every 2 weeks until week 36. Then you will have weekly prenatal exams. During a routine prenatal visit:  You will be weighed to make sure you and the baby are growing normally.  Your blood pressure will be taken.  Your abdomen will be measured to track your baby's growth.  The fetal heartbeat will be listened to.  Any test results from the previous visit will be discussed.  You may have a cervical check near your due date to see if your cervix has softened or thinned (effaced).  You will be tested for Group B streptococcus. This happens between 35 and 37 weeks.  Your health care provider may ask you:  What your birth plan is.  How you are feeling.  If you are feeling the baby move.  If you have had   any abnormal symptoms, such as leaking fluid, bleeding, severe headaches, or abdominal cramping.  If you are using any tobacco products, including cigarettes, chewing tobacco, and electronic cigarettes.  If you have any questions.  Other tests or screenings that may be performed during your third trimester include:  Blood tests that check for low iron levels (anemia).  Fetal testing to check the health, activity level, and growth of the fetus. Testing is done if you have certain medical conditions or if there are problems during the  pregnancy.  Nonstress test (NST). This test checks the health of your baby to make sure there are no signs of problems, such as the baby not getting enough oxygen. During this test, a belt is placed around your belly. The baby is made to move, and its heart rate is monitored during movement.  What is false labor? False labor is a condition in which you feel small, irregular tightenings of the muscles in the womb (contractions) that usually go away with rest, changing position, or drinking water. These are called Braxton Hicks contractions. Contractions may last for hours, days, or even weeks before true labor sets in. If contractions come at regular intervals, become more frequent, increase in intensity, or become painful, you should see your health care provider. What are the signs of labor?  Abdominal cramps.  Regular contractions that start at 10 minutes apart and become stronger and more frequent with time.  Contractions that start on the top of the uterus and spread down to the lower abdomen and back.  Increased pelvic pressure and dull back pain.  A watery or bloody mucus discharge that comes from the vagina.  Leaking of amniotic fluid. This is also known as your "water breaking." It could be a slow trickle or a gush. Let your health care provider know if it has a color or strange odor. If you have any of these signs, call your health care provider right away, even if it is before your due date. Follow these instructions at home: Medicines  Follow your health care provider's instructions regarding medicine use. Specific medicines may be either safe or unsafe to take during pregnancy.  Take a prenatal vitamin that contains at least 600 micrograms (mcg) of folic acid.  If you develop constipation, try taking a stool softener if your health care provider approves. Eating and drinking  Eat a balanced diet that includes fresh fruits and vegetables, whole grains, good sources of protein  such as meat, eggs, or tofu, and low-fat dairy. Your health care provider will help you determine the amount of weight gain that is right for you.  Avoid raw meat and uncooked cheese. These carry germs that can cause birth defects in the baby.  If you have low calcium intake from food, talk to your health care provider about whether you should take a daily calcium supplement.  Eat four or five small meals rather than three large meals a day.  Limit foods that are high in fat and processed sugars, such as fried and sweet foods.  To prevent constipation: ? Drink enough fluid to keep your urine clear or pale yellow. ? Eat foods that are high in fiber, such as fresh fruits and vegetables, whole grains, and beans. Activity  Exercise only as directed by your health care provider. Most women can continue their usual exercise routine during pregnancy. Try to exercise for 30 minutes at least 5 days a week. Stop exercising if you experience uterine contractions.  Avoid heavy   lifting.  Do not exercise in extreme heat or humidity, or at high altitudes.  Wear low-heel, comfortable shoes.  Practice good posture.  You may continue to have sex unless your health care provider tells you otherwise. Relieving pain and discomfort  Take frequent breaks and rest with your legs elevated if you have leg cramps or low back pain.  Take warm sitz baths to soothe any pain or discomfort caused by hemorrhoids. Use hemorrhoid cream if your health care provider approves.  Wear a good support bra to prevent discomfort from breast tenderness.  If you develop varicose veins: ? Wear support pantyhose or compression stockings as told by your healthcare provider. ? Elevate your feet for 15 minutes, 3-4 times a day. Prenatal care  Write down your questions. Take them to your prenatal visits.  Keep all your prenatal visits as told by your health care provider. This is important. Safety  Wear your seat belt at  all times when driving.  Make a list of emergency phone numbers, including numbers for family, friends, the hospital, and police and fire departments. General instructions  Avoid cat litter boxes and soil used by cats. These carry germs that can cause birth defects in the baby. If you have a cat, ask someone to clean the litter box for you.  Do not travel far distances unless it is absolutely necessary and only with the approval of your health care provider.  Do not use hot tubs, steam rooms, or saunas.  Do not drink alcohol.  Do not use any products that contain nicotine or tobacco, such as cigarettes and e-cigarettes. If you need help quitting, ask your health care provider.  Do not use any medicinal herbs or unprescribed drugs. These chemicals affect the formation and growth of the baby.  Do not douche or use tampons or scented sanitary pads.  Do not cross your legs for long periods of time.  To prepare for the arrival of your baby: ? Take prenatal classes to understand, practice, and ask questions about labor and delivery. ? Make a trial run to the hospital. ? Visit the hospital and tour the maternity area. ? Arrange for maternity or paternity leave through employers. ? Arrange for family and friends to take care of pets while you are in the hospital. ? Purchase a rear-facing car seat and make sure you know how to install it in your car. ? Pack your hospital bag. ? Prepare the baby's nursery. Make sure to remove all pillows and stuffed animals from the baby's crib to prevent suffocation.  Visit your dentist if you have not gone during your pregnancy. Use a soft toothbrush to brush your teeth and be gentle when you floss. Contact a health care provider if:  You are unsure if you are in labor or if your water has broken.  You become dizzy.  You have mild pelvic cramps, pelvic pressure, or nagging pain in your abdominal area.  You have lower back pain.  You have persistent  nausea, vomiting, or diarrhea.  You have an unusual or bad smelling vaginal discharge.  You have pain when you urinate. Get help right away if:  Your water breaks before 37 weeks.  You have regular contractions less than 5 minutes apart before 37 weeks.  You have a fever.  You are leaking fluid from your vagina.  You have spotting or bleeding from your vagina.  You have severe abdominal pain or cramping.  You have rapid weight loss or weight gain.    You have shortness of breath with chest pain.  You notice sudden or extreme swelling of your face, hands, ankles, feet, or legs.  Your baby makes fewer than 10 movements in 2 hours.  You have severe headaches that do not go away when you take medicine.  You have vision changes. Summary  The third trimester is from week 28 through week 40, months 7 through 9. The third trimester is a time when the unborn baby (fetus) is growing rapidly.  During the third trimester, your discomfort may increase as you and your baby continue to gain weight. You may have abdominal, leg, and back pain, sleeping problems, and an increased need to urinate.  During the third trimester your breasts will keep growing and they will continue to become tender. A yellow fluid (colostrum) may leak from your breasts. This is the first milk you are producing for your baby.  False labor is a condition in which you feel small, irregular tightenings of the muscles in the womb (contractions) that eventually go away. These are called Braxton Hicks contractions. Contractions may last for hours, days, or even weeks before true labor sets in.  Signs of labor can include: abdominal cramps; regular contractions that start at 10 minutes apart and become stronger and more frequent with time; watery or bloody mucus discharge that comes from the vagina; increased pelvic pressure and dull back pain; and leaking of amniotic fluid. This information is not intended to replace advice  given to you by your health care provider. Make sure you discuss any questions you have with your health care provider. Document Released: 03/02/2001 Document Revised: 08/14/2015 Document Reviewed: 05/09/2012 [employer] Interactive Patient Education  2017 [employer]  

## 2017-02-08 NOTE — Addendum Note (Signed)
Addended by: Dalphine HandingGARDNER, Tasha Jindra L on: 02/08/2017 10:45 AM   Modules accepted: Orders

## 2017-02-08 NOTE — Progress Notes (Signed)
Subjective:  Joanne GavelJasmin D Seider is a 26 y.o. G2P1001 at 5478w3d being seen today for ongoing prenatal care.  She is currently monitored for the following issues for this high-risk pregnancy and has BMI 32.0-32.9,adult; Sickle cell trait (HCC); Hemoglobin C trait (HCC); History of marijuana use; Supervision of other normal pregnancy, antepartum; Encounter for repeat ultrasound of fetal pyelectasis in singleton pregnancy, antepartum; Obesity affecting pregnancy, antepartum; and Fetal abnormality during pregnancy, antepartum on their problem list.  Patient reports no complaints.  Contractions: Not present. Vag. Bleeding: None.  Movement: Present. Denies leaking of fluid.   The following portions of the patient's history were reviewed and updated as appropriate: allergies, current medications, past family history, past medical history, past social history, past surgical history and problem list. Problem list updated.  Objective:   Vitals:   02/08/17 0951  BP: 127/80  Pulse: (!) 102  Weight: 249 lb 3.2 oz (113 kg)    Fetal Status: Fetal Heart Rate (bpm): 152   Movement: Present     General:  Alert, oriented and cooperative. Patient is in no acute distress.  Skin: Skin is warm and dry. No rash noted.   Cardiovascular: Normal heart rate noted  Respiratory: Normal respiratory effort, no problems with respiration noted  Abdomen: Soft, gravid, appropriate for gestational age. Pain/Pressure: Absent     Pelvic:  Cervical exam deferred        Extremities: Normal range of motion.  Edema: Trace  Mental Status: Normal mood and affect. Normal behavior. Normal judgment and thought content.   Urinalysis:      Assessment and Plan:  Pregnancy: G2P1001 at 6778w3d  1. Supervision of other normal pregnancy, antepartum Stable - Glucose Tolerance, 2 Hours w/1 Hour - HIV antibody (with reflex) - CBC - RPR Declines Flu vaccine Tdap vaccine today  2. Encounter for repeat ultrasound of fetal pyelectasis in  singleton pregnancy, antepartum F/U U/S scheduled  Preterm labor symptoms and general obstetric precautions including but not limited to vaginal bleeding, contractions, leaking of fluid and fetal movement were reviewed in detail with the patient. Please refer to After Visit Summary for other counseling recommendations.  Return in about 2 weeks (around 02/22/2017) for OB visit.   Hermina StaggersErvin, Michael L, MD

## 2017-02-09 LAB — HIV ANTIBODY (ROUTINE TESTING W REFLEX): HIV Screen 4th Generation wRfx: NONREACTIVE

## 2017-02-09 LAB — GLUCOSE TOLERANCE, 2 HOURS W/ 1HR
GLUCOSE, 1 HOUR: 158 mg/dL (ref 65–179)
Glucose, 2 hour: 143 mg/dL (ref 65–152)
Glucose, Fasting: 94 mg/dL — ABNORMAL HIGH (ref 65–91)

## 2017-02-09 LAB — CBC
HEMATOCRIT: 34.5 % (ref 34.0–46.6)
Hemoglobin: 11.8 g/dL (ref 11.1–15.9)
MCH: 27.4 pg (ref 26.6–33.0)
MCHC: 34.2 g/dL (ref 31.5–35.7)
MCV: 80 fL (ref 79–97)
PLATELETS: 278 10*3/uL (ref 150–379)
RBC: 4.31 x10E6/uL (ref 3.77–5.28)
RDW: 15.9 % — ABNORMAL HIGH (ref 12.3–15.4)
WBC: 12.4 10*3/uL — AB (ref 3.4–10.8)

## 2017-02-09 LAB — RPR: RPR Ser Ql: NONREACTIVE

## 2017-02-14 ENCOUNTER — Telehealth: Payer: Self-pay

## 2017-02-14 ENCOUNTER — Other Ambulatory Visit: Payer: Self-pay

## 2017-02-14 DIAGNOSIS — O24419 Gestational diabetes mellitus in pregnancy, unspecified control: Secondary | ICD-10-CM

## 2017-02-14 MED ORDER — GLUCOSE BLOOD VI STRP: ORAL_STRIP | 12 refills | Status: DC

## 2017-02-14 MED ORDER — ACCU-CHEK FASTCLIX LANCETS MISC: 1.0000 | Freq: Four times a day (QID) | 12 refills | Status: DC

## 2017-02-14 MED ORDER — ACCU-CHEK GUIDE W/DEVICE KIT: 1.0000 | PACK | Freq: Four times a day (QID) | 0 refills | Status: DC

## 2017-02-14 NOTE — Telephone Encounter (Signed)
-----   Message from Hermina StaggersMichael L Ervin, MD sent at 02/10/2017  4:34 PM EST ----- Please let pt know that she has GDM Please refer for diabetic education Please send diabetic supplies to pharmacy Thanks Casimiro NeedleMichael

## 2017-02-14 NOTE — Telephone Encounter (Signed)
Left message with mother to have her call us.

## 2017-02-17 ENCOUNTER — Telehealth: Payer: Self-pay

## 2017-02-17 NOTE — Telephone Encounter (Signed)
Patient's telephone is temporarily not accepting calls,  Unable to leave message.

## 2017-02-17 NOTE — Telephone Encounter (Signed)
-----   Message from Michael L Ervin, MD sent at 02/10/2017  4:34 PM EST ----- Please let pt know that she has GDM Please refer for diabetic education Please send diabetic supplies to pharmacy Thanks Michael 

## 2017-02-22 ENCOUNTER — Encounter: Payer: Medicaid Other | Admitting: Certified Nurse Midwife

## 2017-02-28 ENCOUNTER — Encounter: Payer: Medicaid Other | Admitting: Certified Nurse Midwife

## 2017-03-01 ENCOUNTER — Encounter (HOSPITAL_COMMUNITY): Payer: Self-pay

## 2017-03-01 ENCOUNTER — Other Ambulatory Visit (HOSPITAL_COMMUNITY): Payer: Self-pay | Admitting: *Deleted

## 2017-03-01 ENCOUNTER — Ambulatory Visit (HOSPITAL_COMMUNITY)
Admission: RE | Admit: 2017-03-01 | Discharge: 2017-03-01 | Disposition: A | Discharge status: Home / Self Care | Payer: Medicaid Other | Source: Ambulatory Visit | Attending: Certified Nurse Midwife | Admitting: Certified Nurse Midwife

## 2017-03-01 ENCOUNTER — Other Ambulatory Visit (HOSPITAL_COMMUNITY): Payer: Self-pay | Admitting: Maternal and Fetal Medicine

## 2017-03-01 DIAGNOSIS — O358XX0 Maternal care for other (suspected) fetal abnormality and damage, not applicable or unspecified: Secondary | ICD-10-CM

## 2017-03-01 DIAGNOSIS — O35EXX0 Maternal care for other (suspected) fetal abnormality and damage, fetal genitourinary anomalies, not applicable or unspecified: Secondary | ICD-10-CM

## 2017-03-01 DIAGNOSIS — O0933 Supervision of pregnancy with insufficient antenatal care, third trimester: Secondary | ICD-10-CM | POA: Diagnosis not present

## 2017-03-01 DIAGNOSIS — O99213 Obesity complicating pregnancy, third trimester: Secondary | ICD-10-CM | POA: Diagnosis not present

## 2017-03-01 DIAGNOSIS — O9989 Other specified diseases and conditions complicating pregnancy, childbirth and the puerperium: Secondary | ICD-10-CM | POA: Diagnosis not present

## 2017-03-01 DIAGNOSIS — Z3A31 31 weeks gestation of pregnancy: Secondary | ICD-10-CM

## 2017-03-01 DIAGNOSIS — O0930 Supervision of pregnancy with insufficient antenatal care, unspecified trimester: Secondary | ICD-10-CM

## 2017-03-01 DIAGNOSIS — N1330 Unspecified hydronephrosis: Secondary | ICD-10-CM | POA: Insufficient documentation

## 2017-03-01 NOTE — Addendum Note (Signed)
Encounter addended by: Marcellina MillinMoser, Berit Raczkowski L, RTR on: 03/01/2017 11:10 AM  Actions taken: Imaging Exam ended

## 2017-03-03 ENCOUNTER — Encounter: Payer: Self-pay | Admitting: Obstetrics

## 2017-03-03 ENCOUNTER — Ambulatory Visit (INDEPENDENT_AMBULATORY_CARE_PROVIDER_SITE_OTHER): Payer: Medicaid Other | Admitting: Obstetrics

## 2017-03-03 VITALS — BP 127/77 | HR 102 | Wt 254.0 lb

## 2017-03-03 DIAGNOSIS — M545 Low back pain, unspecified: Secondary | ICD-10-CM

## 2017-03-03 DIAGNOSIS — O99213 Obesity complicating pregnancy, third trimester: Secondary | ICD-10-CM

## 2017-03-03 DIAGNOSIS — E669 Obesity, unspecified: Secondary | ICD-10-CM

## 2017-03-03 DIAGNOSIS — O24410 Gestational diabetes mellitus in pregnancy, diet controlled: Secondary | ICD-10-CM

## 2017-03-03 MED ORDER — COMFORT FIT MATERNITY SUPP SM MISC: 0 refills | Status: DC

## 2017-03-03 NOTE — Progress Notes (Signed)
Subjective:  Sarah Mayo is a 26 y.o. G2P1001 at 6173w5d being seen today for ongoing prenatal care.  She is currently monitored for the following issues for this high-risk pregnancy and has BMI 32.0-32.9,adult; Sickle cell trait (HCC); Hemoglobin C trait (HCC); History of marijuana use; Supervision of other normal pregnancy, antepartum; Encounter for repeat ultrasound of fetal pyelectasis in singleton pregnancy, antepartum; Obesity affecting pregnancy, antepartum; and Fetal abnormality during pregnancy, antepartum on their problem list.  Patient reports backache.  Contractions: Irritability. Vag. Bleeding: None.  Movement: Present. Denies leaking of fluid.   The following portions of the patient's history were reviewed and updated as appropriate: allergies, current medications, past family history, past medical history, past social history, past surgical history and problem list. Problem list updated.  Objective:   Vitals:   03/03/17 0935  BP: 127/77  Pulse: (!) 102  Weight: 254 lb (115.2 kg)    Fetal Status: Fetal Heart Rate (bpm): 150   Movement: Present     General:  Alert, oriented and cooperative. Patient is in no acute distress.  Skin: Skin is warm and dry. No rash noted.   Cardiovascular: Normal heart rate noted  Respiratory: Normal respiratory effort, no problems with respiration noted  Abdomen: Soft, gravid, appropriate for gestational age. Pain/Pressure: Present     Pelvic:  Cervical exam deferred        Extremities: Normal range of motion.  Edema: Trace  Mental Status: Normal mood and affect. Normal behavior. Normal judgment and thought content.   Urinalysis:      Assessment and Plan:  Pregnancy: G2P1001 at 7473w5d  1. Diet controlled White classification A1 gestational diabetes mellitus (GDM) - glucose levels stable  2. Obesity affecting pregnancy, antepartum, third trimester  3. Acute midline low back pain without sciatica Rx: - Elastic Bandages & Supports  (COMFORT FIT MATERNITY SUPP SM) MISC; Wear as directed.  Dispense: 1 each; Refill: 0   Preterm labor symptoms and general obstetric precautions including but not limited to vaginal bleeding, contractions, leaking of fluid and fetal movement were reviewed in detail with the patient. Please refer to After Visit Summary for other counseling recommendations.  Return in about 2 weeks (around 03/17/2017) for ROB.   Brock BadHarper, Charles A, MD

## 2017-03-03 NOTE — Progress Notes (Signed)
Pt c/o intermittent pain in abdomen, hips, and lower back. She states she does not have a maternity belt.

## 2017-03-11 DIAGNOSIS — Z3481 Encounter for supervision of other normal pregnancy, first trimester: Secondary | ICD-10-CM

## 2017-03-17 ENCOUNTER — Encounter: Payer: Self-pay | Admitting: Obstetrics

## 2017-03-17 ENCOUNTER — Ambulatory Visit (INDEPENDENT_AMBULATORY_CARE_PROVIDER_SITE_OTHER): Payer: Medicaid Other | Admitting: Obstetrics

## 2017-03-17 VITALS — BP 108/72 | HR 106 | Wt 205.2 lb

## 2017-03-17 DIAGNOSIS — O0993 Supervision of high risk pregnancy, unspecified, third trimester: Secondary | ICD-10-CM

## 2017-03-17 DIAGNOSIS — O99213 Obesity complicating pregnancy, third trimester: Secondary | ICD-10-CM

## 2017-03-17 DIAGNOSIS — M545 Low back pain, unspecified: Secondary | ICD-10-CM

## 2017-03-17 DIAGNOSIS — O24410 Gestational diabetes mellitus in pregnancy, diet controlled: Secondary | ICD-10-CM

## 2017-03-17 DIAGNOSIS — O0990 Supervision of high risk pregnancy, unspecified, unspecified trimester: Secondary | ICD-10-CM

## 2017-03-17 NOTE — Progress Notes (Signed)
Pt has sugars with her.

## 2017-03-18 ENCOUNTER — Encounter: Payer: Self-pay | Admitting: Obstetrics

## 2017-03-18 NOTE — Progress Notes (Signed)
Subjective:  Sarah Mayo is a 26 y.o. G2P1001 at 6141w6d being seen today for ongoing prenatal care.  She is currently monitored for the following issues for this high-risk pregnancy and has BMI 32.0-32.9,adult; Sickle cell trait (HCC); Hemoglobin C trait (HCC); History of marijuana use; Supervision of other normal pregnancy, antepartum; Encounter for repeat ultrasound of fetal pyelectasis in singleton pregnancy, antepartum; Obesity affecting pregnancy, antepartum, third trimester; and Fetal abnormality during pregnancy, antepartum on their problem list.  Patient reports backache.  Contractions: Irregular. Vag. Bleeding: None.  Movement: Present. Denies leaking of fluid.   The following portions of the patient's history were reviewed and updated as appropriate: allergies, current medications, past family history, past medical history, past social history, past surgical history and problem list. Problem list updated.  Objective:   Vitals:   03/17/17 0906  BP: 108/72  Pulse: (!) 106  Weight: 205 lb 3.2 oz (93.1 kg)    Fetal Status: Fetal Heart Rate (bpm): 150   Movement: Present     General:  Alert, oriented and cooperative. Patient is in no acute distress.  Skin: Skin is warm and dry. No rash noted.   Cardiovascular: Normal heart rate noted  Respiratory: Normal respiratory effort, no problems with respiration noted  Abdomen: Soft, gravid, appropriate for gestational age. Pain/Pressure: Present     Pelvic:  Cervical exam deferred        Extremities: Normal range of motion.  Edema: Trace  Mental Status: Normal mood and affect. Normal behavior. Normal judgment and thought content.   Urinalysis:      Assessment and Plan:  Pregnancy: G2P1001 at 5241w6d  1. Supervision of high risk pregnancy, antepartum  2. Diet controlled White classification A1 gestational diabetes mellitus (GDM)   3. Obesity affecting pregnancy, antepartum, third trimester   4. Acute midline low back pain  without sciatica - Maternity Belt Rx  Preterm labor symptoms and general obstetric precautions including but not limited to vaginal bleeding, contractions, leaking of fluid and fetal movement were reviewed in detail with the patient. Please refer to After Visit Summary for other counseling recommendations.  Return in about 2 weeks (around 03/31/2017) for ROB.   Brock BadHarper, Charles A, MD

## 2017-03-22 NOTE — L&D Delivery Note (Signed)
Patient is 27 y.o. G2P1001 6777w3d admitted for SOL. S/p PitocinPrenatal course also complicated by A1GDM.  Delivery Note At 12:34 PM a viable female was delivered via Vaginal, Spontaneous (Presentation: LOA ).  APGAR: pending ; weight  pending.   Placenta status: visually intact, manually extracted .  Cord:  3 vessel with the following complications: avulsion   Anesthesia:  Epidural  Episiotomy: None Lacerations:  None  Est. Blood Loss (mL):  961cc  Head delivered LOA. No nuchal cord present. Suprapubic pressure needed. Anterior shoulder hooked and then delivered prior to posterior shoulder. Body delivered in usual fashion. Infant with spontaneous cry, placed on mother's abdomen, dried and bulb suctioned. Cord clamped x 2 after 1-minute delay, and cut by family member. Cord blood drawn. Avulsion of placental cord with gentle traction. Placenta was manually extracted by Philipp DeputyKim Shaw, CNM. Placenta placed on table and inspected and found to be intact, sent to pathology for confirmation. Fundus firm with massage and Pitocin. Perineum inspected and found to have no laceration, small labial and sulcal abrasions noted but hemostatic.   Mom to postpartum.  Baby to Couplet care / Skin to Skin.  Oralia ManisSherin Karysa Heft, DO PGY-1 05/03/2017, 12:58 PM

## 2017-03-29 ENCOUNTER — Observation Stay
Admission: EM | Admit: 2017-03-29 | Discharge: 2017-03-29 | Disposition: A | Discharge status: Home / Self Care | Payer: Medicaid Other | Attending: Obstetrics & Gynecology | Admitting: Obstetrics & Gynecology

## 2017-03-29 ENCOUNTER — Encounter: Payer: Self-pay | Admitting: Obstetrics and Gynecology

## 2017-03-29 ENCOUNTER — Other Ambulatory Visit: Payer: Self-pay

## 2017-03-29 DIAGNOSIS — Z3A35 35 weeks gestation of pregnancy: Secondary | ICD-10-CM | POA: Insufficient documentation

## 2017-03-29 DIAGNOSIS — D573 Sickle-cell trait: Secondary | ICD-10-CM | POA: Diagnosis not present

## 2017-03-29 DIAGNOSIS — O26893 Other specified pregnancy related conditions, third trimester: Secondary | ICD-10-CM | POA: Diagnosis not present

## 2017-03-29 DIAGNOSIS — R109 Unspecified abdominal pain: Secondary | ICD-10-CM

## 2017-03-29 LAB — URINALYSIS, ROUTINE W REFLEX MICROSCOPIC
Bilirubin Urine: NEGATIVE
GLUCOSE, UA: NEGATIVE mg/dL
Hgb urine dipstick: NEGATIVE
KETONES UR: NEGATIVE mg/dL
Nitrite: NEGATIVE
PH: 6 (ref 5.0–8.0)
Protein, ur: 30 mg/dL — AB
Specific Gravity, Urine: 1.031 — ABNORMAL HIGH (ref 1.005–1.030)

## 2017-03-29 LAB — GLUCOSE, CAPILLARY: Glucose-Capillary: 111 mg/dL — ABNORMAL HIGH (ref 65–99)

## 2017-03-29 MED ORDER — ACETAMINOPHEN 325 MG PO TABS
650.0000 mg | ORAL_TABLET | ORAL | Status: DC | PRN
  Administered 2017-03-29: 650 mg via ORAL
  Filled 2017-03-29: qty 2

## 2017-03-29 NOTE — Discharge Instructions (Signed)
Please call or return to the [facility] with any of the following: Leaking of Fluid Contractions every 3-5 mins for an hour Vaginal Bleeding Decreased Fetal Movement

## 2017-03-29 NOTE — OB Triage Note (Signed)
Pt presents to L&D with c/o 7/10 stabbing abdominal pain at 1900. Abdominal pain is now at 2/10. Pt denies leaking of fluid, vaginal bleeding, or decreased fetal movement. Pt does note have intermittent contractions. Pt has been experiencing abdominal pain "on and off over the last few weeks." EFM and toco applied. Will monitor.

## 2017-03-30 ENCOUNTER — Encounter: Payer: Self-pay | Admitting: *Deleted

## 2017-03-31 ENCOUNTER — Other Ambulatory Visit: Payer: Self-pay

## 2017-03-31 ENCOUNTER — Encounter: Payer: Self-pay | Admitting: Obstetrics

## 2017-03-31 ENCOUNTER — Inpatient Hospital Stay (HOSPITAL_COMMUNITY)
Admission: AD | Admit: 2017-03-31 | Discharge: 2017-03-31 | Disposition: A | Discharge status: Home / Self Care | Payer: Medicaid Other | Source: Ambulatory Visit | Attending: Obstetrics and Gynecology | Admitting: Obstetrics and Gynecology

## 2017-03-31 ENCOUNTER — Ambulatory Visit (INDEPENDENT_AMBULATORY_CARE_PROVIDER_SITE_OTHER): Payer: Medicaid Other | Admitting: Obstetrics

## 2017-03-31 ENCOUNTER — Encounter (HOSPITAL_COMMUNITY): Payer: Self-pay

## 2017-03-31 ENCOUNTER — Other Ambulatory Visit (HOSPITAL_COMMUNITY)
Admission: RE | Admit: 2017-03-31 | Discharge: 2017-03-31 | Disposition: A | Discharge status: Home / Self Care | Payer: Medicaid Other | Source: Ambulatory Visit | Attending: Obstetrics | Admitting: Obstetrics

## 2017-03-31 ENCOUNTER — Inpatient Hospital Stay (HOSPITAL_COMMUNITY): Payer: Medicaid Other

## 2017-03-31 VITALS — BP 126/75 | HR 99 | Wt 253.9 lb

## 2017-03-31 DIAGNOSIS — O0933 Supervision of pregnancy with insufficient antenatal care, third trimester: Secondary | ICD-10-CM | POA: Diagnosis not present

## 2017-03-31 DIAGNOSIS — O09893 Supervision of other high risk pregnancies, third trimester: Secondary | ICD-10-CM | POA: Insufficient documentation

## 2017-03-31 DIAGNOSIS — F1210 Cannabis abuse, uncomplicated: Secondary | ICD-10-CM | POA: Insufficient documentation

## 2017-03-31 DIAGNOSIS — Z3A35 35 weeks gestation of pregnancy: Secondary | ICD-10-CM | POA: Diagnosis not present

## 2017-03-31 DIAGNOSIS — O99013 Anemia complicating pregnancy, third trimester: Secondary | ICD-10-CM | POA: Insufficient documentation

## 2017-03-31 DIAGNOSIS — O4693 Antepartum hemorrhage, unspecified, third trimester: Secondary | ICD-10-CM | POA: Insufficient documentation

## 2017-03-31 DIAGNOSIS — O24410 Gestational diabetes mellitus in pregnancy, diet controlled: Secondary | ICD-10-CM | POA: Insufficient documentation

## 2017-03-31 DIAGNOSIS — E669 Obesity, unspecified: Secondary | ICD-10-CM | POA: Diagnosis not present

## 2017-03-31 DIAGNOSIS — B9689 Other specified bacterial agents as the cause of diseases classified elsewhere: Secondary | ICD-10-CM

## 2017-03-31 DIAGNOSIS — O99213 Obesity complicating pregnancy, third trimester: Secondary | ICD-10-CM | POA: Insufficient documentation

## 2017-03-31 DIAGNOSIS — D582 Other hemoglobinopathies: Secondary | ICD-10-CM | POA: Diagnosis not present

## 2017-03-31 DIAGNOSIS — O359XX0 Maternal care for (suspected) fetal abnormality and damage, unspecified, not applicable or unspecified: Secondary | ICD-10-CM | POA: Insufficient documentation

## 2017-03-31 DIAGNOSIS — R109 Unspecified abdominal pain: Secondary | ICD-10-CM | POA: Diagnosis not present

## 2017-03-31 DIAGNOSIS — N1330 Unspecified hydronephrosis: Secondary | ICD-10-CM | POA: Diagnosis not present

## 2017-03-31 DIAGNOSIS — D573 Sickle-cell trait: Secondary | ICD-10-CM | POA: Insufficient documentation

## 2017-03-31 DIAGNOSIS — O0993 Supervision of high risk pregnancy, unspecified, third trimester: Secondary | ICD-10-CM

## 2017-03-31 DIAGNOSIS — O35EXX0 Maternal care for other (suspected) fetal abnormality and damage, fetal genitourinary anomalies, not applicable or unspecified: Secondary | ICD-10-CM

## 2017-03-31 DIAGNOSIS — O26893 Other specified pregnancy related conditions, third trimester: Secondary | ICD-10-CM | POA: Insufficient documentation

## 2017-03-31 DIAGNOSIS — O358XX0 Maternal care for other (suspected) fetal abnormality and damage, not applicable or unspecified: Secondary | ICD-10-CM | POA: Diagnosis not present

## 2017-03-31 DIAGNOSIS — N760 Acute vaginitis: Secondary | ICD-10-CM

## 2017-03-31 DIAGNOSIS — O288 Other abnormal findings on antenatal screening of mother: Secondary | ICD-10-CM

## 2017-03-31 DIAGNOSIS — O368130 Decreased fetal movements, third trimester, not applicable or unspecified: Secondary | ICD-10-CM | POA: Diagnosis present

## 2017-03-31 DIAGNOSIS — O99323 Drug use complicating pregnancy, third trimester: Secondary | ICD-10-CM | POA: Insufficient documentation

## 2017-03-31 DIAGNOSIS — O0990 Supervision of high risk pregnancy, unspecified, unspecified trimester: Secondary | ICD-10-CM

## 2017-03-31 LAB — URINALYSIS, ROUTINE W REFLEX MICROSCOPIC
BILIRUBIN URINE: NEGATIVE
Glucose, UA: NEGATIVE mg/dL
KETONES UR: NEGATIVE mg/dL
Nitrite: NEGATIVE
PROTEIN: 30 mg/dL — AB
SPECIFIC GRAVITY, URINE: 1.021 (ref 1.005–1.030)
pH: 6 (ref 5.0–8.0)

## 2017-03-31 LAB — WET PREP, GENITAL
SPERM: NONE SEEN
TRICH WET PREP: NONE SEEN
YEAST WET PREP: NONE SEEN

## 2017-03-31 MED ORDER — METRONIDAZOLE 500 MG PO TABS: 500.0000 mg | ORAL_TABLET | Freq: Two times a day (BID) | ORAL | 0 refills | Status: AC

## 2017-03-31 NOTE — MAU Note (Signed)
Pt noted some blood on tissue when she wiped at 1730, also reports decreased fetal movement for the last 1-2 hours, pain in left lower abd for same amount of time.

## 2017-03-31 NOTE — Progress Notes (Signed)
Subjective:  Sarah Mayo is a 27 y.o. G2P1001 at 3174w5d being seen today for ongoing prenatal care.  She is currently monitored for the following issues for this high-risk pregnancy and has BMI 32.0-32.9,adult; Sickle cell trait (HCC); Hemoglobin C trait (HCC); History of marijuana use; Supervision of other normal pregnancy, antepartum; Encounter for repeat ultrasound of fetal pyelectasis in singleton pregnancy, antepartum; Obesity affecting pregnancy, antepartum, third trimester; Fetal abnormality during pregnancy, antepartum; and Abdominal pain in pregnancy, third trimester on their problem list.  Patient reports no complaints.  Contractions: Irritability. Vag. Bleeding: None.  Movement: Present. Denies leaking of fluid.   The following portions of the patient's history were reviewed and updated as appropriate: allergies, current medications, past family history, past medical history, past social history, past surgical history and problem list. Problem list updated.  Objective:   Vitals:   03/31/17 1034  BP: 126/75  Pulse: 99  Weight: 253 lb 14.4 oz (115.2 kg)    Fetal Status: Fetal Heart Rate (bpm): 150   Movement: Present     General:  Alert, oriented and cooperative. Patient is in no acute distress.  Skin: Skin is warm and dry. No rash noted.   Cardiovascular: Normal heart rate noted  Respiratory: Normal respiratory effort, no problems with respiration noted  Abdomen: Soft, gravid, appropriate for gestational age. Pain/Pressure: Present     Pelvic:  Cervical exam deferred        Extremities: Normal range of motion.  Edema: Trace  Mental Status: Normal mood and affect. Normal behavior. Normal judgment and thought content.   Urinalysis:      Assessment and Plan:  Pregnancy: G2P1001 at 5974w5d  1. Supervision of high risk pregnancy, antepartum Rx: - Strep Gp B NAA - Cervicovaginal ancillary only  2. Diet controlled White classification A1 gestational diabetes mellitus  (GDM) - good glucose control  3. Obesity affecting pregnancy, antepartum, third trimester  Term labor symptoms and general obstetric precautions including but not limited to vaginal bleeding, contractions, leaking of fluid and fetal movement were reviewed in detail with the patient. Please refer to After Visit Summary for other counseling recommendations.  Return in about 1 week (around 04/07/2017) for ROB.   Brock BadHarper, Tildon Silveria A, MD

## 2017-03-31 NOTE — MAU Provider Note (Signed)
Chief Complaint:  Abdominal Pain; Vaginal Bleeding; and Decreased Fetal Movement   First Provider Initiated Contact with Patient 03/31/17 1904      HPI: Sarah Mayo is a 27 y.o. G2P1001 at 60w5dwho presents to maternity admissions reporting decreased fetal movement, vaginal spotting and cramping.  Spotting began at 1700 today, limited to blood on tissue without enough to drip into underwear.  She began to feel decreased fetal movement between 1730 and 1800.  She continues to feel decreased fetal movement in the MAU.  She began to feel cramping around the time of bleeding (~1700). Cramping is in lower quadrants, associated with movement, rates it a 6/10.    PMHx notable for A1GDM.  Upon questioning, patient typically has PPs in the 120-150 range and fasting sugars around 110.  She denies vaginal itching/burning, urinary symptoms, h/a, dizziness, n/v, diarrhea, constipation or fever/chills.  She denies headache, visual changes or RUQ abdominal pain.  Her [place] [facility], results shown below.   Single living intrauterine pregnancy at 31+3 weeks with fetal  pyelecastis  Normal fetal movement and cardiac activity  Appropriate fetal growth in the 75th percentile  Normal amniotic fluid volume.  Bilateral urinary tract dilation isstill present, right 8.5 mm, left  6-7 mm (UTD 2)  Otherwise normal fetal anatomy.  Past Medical History: Past Medical History:  Diagnosis Date  . Medical history non-contributory   . No pertinent past medical history   . Sickle cell trait (HCC)     Past obstetric history: OB History  Gravida Para Term Preterm AB Living  _0 SAB TAB Ectopic Multiple Live Births        0 1    # Outcome Date GA Lbr Len/2nd Weight Sex Delivery Anes PTL Lv  2 Current           1 Term 07/23/14 331w5d4:00 / 00:02 3.46 kg (7 lb 10.1 oz) M Vag-Spont Local  LIV     Birth Comments: large dark birth mark on R abdomen      Past Surgical History: Past Surgical  History:  Procedure Laterality Date  . NO PAST SURGERIES      Family History: Family History  Problem Relation Age of Onset  . Asthma Brother   . Sickle cell trait Mother     Social History: Social History   Tobacco Use  . Smoking status: Never Smoker  . Smokeless tobacco: Never Used  Substance Use Topics  . Alcohol use: No    Comment: Social, Last drink August 2018  . Drug use: No    Comment: quit with pregnancy    Allergies: No Known Allergies  Meds:  Medications Prior to Admission  Medication Sig Dispense Refill Last Dose  . Blood Glucose Monitoring Suppl (ACCU-CHEK GUIDE) w/Device KIT 1 Device by Does not apply route 4 (four) times daily. 1 kit 0 03/31/2017 at Unknown time  . Elastic Bandages & Supports (COMFORT FIT MATERNITY SUPP SM) MISC Wear as directed. 1 each 0 03/30/2017 at Unknown time  . glucose blood test strip Use as instructed 100 each 12 03/31/2017 at Unknown time  . Prenatal Vit-Fe Fumarate-FA (PRENATAL MULTIVITAMIN) TABS tablet Take 1 tablet by mouth daily at 12 noon.   03/31/2017 at Unknown time  . ACCU-CHEK FASTCLIX LANCETS MISC 1 Container by Percutaneous route 4 (four) times daily. Test BS AM fasting, and 2 hours after every meal. 102 each 12 Taking    I  have reviewed patient's Past Medical Hx, Surgical Hx, Family Hx, Social Hx, medications and allergies.   ROS: Other systems negative  Physical Exam   Patient Vitals for the past 24 hrs:  BP Temp Temp src Pulse Resp SpO2 Height Weight  03/31/17 1822 128/76 97.6 F (36.4 C) Oral 95 16 100 % _0  (1.753 m) 114.8 kg (253 lb)   Constitutional: Well-developed, well-nourished female in no acute distress.  Cardiovascular: normal rate and rhythm Respiratory: normal effort, clear to auscultation bilaterally GI: Abd soft, non-tender, gravid appropriate for gestational age.   No rebound or guarding. No tenderness to palpation in lower quadrants or suprapubic. MS: Extremities nontender, no edema, normal  ROM Neurologic: Alert and oriented x 4.  GU: Neg CVAT.  PELVIC EXAM: Cervix pink, visually closed, without lesion, vaginal walls and external genitalia normal.  Scant blood and thick yellow-ish discharge observed in vaginal vault.   BIMANUAL EXAM: Cervix firm, posterior, neg CMT, uterus nontender.  FHT:  Baseline 145, moderate variability, no accelerations, no decelerations Contractions: Not observed  Labs: Results for orders placed or performed during the hospital encounter of 03/31/17 (from the past 24 hour(s))  Urinalysis, Routine w reflex microscopic     Status: Abnormal   Collection Time: 03/31/17  6:26 PM  Result Value Ref Range   Color, Urine YELLOW YELLOW   APPearance HAZY (A) CLEAR   Specific Gravity, Urine 1.021 1.005 - 1.030   pH 6.0 5.0 - 8.0   Glucose, UA NEGATIVE NEGATIVE mg/dL   Hgb urine dipstick MODERATE (A) NEGATIVE   Bilirubin Urine NEGATIVE NEGATIVE   Ketones, ur NEGATIVE NEGATIVE mg/dL   Protein, ur 30 (A) NEGATIVE mg/dL   Nitrite NEGATIVE NEGATIVE   Leukocytes, UA LARGE (A) NEGATIVE   RBC / HPF 6-30 0 - 5 RBC/hpf   WBC, UA 6-30 0 - 5 WBC/hpf   Bacteria, UA RARE (A) NONE SEEN   Squamous Epithelial / LPF 6-30 (A) NONE SEEN   Mucus PRESENT    A/Positive/-- (09/26 1340)  Imaging:  No results found.  MAU Course/MDM: I have ordered labs and reviewed results.  NST reviewed.  Assessment: 1. Obesity affecting pregnancy, antepartum, third trimester   2. Encounter for repeat ultrasound of fetal pyelectasis in singleton pregnancy, antepartum   3. Non-stress test nonreactive     Plan:  Sarah Mayo, MS3 03/31/2017 7:38 PM   .I confirm that I have verified the information documented in the medical student's note and that I have also personally reperformed the physical exam and all medical decision making activities. The patient was seen and examined by me also  I assumed care from [name], who did most of the visit. Agree with note NST  reactive and reassuring UCs as listed Cervical exams as listed in note  BPP 8/8.  FHR reassuring now.  Ready for discharge Will have her followup in clinic as scheduled Sarah Mayo, CNM

## 2017-03-31 NOTE — Discharge Instructions (Signed)
Third Trimester of Pregnancy °The third trimester is from week 29 through week 42, months 7 through 9. This trimester is when your unborn baby (fetus) is growing very fast. At the end of the ninth month, the unborn baby is about 20 inches in length. It weighs about 6-10 pounds. °Follow these instructions at home: °· Avoid all smoking, herbs, and alcohol. Avoid drugs not approved by your doctor. °· Do not use any tobacco products, including cigarettes, chewing tobacco, and electronic cigarettes. If you need help quitting, ask your doctor. You may get counseling or other support to help you quit. °· Only take medicine as told by your doctor. Some medicines are safe and some are not during pregnancy. °· Exercise only as told by your doctor. Stop exercising if you start having cramps. °· Eat regular, healthy meals. °· Wear a good support bra if your breasts are tender. °· Do not use hot tubs, steam rooms, or saunas. °· Wear your seat belt when driving. °· Avoid raw meat, uncooked cheese, and liter boxes and soil used by cats. °· Take your prenatal vitamins. °· Take 1500-2000 milligrams of calcium daily starting at the 20th week of pregnancy until you deliver your baby. °· Try taking medicine that helps you poop (stool softener) as needed, and if your doctor approves. Eat more fiber by eating fresh fruit, vegetables, and whole grains. Drink enough fluids to keep your pee (urine) clear or pale yellow. °· Take warm water baths (sitz baths) to soothe pain or discomfort caused by hemorrhoids. Use hemorrhoid cream if your doctor approves. °· If you have puffy, bulging veins (varicose veins), wear support hose. Raise (elevate) your feet for 15 minutes, 3-4 times a day. Limit salt in your diet. °· Avoid heavy lifting, wear low heels, and sit up straight. °· Rest with your legs raised if you have leg cramps or low back pain. °· Visit your dentist if you have not gone during your pregnancy. Use a soft toothbrush to brush your  teeth. Be gentle when you floss. °· You can have sex (intercourse) unless your doctor tells you not to. °· Do not travel far distances unless you must. Only do so with your doctor's approval. °· Take prenatal classes. °· Practice driving to the hospital. °· Pack your hospital bag. °· Prepare the baby's room. °· Go to your doctor visits. °Get help if: °· You are not sure if you are in labor or if your water has broken. °· You are dizzy. °· You have mild cramps or pressure in your lower belly (abdominal). °· You have a nagging pain in your belly area. °· You continue to feel sick to your stomach (nauseous), throw up (vomit), or have watery poop (diarrhea). °· You have bad smelling fluid coming from your vagina. °· You have pain with peeing (urination). °Get help right away if: °· You have a fever. °· You are leaking fluid from your vagina. °· You are spotting or bleeding from your vagina. °· You have severe belly cramping or pain. °· You lose or gain weight rapidly. °· You have trouble catching your breath and have chest pain. °· You notice sudden or extreme puffiness (swelling) of your face, hands, ankles, feet, or legs. °· You have not felt the baby move in over an hour. °· You have severe headaches that do not go away with medicine. °· You have vision changes. °This information is not intended to replace advice given to you by your health care provider. Make   sure you discuss any questions you have with your health care provider. Document Released: 06/02/2009 Document Revised: 08/14/2015 Document Reviewed: 05/09/2012 Elsevier Interactive Patient Education  2017 Elsevier Inc. Bacterial Vaginosis Bacterial vaginosis is a vaginal infection that occurs when the normal balance of bacteria in the vagina is disrupted. It results from an overgrowth of certain bacteria. This is the most common vaginal infection among women ages 6915-44. Because bacterial vaginosis increases your risk for STIs (sexually transmitted  infections), getting treated can help reduce your risk for chlamydia, gonorrhea, herpes, and HIV (human immunodeficiency virus). Treatment is also important for preventing complications in pregnant women, because this condition can cause an early (premature) delivery. What are the causes? This condition is caused by an increase in harmful bacteria that are normally present in small amounts in the vagina. However, the reason that the condition develops is not fully understood. What increases the risk? The following factors may make you more likely to develop this condition:  Having a new sexual partner or multiple sexual partners.  Having unprotected sex.  Douching.  Having an intrauterine device (IUD).  Smoking.  Drug and alcohol abuse.  Taking certain antibiotic medicines.  Being pregnant.  You cannot get bacterial vaginosis from toilet seats, bedding, swimming pools, or contact with objects around you. What are the signs or symptoms? Symptoms of this condition include:  Grey or white vaginal discharge. The discharge can also be watery or foamy.  A fish-like odor with discharge, especially after sexual intercourse or during menstruation.  Itching in and around the vagina.  Burning or pain with urination.  Some women with bacterial vaginosis have no signs or symptoms. How is this diagnosed? This condition is diagnosed based on:  Your medical history.  A physical exam of the vagina.  Testing a sample of vaginal fluid under a microscope to look for a large amount of bad bacteria or abnormal cells. Your health care provider may use a cotton swab or a small wooden spatula to collect the sample.  How is this treated? This condition is treated with antibiotics. These may be given as a pill, a vaginal cream, or a medicine that is put into the vagina (suppository). If the condition comes back after treatment, a second round of antibiotics may be needed. Follow these instructions  at home: Medicines  Take over-the-counter and prescription medicines only as told by your health care provider.  Take or use your antibiotic as told by your health care provider. Do not stop taking or using the antibiotic even if you start to feel better. General instructions  If you have a female sexual partner, tell her that you have a vaginal infection. She should see her health care provider and be treated if she has symptoms. If you have a female sexual partner, he does not need treatment.  During treatment: ? Avoid sexual activity until you finish treatment. ? Do not douche. ? Avoid alcohol as directed by your health care provider. ? Avoid breastfeeding as directed by your health care provider.  Drink enough water and fluids to keep your urine clear or pale yellow.  Keep the area around your vagina and rectum clean. ? Wash the area daily with warm water. ? Wipe yourself from front to back after using the toilet.  Keep all follow-up visits as told by your health care provider. This is important. How is this prevented?  Do not douche.  Wash the outside of your vagina with warm water only.  Use protection when  having sex. This includes latex condoms and dental dams.  Limit how many sexual partners you have. To help prevent bacterial vaginosis, it is best to have sex with just one partner (monogamous).  Make sure you and your sexual partner are tested for STIs.  Wear cotton or cotton-lined underwear.  Avoid wearing tight pants and pantyhose, especially during summer.  Limit the amount of alcohol that you drink.  Do not use any products that contain nicotine or tobacco, such as cigarettes and e-cigarettes. If you need help quitting, ask your health care provider.  Do not use illegal drugs. Where to find more information:  Centers for Disease Control and Prevention: SolutionApps.co.za  American Sexual Health Association (ASHA): www.ashastd.org  U.S. Department of Health  and Health and safety inspector, Office on Women's Health: ConventionalMedicines.si or http://www.anderson-williamson.info/ Contact a health care provider if:  Your symptoms do not improve, even after treatment.  You have more discharge or pain when urinating.  You have a fever.  You have pain in your abdomen.  You have pain during sex.  You have vaginal bleeding between periods. Summary  Bacterial vaginosis is a vaginal infection that occurs when the normal balance of bacteria in the vagina is disrupted.  Because bacterial vaginosis increases your risk for STIs (sexually transmitted infections), getting treated can help reduce your risk for chlamydia, gonorrhea, herpes, and HIV (human immunodeficiency virus). Treatment is also important for preventing complications in pregnant women, because the condition can cause an early (premature) delivery.  This condition is treated with antibiotic medicines. These may be given as a pill, a vaginal cream, or a medicine that is put into the vagina (suppository). This information is not intended to replace advice given to you by your health care provider. Make sure you discuss any questions you have with your health care provider. Document Released: 03/08/2005 Document Revised: 07/12/2016 Document Reviewed: 11/22/2015 Elsevier Interactive Patient Education  [employer].

## 2017-03-31 NOTE — Progress Notes (Signed)
Complains of back and abdominal pain when she walks, [facility] and told that she is having [employer] CorporationBraxton Hicks.

## 2017-04-01 LAB — CERVICOVAGINAL ANCILLARY ONLY
BACTERIAL VAGINITIS: POSITIVE — AB
CHLAMYDIA, DNA PROBE: NEGATIVE
Candida vaginitis: NEGATIVE
NEISSERIA GONORRHEA: NEGATIVE
TRICH (WINDOWPATH): NEGATIVE

## 2017-04-01 NOTE — Discharge Summary (Signed)
Sarah Mayo is a 27 y.o. female. She is at 44w3dgestation. Patient's last menstrual period was 07/20/2016 (within days). Estimated Date of Delivery: 04/30/17  [facility]  Location: mid abdomen Onset/timing: sudden Duration: 2 hours, now resolved Quality: stabbing Severity: severe, 7/10 now 2/10 Aggravating or alleviating conditions: nothing made better or worse, just time. Associated signs/symptoms: no CTX, no VB.no LOF,  Active fetal movement. Context: high-risk patient unassigned, with sickle cell trait, had sudden onset severe abdominal pain, that has resovled upon arrival to the hospital.  Maternal Medical History:   Past Medical History:  Diagnosis Date  . Medical history non-contributory   . No pertinent past medical history   . Sickle cell trait ([facility]     Past Surgical History:  Procedure Laterality Date  . NO PAST SURGERIES      No Known Allergies  Prior to Admission medications   Medication Sig Start Date End Date Taking? Authorizing Provider  Blood Glucose Monitoring Suppl (ACCU-CHEK GUIDE) w/Device KIT 1 Device by Does not apply route 4 (four) times daily. 02/14/17  Yes EChancy Milroy MD  Elastic Bandages & Supports (COMFORT FIT MATERNITY SUPP SM) MISC Wear as directed. 03/03/17  Yes HShelly Bombard MD  glucose blood test strip Use as instructed 02/14/17  Yes EChancy Milroy MD  Prenatal Vit-Fe Fumarate-FA (PRENATAL MULTIVITAMIN) TABS tablet Take 1 tablet by mouth daily at 12 noon.   Yes [provider]  ACCU-CHEK FASTCLIX LANCETS MISC 1 Container by Percutaneous route 4 (four) times daily. Test BS AM fasting, and 2 hours after every meal. 02/14/17 03/16/17  EChancy Milroy MD  metroNIDAZOLE (FLAGYL) 500 MG tablet Take 1 tablet (500 mg total) by mouth 2 (two) times daily for 7 days. 03/31/17 04/07/17  WSeabron Spates CNM     Social History: She  reports that  has  never smoked. she has never used smokeless tobacco. She reports that she does not drink alcohol or use drugs.  Family History: family history includes Asthma in her brother; Sickle cell trait in her mother.   Review of Systems: A full review of systems was performed and negative except as noted in the HPI.     O:  BP 105/72 (BP Location: Left Arm)   Pulse (!) 105   Temp 98.1 F (36.7 C) (Oral)   Resp 16   LMP 07/20/2016 (Within Days)   Constitutional: NAD, AAOx3  HE/ENT: extraocular movements grossly intact, moist mucous membranes CV: RRR PULM: nl respiratory effort, CTABL     Abd: gravid, non-tender, non-distended, soft      Ext: Non-tender, Nonedmeatous   Psych: mood appropriate, speech normal Pelvic deferred  NST:  Baseline: 140 Variability: moderate Accelerations present x >2 Decelerations absent Time 23ms  Toco: none   A/P: [age].o. 3562w3dre for resolved abdominal pain  Labor: not present.   Fetal Wellbeing: Reassuring Cat 1 tracing.  Reactive NST   D/c home stable, precautions reviewed, follow-up as scheduled.  Has f/u on 1/11.  ----- CheLarey DaysD Attending Obstetrician and Gynecologist [facility]

## 2017-04-02 ENCOUNTER — Other Ambulatory Visit: Payer: Self-pay | Admitting: Obstetrics

## 2017-04-02 LAB — STREP GP B NAA: STREP GROUP B AG: NEGATIVE

## 2017-04-02 LAB — OB RESULTS CONSOLE GBS: GBS: NEGATIVE

## 2017-04-07 ENCOUNTER — Encounter: Payer: Self-pay | Admitting: Obstetrics

## 2017-04-07 ENCOUNTER — Ambulatory Visit (INDEPENDENT_AMBULATORY_CARE_PROVIDER_SITE_OTHER): Payer: Medicaid Other | Admitting: Obstetrics

## 2017-04-07 VITALS — BP 102/70 | HR 99 | Wt 253.0 lb

## 2017-04-07 DIAGNOSIS — O24410 Gestational diabetes mellitus in pregnancy, diet controlled: Secondary | ICD-10-CM

## 2017-04-07 DIAGNOSIS — O0993 Supervision of high risk pregnancy, unspecified, third trimester: Secondary | ICD-10-CM

## 2017-04-07 DIAGNOSIS — O99213 Obesity complicating pregnancy, third trimester: Secondary | ICD-10-CM

## 2017-04-07 DIAGNOSIS — O0990 Supervision of high risk pregnancy, unspecified, unspecified trimester: Secondary | ICD-10-CM

## 2017-04-07 NOTE — Progress Notes (Signed)
Patient reports good fetal movement, denies pain. 

## 2017-04-07 NOTE — Progress Notes (Signed)
Subjective:  Sarah GavelJasmin D Mayo is a 27 y.o. G2P1001 at 268w5d being seen today for ongoing prenatal care.  She is currently monitored for the following issues for this high-risk pregnancy and has BMI 32.0-32.9,adult; Sickle cell trait (HCC); Hemoglobin C trait (HCC); History of marijuana use; Supervision of other normal pregnancy, antepartum; Encounter for repeat ultrasound of fetal pyelectasis in singleton pregnancy, antepartum; Obesity affecting pregnancy, antepartum, third trimester; Fetal abnormality during pregnancy, antepartum; and Abdominal pain in pregnancy, third trimester on their problem list.  Patient reports no complaints.  Contractions: Not present. Vag. Bleeding: None.  Movement: Present. Denies leaking of fluid.   The following portions of the patient's history were reviewed and updated as appropriate: allergies, current medications, past family history, past medical history, past social history, past surgical history and problem list. Problem list updated.  Objective:   Vitals:   04/07/17 1127  BP: 102/70  Pulse: 99  Weight: 253 lb (114.8 kg)    Fetal Status: Fetal Heart Rate (bpm): 150   Movement: Present     General:  Alert, oriented and cooperative. Patient is in no acute distress.  Skin: Skin is warm and dry. No rash noted.   Cardiovascular: Normal heart rate noted  Respiratory: Normal respiratory effort, no problems with respiration noted  Abdomen: Soft, gravid, appropriate for gestational age. Pain/Pressure: Absent     Pelvic:  Cervical exam deferred        Extremities: Normal range of motion.  Edema: Trace  Mental Status: Normal mood and affect. Normal behavior. Normal judgment and thought content.   Urinalysis:      Assessment and Plan:  Pregnancy: G2P1001 at 2168w5d  1. Supervision of high risk pregnancy, antepartum - doing well  2. Obesity affecting pregnancy, antepartum, third trimester   3. Diet controlled White classification A1 gestational diabetes  mellitus (GDM) - good glucose control  Preterm labor symptoms and general obstetric precautions including but not limited to vaginal bleeding, contractions, leaking of fluid and fetal movement were reviewed in detail with the patient. Please refer to After Visit Summary for other counseling recommendations.  Return in about 1 week (around 04/14/2017) for ROB.   Brock BadHarper, Charles A, MD

## 2017-04-12 ENCOUNTER — Other Ambulatory Visit (HOSPITAL_COMMUNITY): Payer: Self-pay | Admitting: Obstetrics and Gynecology

## 2017-04-12 ENCOUNTER — Ambulatory Visit (HOSPITAL_COMMUNITY)
Admission: RE | Admit: 2017-04-12 | Discharge: 2017-04-12 | Disposition: A | Discharge status: Home / Self Care | Payer: Medicaid Other | Source: Ambulatory Visit | Attending: Certified Nurse Midwife | Admitting: Certified Nurse Midwife

## 2017-04-12 ENCOUNTER — Encounter (HOSPITAL_COMMUNITY): Payer: Self-pay

## 2017-04-12 DIAGNOSIS — Z3A37 37 weeks gestation of pregnancy: Secondary | ICD-10-CM | POA: Diagnosis not present

## 2017-04-12 DIAGNOSIS — O358XX0 Maternal care for other (suspected) fetal abnormality and damage, not applicable or unspecified: Secondary | ICD-10-CM | POA: Diagnosis not present

## 2017-04-12 DIAGNOSIS — O24410 Gestational diabetes mellitus in pregnancy, diet controlled: Secondary | ICD-10-CM | POA: Insufficient documentation

## 2017-04-12 DIAGNOSIS — O99210 Obesity complicating pregnancy, unspecified trimester: Secondary | ICD-10-CM

## 2017-04-12 DIAGNOSIS — O283 Abnormal ultrasonic finding on antenatal screening of mother: Secondary | ICD-10-CM | POA: Diagnosis not present

## 2017-04-12 DIAGNOSIS — O0933 Supervision of pregnancy with insufficient antenatal care, third trimester: Secondary | ICD-10-CM | POA: Diagnosis not present

## 2017-04-12 DIAGNOSIS — O99213 Obesity complicating pregnancy, third trimester: Secondary | ICD-10-CM | POA: Diagnosis not present

## 2017-04-12 DIAGNOSIS — O35EXX0 Maternal care for other (suspected) fetal abnormality and damage, fetal genitourinary anomalies, not applicable or unspecified: Secondary | ICD-10-CM

## 2017-04-13 ENCOUNTER — Encounter: Payer: Medicaid Other | Admitting: Obstetrics

## 2017-04-13 DIAGNOSIS — Z3A37 37 weeks gestation of pregnancy: Secondary | ICD-10-CM | POA: Diagnosis not present

## 2017-04-13 DIAGNOSIS — O358XX0 Maternal care for other (suspected) fetal abnormality and damage, not applicable or unspecified: Secondary | ICD-10-CM | POA: Diagnosis not present

## 2017-04-20 ENCOUNTER — Inpatient Hospital Stay (HOSPITAL_COMMUNITY)
Admission: AD | Admit: 2017-04-20 | Discharge: 2017-04-20 | Disposition: A | Discharge status: Home / Self Care | Payer: Medicaid Other | Source: Ambulatory Visit | Attending: Obstetrics & Gynecology | Admitting: Obstetrics & Gynecology

## 2017-04-20 ENCOUNTER — Inpatient Hospital Stay (HOSPITAL_COMMUNITY): Payer: Medicaid Other

## 2017-04-20 ENCOUNTER — Encounter (HOSPITAL_COMMUNITY): Payer: Self-pay

## 2017-04-20 DIAGNOSIS — O368330 Maternal care for abnormalities of the fetal heart rate or rhythm, third trimester, not applicable or unspecified: Secondary | ICD-10-CM | POA: Diagnosis not present

## 2017-04-20 DIAGNOSIS — O24410 Gestational diabetes mellitus in pregnancy, diet controlled: Secondary | ICD-10-CM | POA: Diagnosis not present

## 2017-04-20 DIAGNOSIS — Z3A38 38 weeks gestation of pregnancy: Secondary | ICD-10-CM | POA: Diagnosis not present

## 2017-04-20 DIAGNOSIS — O479 False labor, unspecified: Secondary | ICD-10-CM

## 2017-04-20 DIAGNOSIS — O471 False labor at or after 37 completed weeks of gestation: Secondary | ICD-10-CM | POA: Insufficient documentation

## 2017-04-20 DIAGNOSIS — O368390 Maternal care for abnormalities of the fetal heart rate or rhythm, unspecified trimester, not applicable or unspecified: Secondary | ICD-10-CM

## 2017-04-20 NOTE — Discharge Instructions (Signed)
Braxton Hicks Contractions °Contractions of the uterus can occur throughout pregnancy, but they are not always a sign that you are in labor. You may have practice contractions called Braxton Hicks contractions. These false labor contractions are sometimes confused with true labor. °What are Braxton Hicks contractions? °Braxton Hicks contractions are tightening movements that occur in the muscles of the uterus before labor. Unlike true labor contractions, these contractions do not result in opening (dilation) and thinning of the cervix. Toward the end of pregnancy (32-34 weeks), Braxton Hicks contractions can happen more often and may become stronger. These contractions are sometimes difficult to tell apart from true labor because they can be very uncomfortable. You should not feel embarrassed if you go to the hospital with false labor. °Sometimes, the only way to tell if you are in true labor is for your health care provider to look for changes in the cervix. The health care provider will do a physical exam and may monitor your contractions. If you are not in true labor, the exam should show that your cervix is not dilating and your water has not broken. °If there are other health problems associated with your pregnancy, it is completely safe for you to be sent home with false labor. You may continue to have Braxton Hicks contractions until you go into true labor. °How to tell the difference between true labor and false labor °True labor °· Contractions last 30-70 seconds. °· Contractions become very regular. °· Discomfort is usually felt in the top of the uterus, and it spreads to the lower abdomen and low back. °· Contractions do not go away with walking. °· Contractions usually become more intense and increase in frequency. °· The cervix dilates and gets thinner. °False labor °· Contractions are usually shorter and not as strong as true labor contractions. °· Contractions are usually irregular. °· Contractions  are often felt in the front of the lower abdomen and in the groin. °· Contractions may go away when you walk around or change positions while lying down. °· Contractions get weaker and are shorter-lasting as time goes on. °· The cervix usually does not dilate or become thin. °Follow these instructions at home: °· Take over-the-counter and prescription medicines only as told by your health care provider. °· Keep up with your usual exercises and follow other instructions from your health care provider. °· Eat and drink lightly if you think you are going into labor. °· If Braxton Hicks contractions are making you uncomfortable: °? Change your position from lying down or resting to walking, or change from walking to resting. °? Sit and rest in a tub of warm water. °? Drink enough fluid to keep your urine pale yellow. Dehydration may cause these contractions. °? Do slow and deep breathing several times an hour. °· Keep all follow-up prenatal visits as told by your health care provider. This is important. °Contact a health care provider if: °· You have a fever. °· You have continuous pain in your abdomen. °Get help right away if: °· Your contractions become stronger, more regular, and closer together. °· You have fluid leaking or gushing from your vagina. °· You pass blood-tinged mucus (bloody show). °· You have bleeding from your vagina. °· You have low back pain that you never had before. °· You feel your baby’s head pushing down and causing pelvic pressure. °· Your baby is not moving inside you as much as it used to. °Summary °· Contractions that occur before labor are called Braxton   Hicks contractions, false labor, or practice contractions. °· Braxton Hicks contractions are usually shorter, weaker, farther apart, and less regular than true labor contractions. True labor contractions usually become progressively stronger and regular and they become more frequent. °· Manage discomfort from Braxton Hicks contractions by  changing position, resting in a warm bath, drinking plenty of water, or practicing deep breathing. °This information is not intended to replace advice given to you by your health care provider. Make sure you discuss any questions you have with your health care provider. °Document Released: 07/22/2016 Document Revised: 07/22/2016 Document Reviewed: 07/22/2016 °Elsevier Interactive Patient Education © 2018 Elsevier Inc. ° °

## 2017-04-20 NOTE — MAU Provider Note (Signed)
Chief Complaint:  Contractions  Provider saw patient at about 2020hrs   HPI: Sarah Mayo is a 26 y.o. G2P1001 at 38w4dwho presents to maternity admissions reporting uterine contractions.  RN was doing labor check when two isolated decels were noted. I ordered a BPP.  Fetus is active.  . She reports good fetal movement, denies LOF, vaginal bleeding, vaginal itching/burning, urinary symptoms, h/a, dizziness, n/v, diarrhea, constipation or fever/chills.  She denies headache, visual changes or RUQ abdominal pain.  Abdominal Pain  This is a new problem. The current episode started today. The onset quality is gradual. The problem occurs intermittently. The quality of the pain is cramping. Pertinent negatives include no fever, myalgias, nausea or vomiting. Nothing aggravates the pain. The pain is relieved by nothing. She has tried nothing for the symptoms.   RN Note: Here for CTX, not sure how long they've been occurring, states they are "back to back."  No LOF/VB.  Reports good FM.  Gestational Diabetic-diet controlled.  GBS neg    Past Medical History: Past Medical History:  Diagnosis Date  . Medical history non-contributory   . No pertinent past medical history   . Sickle cell trait (HCC)     Past obstetric history: OB History  Gravida Para Term Preterm AB Living  2 1 1     1  SAB TAB Ectopic Multiple Live Births        0 1    # Outcome Date GA Lbr Len/2nd Weight Sex Delivery Anes PTL Lv  2 Current           1 Term 07/23/14 [redacted]w[redacted]d 04:00 / 00:02 7 lb 10.1 oz (3.46 kg) M Vag-Spont Local  LIV     Birth Comments: large dark birth mark on R abdomen      Past Surgical History: Past Surgical History:  Procedure Laterality Date  . NO PAST SURGERIES      Family History: Family History  Problem Relation Age of Onset  . Asthma Brother   . Sickle cell trait Mother     Social History: Social History   Tobacco Use  . Smoking status: Never Smoker  . Smokeless tobacco: Never  Used  Substance Use Topics  . Alcohol use: No    Comment: Social, Last drink August 2018  . Drug use: No    Comment: quit with pregnancy    Allergies: No Known Allergies  Meds:  Medications Prior to Admission  Medication Sig Dispense Refill Last Dose  . ACCU-CHEK FASTCLIX LANCETS MISC 1 Container by Percutaneous route 4 (four) times daily. Test BS AM fasting, and 2 hours after every meal. 102 each 12 Taking  . Blood Glucose Monitoring Suppl (ACCU-CHEK GUIDE) w/Device KIT 1 Device by Does not apply route 4 (four) times daily. 1 kit 0 Taking  . Elastic Bandages & Supports (COMFORT FIT MATERNITY SUPP SM) MISC Wear as directed. 1 each 0 Taking  . glucose blood test strip Use as instructed 100 each 12 Taking  . Prenatal Vit-Fe Fumarate-FA (PRENATAL MULTIVITAMIN) TABS tablet Take 1 tablet by mouth daily at 12 noon.   Taking    I have reviewed patient's Past Medical Hx, Surgical Hx, Family Hx, Social Hx, medications and allergies.   ROS:  Review of Systems  Constitutional: Negative for fever.  Gastrointestinal: Positive for abdominal pain. Negative for nausea and vomiting.  Musculoskeletal: Negative for myalgias.   Other systems negative  Physical Exam   Patient Vitals for the past 24 hrs:  BP   Temp Temp src Pulse Resp SpO2 Height Weight  04/20/17 1910 96/69 98.6 F (37 C) Oral (!) 110 19 98 % - -  04/20/17 1904 - - - - - - 5' 9" (1.753 m) 257 lb (116.6 kg)   Constitutional: Well-developed, well-nourished female in no acute distress.  Cardiovascular: normal rate and rhythm Respiratory: normal effort, clear to auscultation bilaterally GI: Abd soft, non-tender, gravid appropriate for gestational age.   No rebound or guarding. MS: Extremities nontender, no edema, normal ROM Neurologic: Alert and oriented x 4.  GU: Neg CVAT.  PELVIC EXAM: Dilation: Closed Effacement (%): Thick Cervical Position: Posterior Exam by:: Anna Cioce RN  FHT:  Baseline 135 , moderate variability,  accelerations present, no decelerations Contractions:  Irregular     Labs: No results found for this or any previous visit (from the past 24 hour(s)).  A/Positive/-- (09/26 1340)  Imaging:  Us Mfm Fetal Bpp Wo Non Stress  Result Date: 04/01/2017 ----------------------------------------------------------------------  OBSTETRICS REPORT                      (Signed Final 04/01/2017 09:20 am) ---------------------------------------------------------------------- Patient Info  ID #:       [id number]                          D.O.B.:  10/05/1990 (26 yrs)  Name:       Tayloranne D Friel              Visit Date: 03/31/2017 08:11 pm ---------------------------------------------------------------------- Performed By  Performed By:     Susan M Kennedy        Referred By:      MAU Nursing-                    RDMS                                     MAU/Triage  Attending:        Emily Bunce MD         [facility] ---------------------------------------------------------------------- Orders   #  Description                                 Code   1  US MFM FETAL BPP WO NON STRESS              76819.01  ----------------------------------------------------------------------   #  Ordered By               Order #        Accession #    Episode #   1  ROLITTA DAWSON           [id number]      [id number]     [id number]  ---------------------------------------------------------------------- Indications   [redacted] weeks gestation of pregnancy                Z3A.35   Decreased fetal movement                       O36.8190   Non-reactive NST                                 O28.9   Gestational diabetes in pregnancy, diet        O24.410   controlled   Late to prenatal care, third trimester         O09.33   Marijuana Abuse   Pyelectasis of fetus on prenatal ultrasound    K55.3   Obesity complicating pregnancy, third          O99.213   trimester   Pyelectasis of fetus on prenatal ultrasound    O28.3   History of sickle cell  trait                   Z86.2  ---------------------------------------------------------------------- OB History  Blood Type:            Height:  5'9"   Weight (lb):  243       BMI:  35.88  Gravidity:    2         Term:   1        Prem:   0        SAB:   0  TOP:          0       Ectopic:  0        Living: 1 ---------------------------------------------------------------------- Fetal Evaluation  Num Of Fetuses:     1  Fetal Heart         144  Rate(bpm):  Cardiac Activity:   Observed  Presentation:       Cephalic  Amniotic Fluid  AFI FV:      Subjectively within normal limits  AFI Sum(cm)     %Tile       Largest Pocket(cm)  15.55           57          6.26  RUQ(cm)       RLQ(cm)       LUQ(cm)        LLQ(cm)  6.26          1.38          4.07           3.84 ---------------------------------------------------------------------- Biophysical Evaluation  Amniotic F.V:   Within normal limits       F. Tone:        Observed  F. Movement:    Observed                   Score:          8/8  F. Breathing:   Observed ---------------------------------------------------------------------- Gestational Age  LMP:           36w 2d        Date:  07/20/16                 EDD:   04/26/17  Best:          35w 5d     Det. By:  U/S  (12/14/16)          EDD:   04/30/17 ---------------------------------------------------------------------- Anatomy  Stomach:               Appears normal, left   Bladder:                Appears normal                         sided ---------------------------------------------------------------------- Impression  Single living intrauterine pregnancy at [place].  Cephalic presentation.  Normal amniotic fluid volume.  BPP 8/8. ---------------------------------------------------------------------- Recommendations  Continue clinical evaluation and management.  Keep previously scheduled ultrasound on 04/12/17 for  reevaluation of fetal urinary tract dilation.  ----------------------------------------------------------------------                   Abram Sander, MD Electronically Signed Final Report   04/01/2017 09:20 am ----------------------------------------------------------------------  [place] Mfm Ob Follow Up  Result Date: 04/12/2017 ----------------------------------------------------------------------  OBSTETRICS REPORT                      (Signed Final 04/12/2017 11:12 am) ---------------------------------------------------------------------- Patient Info  ID #:       [id number]                          D.O.B.:  10-Aug-1990 (26 yrs)  Name:       JOVON STREETMAN Birdsall              Visit Date: 04/12/2017 10:30 am ---------------------------------------------------------------------- Performed By  Performed By:     Valda Favia          Ref. Address:     [facility]                                                             [place]                                                             [place], [place]  Attending:        Abram Sander MD         Location:         [facility]  Referred By:      [facility] ---------------------------------------------------------------------- Orders   #  Description                                 Code   1  [place] MFM OB FOLLOW UP                         [id number]  ----------------------------------------------------------------------   #  Ordered By               Order #        Accession #    Episode #   1  [place]              [id number]      [id number]     [id number]  ---------------------------------------------------------------------- Indications   [redacted] weeks gestation of pregnancy                Z3A.37    Gestational diabetes in pregnancy, diet        O24.410   controlled   Late to prenatal care, third trimester         O09.33   Marijuana Abuse   Pyelectasis of fetus on prenatal ultrasound    S14.2   Obesity complicating pregnancy, third          O99.213   trimester  ---------------------------------------------------------------------- OB History  Blood Type:            Height:  5'9"   Weight (lb):  243       BMI:  35.88  Gravidity:    2         Term:   1        Prem:   0        SAB:   0  TOP:          0       Ectopic:  0        Living: 1 ---------------------------------------------------------------------- Fetal Evaluation  Num Of Fetuses:     1  Fetal Heart         140  Rate(bpm):  Cardiac Activity:   Observed  Presentation:       Cephalic  Placenta:           Posterior, above cervical os  P. Cord Insertion:  Visualized, central  Amniotic Fluid  AFI FV:      Subjectively within normal limits  AFI Sum(cm)     %Tile       Largest Pocket(cm)  14.54           55          4.35  RUQ(cm)       RLQ(cm)       LUQ(cm)        LLQ(cm)  4.35          4.09          3.33           2.77 ---------------------------------------------------------------------- Biometry  BPD:      83.7  mm     G. Age:  33w 5d          1  %    CI:        73.13   %    70 - 86  FL/HC:      22.6   %    20.8 - 22.6  HC:      311.1  mm     G. Age:  34w 6d        < 3  %    HC/AC:      0.87        0.92 - 1.05  AC:      357.4  mm     G. Age:  39w 4d       > 97  %    FL/BPD:     84.0   %    71 - 87  FL:       70.3  mm     G. Age:  36w 0d         19  %    FL/AC:      19.7   %    20 - 24  HUM:      62.1  mm     G. Age:  36w 0d         46  %  Est. FW:    3236  gm      7 lb 2 oz     75  % ---------------------------------------------------------------------- Gestational Age  LMP:           38w 0d        Date:  07/20/16                 EDD:   04/26/17  U/S Today:     36w 0d                                         EDD:   05/10/17  Best:          37w 3d     Det. By:  U/S  (12/14/16)          EDD:   04/30/17 ---------------------------------------------------------------------- Anatomy  Cranium:               Appears normal         Aortic Arch:            Previously seen  Cavum:                 Previously seen        Ductal Arch:            Previously seen  Ventricles:            Appears normal         Diaphragm:              Previously seen  Choroid Plexus:        Previously seen        Stomach:                Appears normal, left                                                                        sided  Cerebellum:  Previously seen        Abdomen:                Previously seen  Posterior Fossa:       Previously seen        Abdominal Wall:         Previously seen  Nuchal Fold:           Not applicable (>20    Cord Vessels:           Previously seen                         wks GA)  Face:                  Orbits and profile     Kidneys:                Bilat pyel., Rt                         previously seen                                                                        11mm, Lt 9mm  Lips:                  Previously seen        Bladder:                Appears normal  Thoracic:              Appears normal         Spine:                  Previously seen  Heart:                 Appears normal         Upper Extremities:      Previously seen                         (4CH, axis, and situs  RVOT:                  Previously seen        Lower Extremities:      Previously seen  LVOT:                  Previously seen  Other:  Parents do not wish to know sex of fetus. Fetus appears to be a          female. Technically difficult due to maternal habitus and fetal position. ---------------------------------------------------------------------- Cervix Uterus Adnexa  Cervix  Not visualized (advanced GA >29wks)  Uterus  No abnormality visualized.  Left Ovary  No adnexal mass visualized.  Right Ovary  No adnexal mass  visualized.  Cul De Sac:   No free fluid seen.  Adnexa:       No abnormality visualized. ---------------------------------------------------------------------- Impression  Single living intrauterine pregnancy at 37w 3d.  Placenta Posterior, above cervical os.  Appropriate fetal growth. AC>97%ile.  Normal amniotic fluid volume.  The right renal pelvis measures 11mm   with peripheral  calyceal dilation consistent with urinary tract dilation class A2-  3. The left kidney measures 9mm without peripheral calyceal  dilation noted consistent with urinary tract dilation class A1.  No ureteral dilation, ureterocele or renal cortical thinning or  echogenicity is visualized bilaterally.  The bladder appear  normal.  Otherwise normal fetal anatomy. ---------------------------------------------------------------------- Recommendations  Findings of today's ultrasound reviewed.  Referred to pediatric urology.  Follow-up ultrasounds as clinically indicated. ----------------------------------------------------------------------                   Emily Bunce, MD Electronically Signed Final Report   04/12/2017 11:12 am ----------------------------------------------------------------------   MAU Course/MDM: I have ordered labs and reviewed results.  NST reviewed and generally reassuring with two isolated decels with quick return. BPP ordered for reassurance, which was 8/8  Treatments in MAU included EFM.    Assessment: 1. Fetal heart rate decelerations affecting management of mother   2. False labor   3.     Reassuring fetal status  Plan: Discharge home Labor precautions and fetal kick counts Follow up in Office for prenatal visits and recheck of status  Follow-up Information    [facility] Follow up.   Specialty:  Obstetrics and Gynecology Contact information: [address], [address] [place] [address] [phone]          Pt stable at time of  discharge.    CNM, MSN Certified Nurse-Midwife 04/20/2017 9:21 PM  

## 2017-04-20 NOTE — MAU Note (Signed)
Here for CTX, not sure how long they've been occurring, states they are "back to back."  No LOF/VB.  Reports good FM.  Gestational Diabetic-diet controlled.  GBS neg.

## 2017-04-20 NOTE — MAU Note (Signed)
I have communicated with Dr. Rushie GoltzLeland and reviewed vital signs:  Vitals:   04/20/17 1910  BP: 96/69  Pulse: (!) 110  Resp: 19  Temp: 98.6 F (37 C)  SpO2: 98%    Vaginal exam:  Dilation: Closed Effacement (%): Thick Cervical Position: Posterior Exam by:: Latricia HeftAnna Shriyan Arakawa RN,   Also reviewed contraction pattern and that non-stress test is reactive, with a Biophysical Profile of 8/8.  It has been documented that patient is contracting occasionally over 2 hours not indicating active labor.  Patient denies any other complaints.  Based on this report provider has given order for discharge.  A discharge order and diagnosis entered by a provider.   Labor discharge instructions reviewed with patient.

## 2017-04-22 ENCOUNTER — Ambulatory Visit (HOSPITAL_COMMUNITY)
Admission: EM | Admit: 2017-04-22 | Discharge: 2017-04-22 | Disposition: A | Discharge status: Home / Self Care | Payer: Medicaid Other | Attending: Internal Medicine | Admitting: Internal Medicine

## 2017-04-22 ENCOUNTER — Encounter (HOSPITAL_COMMUNITY): Payer: Self-pay | Admitting: Family Medicine

## 2017-04-22 DIAGNOSIS — L0291 Cutaneous abscess, unspecified: Secondary | ICD-10-CM | POA: Diagnosis not present

## 2017-04-22 MED ORDER — CEPHALEXIN 500 MG PO CAPS: 500.0000 mg | ORAL_CAPSULE | Freq: Four times a day (QID) | ORAL | 0 refills | Status: AC

## 2017-04-22 NOTE — ED Provider Notes (Signed)
[place]    CSN: [id number] Arrival date & time: 04/22/17  1021     History   Chief Complaint Chief Complaint  Patient presents with  . Abscess    HPI Sarah Mayo is a 27 y.o. female currently [redacted] weeks pregnant presenting with concern of abscess to her left groin.  States that she shaved last week and over the past few days she has noticed an enlargement around a hair follicle.  Hurts with movement.  She is applied warm compresses without relief.  Denies having any previous abscesses.  She is due in 8 days.  HPI  Past Medical History:  Diagnosis Date  . Medical history non-contributory   . No pertinent past medical history   . Sickle cell trait [facility])     Patient Active Problem List   Diagnosis Date Noted  . Abdominal pain in pregnancy, third trimester 03/29/2017  . Obesity affecting pregnancy, antepartum, third trimester 01/12/2017  . Fetal abnormality during pregnancy, antepartum 01/12/2017  . Encounter for repeat ultrasound of fetal pyelectasis in singleton pregnancy, antepartum 12/16/2016  . Supervision of other normal pregnancy, antepartum 12/15/2016  . BMI 32.0-32.9,adult 07/23/2014  . Sickle cell trait ([place]) 07/23/2014  . 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 History of marijuana use 07/23/2014    Past Surgical History:  Procedure Laterality Date  . NO PAST SURGERIES      OB History    Gravida Para Term Preterm AB Living   '2 1 1     1   '$ SAB TAB Ectopic Multiple Live Births         0 1       Home Medications    Prior to Admission medications   Medication Sig Start Date End Date Taking? Authorizing Provider  Elastic Bandages & Supports (COMFORT FIT MATERNITY SUPP SM) MISC Wear as directed. 03/03/17  Yes Shelly Bombard, MD  ACCU-CHEK FASTCLIX LANCETS MISC 1 Container by Percutaneous route 4 (four) times daily. Test BS AM fasting, and 2 hours after every meal. 02/14/17 03/16/17  Chancy Milroy, MD  Blood Glucose Monitoring Suppl  (ACCU-CHEK GUIDE) w/Device KIT 1 Device by Does not apply route 4 (four) times daily. 02/14/17   Chancy Milroy, MD  cephALEXin (KEFLEX) 500 MG capsule Take 1 capsule (500 mg total) by mouth 4 (four) times daily for 7 days. 04/22/17 04/29/17  Wieters, Hallie C, PA-C  glucose blood test strip Use as instructed 02/14/17   Chancy Milroy, MD  Prenatal Vit-Fe Fumarate-FA (PRENATAL MULTIVITAMIN) TABS tablet Take 1 tablet by mouth daily at 12 noon.    [provider]    Family History Family History  Problem Relation Age of Onset  . Asthma Brother   . Sickle cell trait Mother     Social History Social History   Tobacco Use  . Smoking status: Never Smoker  . Smokeless tobacco: Never Used  Substance Use Topics  . Alcohol use: No    Comment: Social, Last drink August 2018  . Drug use: No    Comment: quit with pregnancy     Allergies   Patient has no known allergies.   Review of Systems Review of Systems  Constitutional: Negative for chills and fever.  Respiratory: Negative for shortness of breath.   Cardiovascular: Negative for chest pain.  Gastrointestinal: Negative for abdominal pain, diarrhea, nausea and vomiting.  Genitourinary: Positive for genital sores. Negative for dysuria, flank pain, hematuria, menstrual problem, vaginal bleeding, vaginal discharge  and vaginal pain.  Musculoskeletal: Negative for back pain.  Skin: Negative for rash.  Neurological: Negative for dizziness, light-headedness and headaches.  All other systems reviewed and are negative.    Physical Exam Triage Vital Signs ED Triage Vitals  Enc Vitals Group     BP 04/22/17 1122 (!) 117/58     Pulse Rate 04/22/17 1122 (!) 112     Resp 04/22/17 1122 18     Temp 04/22/17 1122 98.5 F (36.9 C)     Temp src --      SpO2 --      Weight --      Height --      Head Circumference --      Peak Flow --      Pain Score 04/22/17 1121 10     Pain Loc --      Pain Edu? --      Excl. in [place]? --     No data found.  Updated Vital Signs BP (!) 117/58   Pulse (!) 112   Temp 98.5 F (36.9 C)   Resp 18   LMP 07/20/2016 (Within Days)    Physical Exam  Constitutional: She is oriented to person, place, and time. She appears well-developed and well-nourished. No distress.  HENT:  Head: Normocephalic and atraumatic.  Eyes: Conjunctivae are normal.  Neck: Neck supple.  Cardiovascular: Normal rate.  Pulmonary/Chest: Effort normal. No respiratory distress.  Musculoskeletal: She exhibits no edema.  Neurological: She is alert and oriented to person, place, and time.  Skin: Skin is warm and dry.  2 cm size abscess to the left side of labia near the inguinal fold, spontaneously begins draining as patient undresses, pustular and bloody drainage expressed from abscess.  Psychiatric: She has a normal mood and affect.  Nursing note and vitals reviewed.    UC Treatments / Results  Labs (all labs ordered are listed, but only abnormal results are displayed) Labs Reviewed - No data to display  EKG  EKG Interpretation None       Radiology No results found.  Procedures Procedures (including critical care time)  Medications Ordered in UC Medications - No data to display   Initial Impression / Assessment and Plan / UC Course  I have reviewed the triage vital signs and the nursing notes.  Pertinent labs & imaging results that were available during my care of the patient were reviewed by me and considered in my medical decision making (see chart for details).     Patient with spontaneously draining abscess to groin.  Continue warm compresses and warm sits baths with massaging to encourage further drainage.  Will provide Keflex as this is safe in pregnancy for 7 days.  Final Clinical Impressions(s) / UC Diagnoses   Final diagnoses:  Abscess    ED Discharge Orders        Ordered    cephALEXin (KEFLEX) 500 MG capsule  4 times daily     04/22/17 1152       Controlled  Substance Prescriptions [place] Controlled Substance Registry consulted? Not Applicable   Janith Lima, [place] 04/22/17 2130

## 2017-04-22 NOTE — ED Triage Notes (Signed)
Pt here for abscess to groin area for the past few days. Using warm compresses and not better. No drainage.

## 2017-04-28 ENCOUNTER — Ambulatory Visit (INDEPENDENT_AMBULATORY_CARE_PROVIDER_SITE_OTHER): Payer: Medicaid Other | Admitting: Obstetrics

## 2017-04-28 ENCOUNTER — Encounter: Payer: Self-pay | Admitting: Obstetrics

## 2017-04-28 VITALS — BP 126/83 | HR 103 | Wt 256.4 lb

## 2017-04-28 DIAGNOSIS — Z3480 Encounter for supervision of other normal pregnancy, unspecified trimester: Secondary | ICD-10-CM

## 2017-04-28 DIAGNOSIS — O0993 Supervision of high risk pregnancy, unspecified, third trimester: Secondary | ICD-10-CM

## 2017-04-28 DIAGNOSIS — O358XX0 Maternal care for other (suspected) fetal abnormality and damage, not applicable or unspecified: Secondary | ICD-10-CM

## 2017-04-28 DIAGNOSIS — O35EXX0 Maternal care for other (suspected) fetal abnormality and damage, fetal genitourinary anomalies, not applicable or unspecified: Secondary | ICD-10-CM

## 2017-04-28 DIAGNOSIS — O99213 Obesity complicating pregnancy, third trimester: Secondary | ICD-10-CM

## 2017-04-28 DIAGNOSIS — E669 Obesity, unspecified: Secondary | ICD-10-CM

## 2017-04-28 DIAGNOSIS — O24410 Gestational diabetes mellitus in pregnancy, diet controlled: Secondary | ICD-10-CM

## 2017-04-28 DIAGNOSIS — O0990 Supervision of high risk pregnancy, unspecified, unspecified trimester: Secondary | ICD-10-CM

## 2017-04-28 NOTE — Progress Notes (Signed)
Subjective:  Sarah Mayo is a 27 y.o. G2P1001 at [redacted]w[redacted]d being seen today for ongoing prenatal care.  She is currently monitored for the following issues for this high-risk pregnancy and has BMI 32.0-32.9,adult; Sickle cell trait (HCC); Hemoglobin C trait (HCC); History of marijuana use; Supervision of other normal pregnancy, antepartum; Encounter for repeat ultrasound of fetal pyelectasis in singleton pregnancy, antepartum; Obesity affecting pregnancy, antepartum, third trimester; Fetal abnormality during pregnancy, antepartum; and Abdominal pain in pregnancy, third trimester on their problem list.  Patient reports occasional contractions.  Contractions: Regular. Vag. Bleeding: None.  Movement: Present. Denies leaking of fluid.   The following portions of the patient's history were reviewed and updated as appropriate: allergies, current medications, past family history, past medical history, past social history, past surgical history and problem list. Problem list updated.  Objective:   Vitals:   04/28/17 1456  BP: 126/83  Pulse: (!) 103  Weight: 256 lb 6.4 oz (116.3 kg)    Fetal Status: Fetal Heart Rate (bpm): 150   Movement: Present     General:  Alert, oriented and cooperative. Patient is in no acute distress.  Skin: Skin is warm and dry. No rash noted.   Cardiovascular: Normal heart rate noted  Respiratory: Normal respiratory effort, no problems with respiration noted  Abdomen: Soft, gravid, appropriate for gestational age. Pain/Pressure: Absent     Pelvic:  Cervical exam deferred        Extremities: Normal range of motion.  Edema: Trace  Mental Status: Normal mood and affect. Normal behavior. Normal judgment and thought content.   Urinalysis:    WNL's  [place] MFM OB FOLLOW UP (Accession [id number]) (Order [id number])  Imaging  Date: 04/12/2017 Department: [facility] MATERNAL FETAL CARE ULTRASOUND Released By: Genevie Cheshire, RT Authorizing: Allegra Lai, MD  Exam  Information   Status Exam Begun  Exam Ended   Final [99] 04/12/2017 10:15 AM 04/12/2017 10:57 AM  PACS Images   Show images for [place] MFM OB FOLLOW UP  Study Result   ----------------------------------------------------------------------  OBSTETRICS REPORT                      (Signed Final 04/12/2017 11:12 am) ---------------------------------------------------------------------- Patient Info  ID #:       [id number]                          D.O.B.:  1991/01/21 (26 yrs)  Name:       Sarah Mayo              Visit Date: 04/12/2017 10:30 am ---------------------------------------------------------------------- Performed By  Performed By:     Percell Boston          Ref. Address:     [facility]                    RDMS                                                             [facility]  [address]                                                             [place], [place]                                                             [address]  Attending:        Darlyn Read MD         Location:         [facility]  Referred By:      [facility] for                    [facility]                    Healthcare ---------------------------------------------------------------------- Orders   #  Description                                 Code   1  [place] MFM OB FOLLOW UP                         13086.57  ----------------------------------------------------------------------   #  Ordered By               Order #        Accession #    Episode #   1  Charlsie Merles              [id number]      [id number]     [id number]  ---------------------------------------------------------------------- Indications   [redacted] weeks gestation of pregnancy                Z3A.37   Gestational diabetes in pregnancy, diet        O24.410   controlled   Late to  prenatal care, third trimester         O09.33   Marijuana Abuse   Pyelectasis of fetus on prenatal ultrasound    O28.3   Obesity complicating pregnancy, third          O99.213   trimester  ---------------------------------------------------------------------- OB History  Blood Type:            Height:  5'9"   Weight (lb):  243       BMI:  35.88  Gravidity:    2         Term:   1        Prem:   0  SAB:   0  TOP:          0       Ectopic:  0        Living: 1 ---------------------------------------------------------------------- Fetal Evaluation  Num Of Fetuses:     1  Fetal Heart         140  Rate(bpm):  Cardiac Activity:   Observed  Presentation:       Cephalic  Placenta:           Posterior, above cervical os  P. Cord Insertion:  Visualized, central  Amniotic Fluid  AFI FV:      Subjectively within normal limits  AFI Sum(cm)     %Tile       Largest Pocket(cm)  14.54           55          4.35  RUQ(cm)       RLQ(cm)       LUQ(cm)        LLQ(cm)  4.35          4.09          3.33           2.77 ---------------------------------------------------------------------- Biometry  BPD:      83.7  mm     G. Age:  33w 5d          1  %    CI:        73.13   %    70 - 86                                                          FL/HC:      22.6   %    20.8 - 22.6  HC:      311.1  mm     G. Age:  34w 6d        < 3  %    HC/AC:      0.87        0.92 - 1.05  AC:      357.4  mm     G. Age:  39w 4d       > 97  %    FL/BPD:     84.0   %    71 - 87  FL:       70.3  mm     G. Age:  36w 0d         19  %    FL/AC:      19.7   %    20 - 24  HUM:      62.1  mm     G. Age:  36w 0d         46  %  Est. FW:    3236  gm      7 lb 2 oz     75  % ---------------------------------------------------------------------- Gestational Age  LMP:           38w 0d        Date:  07/20/16                 EDD:   04/26/17  U/S Today:     36w 0d  EDD:   05/10/17  Best:          37w  3d     Det. By:  U/S  (12/14/16)          EDD:   04/30/17 ---------------------------------------------------------------------- Anatomy  Cranium:               Appears normal         Aortic Arch:            Previously seen  Cavum:                 Previously seen        Ductal Arch:            Previously seen  Ventricles:            Appears normal         Diaphragm:              Previously seen  Choroid Plexus:        Previously seen        Stomach:                Appears normal, left                                                                        sided  Cerebellum:            Previously seen        Abdomen:                Previously seen  Posterior Fossa:       Previously seen        Abdominal Wall:         Previously seen  Nuchal Fold:           Not applicable (>20    Cord Vessels:           Previously seen                         wks GA)  Face:                  Orbits and profile     Kidneys:                Bilat pyel., Rt                         previously seen                                                                        11mm, Lt 9mm  Lips:                  Previously seen        Bladder:                Appears normal  Thoracic:  Appears normal         Spine:                  Previously seen  Heart:                 Appears normal         Upper Extremities:      Previously seen                         (4CH, axis, and situs  RVOT:                  Previously seen        Lower Extremities:      Previously seen  LVOT:                  Previously seen  Other:  Parents do not wish to know sex of fetus. Fetus appears to be a          female. Technically difficult due to maternal habitus and fetal position. ---------------------------------------------------------------------- Cervix Uterus Adnexa  Cervix  Not visualized (advanced GA >29wks)  Uterus  No abnormality visualized.  Left Ovary  No adnexal mass visualized.  Right Ovary  No adnexal mass visualized.   Cul De Sac:   No free fluid seen.  Adnexa:       No abnormality visualized. ---------------------------------------------------------------------- Impression  Single living intrauterine pregnancy at 37w 3d.  Placenta Posterior, above cervical os.  Appropriate fetal growth. AC>97%ile.  Normal amniotic fluid volume.  The right renal pelvis measures 11mm with peripheral  calyceal dilation consistent with urinary tract dilation class A2-  3. The left kidney measures 9mm without peripheral calyceal  dilation noted consistent with urinary tract dilation class A1.  No ureteral dilation, ureterocele or renal cortical thinning or  echogenicity is visualized bilaterally.  The bladder appear  normal.  Otherwise normal fetal anatomy. ---------------------------------------------------------------------- Recommendations  Findings of today's ultrasound reviewed.  Referred to pediatric urology.  Follow-up ultrasounds as clinically indicated. ----------------------------------------------------------------------                   Darlyn ReadEmily Bunce, MD Electronically Signed Final Report   04/12/2017 11:12 am ----------------------------------------------------------------------    Assessment and Plan:  Pregnancy: G2P1001 at 230w5d  1. Supervision of high risk pregnancy, antepartum  2. Diet controlled White classification A1 gestational diabetes mellitus (GDM) - good glucose control - appropriate interval growth ( EFW = 75%tile ), but AC is > 97%tile  3. Pyelectasis of fetus on prenatal ultrasound - referred to Pediatric Urology  4. Obesity affecting pregnancy, antepartum, third trimester   There are no diagnoses linked to this encounter. Term labor symptoms and general obstetric precautions including but not limited to vaginal bleeding, contractions, leaking of fluid and fetal movement were reviewed in detail with the patient. Please refer to After Visit Summary for other counseling  recommendations.  Return in about 1 week (around 05/05/2017) for ROB.   Brock BadHarper, Charles A, MD

## 2017-04-29 ENCOUNTER — Telehealth (HOSPITAL_COMMUNITY): Payer: Self-pay | Admitting: *Deleted

## 2017-04-29 NOTE — Telephone Encounter (Signed)
Preadmission screen  

## 2017-04-30 ENCOUNTER — Other Ambulatory Visit (HOSPITAL_COMMUNITY): Payer: Self-pay | Admitting: Advanced Practice Midwife

## 2017-05-02 ENCOUNTER — Encounter (HOSPITAL_COMMUNITY): Payer: Self-pay | Admitting: *Deleted

## 2017-05-02 ENCOUNTER — Telehealth (HOSPITAL_COMMUNITY): Payer: Self-pay | Admitting: *Deleted

## 2017-05-02 NOTE — Telephone Encounter (Signed)
Preadmission screen  

## 2017-05-03 ENCOUNTER — Encounter (HOSPITAL_COMMUNITY): Payer: Self-pay | Admitting: *Deleted

## 2017-05-03 ENCOUNTER — Inpatient Hospital Stay (HOSPITAL_COMMUNITY): Payer: Medicaid Other | Admitting: Anesthesiology

## 2017-05-03 ENCOUNTER — Inpatient Hospital Stay (HOSPITAL_COMMUNITY)
Admission: AD | Admit: 2017-05-03 | Discharge: 2017-05-05 | DRG: 807 | Disposition: A | Discharge status: Home / Self Care | Payer: Medicaid Other | Source: Ambulatory Visit | Attending: Family Medicine | Admitting: Family Medicine

## 2017-05-03 ENCOUNTER — Other Ambulatory Visit: Payer: Self-pay

## 2017-05-03 DIAGNOSIS — Z3A40 40 weeks gestation of pregnancy: Secondary | ICD-10-CM

## 2017-05-03 DIAGNOSIS — O24420 Gestational diabetes mellitus in childbirth, diet controlled: Secondary | ICD-10-CM | POA: Diagnosis present

## 2017-05-03 DIAGNOSIS — O358XX0 Maternal care for other (suspected) fetal abnormality and damage, not applicable or unspecified: Secondary | ICD-10-CM | POA: Diagnosis present

## 2017-05-03 DIAGNOSIS — O24429 Gestational diabetes mellitus in childbirth, unspecified control: Secondary | ICD-10-CM | POA: Diagnosis not present

## 2017-05-03 DIAGNOSIS — O99214 Obesity complicating childbirth: Secondary | ICD-10-CM | POA: Diagnosis present

## 2017-05-03 DIAGNOSIS — E669 Obesity, unspecified: Secondary | ICD-10-CM | POA: Diagnosis present

## 2017-05-03 DIAGNOSIS — O9902 Anemia complicating childbirth: Secondary | ICD-10-CM | POA: Diagnosis present

## 2017-05-03 DIAGNOSIS — O35EXX0 Maternal care for other (suspected) fetal abnormality and damage, fetal genitourinary anomalies, not applicable or unspecified: Secondary | ICD-10-CM

## 2017-05-03 DIAGNOSIS — D573 Sickle-cell trait: Secondary | ICD-10-CM | POA: Diagnosis present

## 2017-05-03 DIAGNOSIS — O99213 Obesity complicating pregnancy, third trimester: Secondary | ICD-10-CM

## 2017-05-03 LAB — TYPE AND SCREEN
ABO/RH(D): A POS
ANTIBODY SCREEN: NEGATIVE

## 2017-05-03 LAB — RPR: RPR Ser Ql: NONREACTIVE

## 2017-05-03 LAB — GLUCOSE, CAPILLARY
Glucose-Capillary: 93 mg/dL (ref 65–99)
Glucose-Capillary: 103 mg/dL — ABNORMAL HIGH (ref 65–99)

## 2017-05-03 LAB — CBC
HEMATOCRIT: 32.5 % — AB (ref 36.0–46.0)
HEMOGLOBIN: 11.5 g/dL — AB (ref 12.0–15.0)
MCH: 26.9 pg (ref 26.0–34.0)
MCHC: 35.4 g/dL (ref 30.0–36.0)
MCV: 75.9 fL — ABNORMAL LOW (ref 78.0–100.0)
Platelets: 297 10*3/uL (ref 150–400)
RBC: 4.28 MIL/uL (ref 3.87–5.11)
RDW: 15.4 % (ref 11.5–15.5)
WBC: 11.5 10*3/uL — AB (ref 4.0–10.5)

## 2017-05-03 MED ORDER — EPHEDRINE 5 MG/ML INJ
10.0000 mg | INTRAVENOUS | Status: DC | PRN
  Filled 2017-05-03: qty 2

## 2017-05-03 MED ORDER — ONDANSETRON HCL 4 MG/2ML IJ SOLN: 4.0000 mg | INTRAMUSCULAR | Status: DC | PRN

## 2017-05-03 MED ORDER — TETANUS-DIPHTH-ACELL PERTUSSIS 5-2.5-18.5 LF-MCG/0.5 IM SUSP: 0.5000 mL | Freq: Once | INTRAMUSCULAR | Status: DC

## 2017-05-03 MED ORDER — ZOLPIDEM TARTRATE 5 MG PO TABS: 5.0000 mg | ORAL_TABLET | Freq: Every evening | ORAL | Status: DC | PRN

## 2017-05-03 MED ORDER — OXYTOCIN BOLUS FROM INFUSION: 500.0000 mL | Freq: Once | INTRAVENOUS | Status: DC

## 2017-05-03 MED ORDER — DIBUCAINE 1 % RE OINT: 1.0000 "application " | TOPICAL_OINTMENT | RECTAL | Status: DC | PRN

## 2017-05-03 MED ORDER — PHENYLEPHRINE 40 MCG/ML (10ML) SYRINGE FOR IV PUSH (FOR BLOOD PRESSURE SUPPORT)
80.0000 ug | PREFILLED_SYRINGE | INTRAVENOUS | Status: DC | PRN
  Filled 2017-05-03: qty 5

## 2017-05-03 MED ORDER — LACTATED RINGERS IV SOLN: 500.0000 mL | INTRAVENOUS | Status: DC | PRN

## 2017-05-03 MED ORDER — OXYCODONE-ACETAMINOPHEN 5-325 MG PO TABS: 2.0000 | ORAL_TABLET | ORAL | Status: DC | PRN

## 2017-05-03 MED ORDER — WITCH HAZEL-GLYCERIN EX PADS: 1.0000 "application " | MEDICATED_PAD | CUTANEOUS | Status: DC | PRN

## 2017-05-03 MED ORDER — ONDANSETRON HCL 4 MG PO TABS: 4.0000 mg | ORAL_TABLET | ORAL | Status: DC | PRN

## 2017-05-03 MED ORDER — OXYCODONE-ACETAMINOPHEN 5-325 MG PO TABS: 1.0000 | ORAL_TABLET | ORAL | Status: DC | PRN

## 2017-05-03 MED ORDER — FENTANYL CITRATE (PF) 100 MCG/2ML IJ SOLN: 100.0000 ug | INTRAMUSCULAR | Status: DC | PRN

## 2017-05-03 MED ORDER — ONDANSETRON HCL 4 MG/2ML IJ SOLN: 4.0000 mg | Freq: Four times a day (QID) | INTRAMUSCULAR | Status: DC | PRN

## 2017-05-03 MED ORDER — LACTATED RINGERS IV SOLN: 500.0000 mL | Freq: Once | INTRAVENOUS | Status: DC

## 2017-05-03 MED ORDER — BENZOCAINE-MENTHOL 20-0.5 % EX AERO
1.0000 "application " | INHALATION_SPRAY | CUTANEOUS | Status: DC | PRN
  Filled 2017-05-03: qty 56

## 2017-05-03 MED ORDER — OXYTOCIN 40 UNITS IN LACTATED RINGERS INFUSION - SIMPLE MED
1.0000 m[IU]/min | INTRAVENOUS | Status: DC
  Administered 2017-05-03 (×2): 4 m[IU]/min via INTRAVENOUS
  Administered 2017-05-03: 2 m[IU]/min via INTRAVENOUS

## 2017-05-03 MED ORDER — LIDOCAINE HCL (PF) 1 % IJ SOLN
INTRAMUSCULAR | Status: DC | PRN
  Administered 2017-05-03 (×2): 4 mL via EPIDURAL

## 2017-05-03 MED ORDER — FENTANYL 2.5 MCG/ML BUPIVACAINE 1/10 % EPIDURAL INFUSION (WH - ANES)
14.0000 mL/h | INTRAMUSCULAR | Status: DC | PRN
  Administered 2017-05-03: 14 mL/h via EPIDURAL

## 2017-05-03 MED ORDER — SOD CITRATE-CITRIC ACID 500-334 MG/5ML PO SOLN: 30.0000 mL | ORAL | Status: DC | PRN

## 2017-05-03 MED ORDER — LIDOCAINE HCL (PF) 1 % IJ SOLN
30.0000 mL | INTRAMUSCULAR | Status: DC | PRN
  Filled 2017-05-03: qty 30

## 2017-05-03 MED ORDER — TERBUTALINE SULFATE 1 MG/ML IJ SOLN
0.2500 mg | Freq: Once | INTRAMUSCULAR | Status: DC | PRN
  Filled 2017-05-03: qty 1

## 2017-05-03 MED ORDER — SIMETHICONE 80 MG PO CHEW
80.0000 mg | CHEWABLE_TABLET | ORAL | Status: DC | PRN
Start: 2017-05-03 — End: 2017-05-05
  Administered 2017-05-05: 80 mg via ORAL

## 2017-05-03 MED ORDER — LACTATED RINGERS IV SOLN
500.0000 mL | Freq: Once | INTRAVENOUS | Status: AC
  Administered 2017-05-03: 500 mL via INTRAVENOUS

## 2017-05-03 MED ORDER — COCONUT OIL OIL: 1.0000 "application " | TOPICAL_OIL | Status: DC | PRN

## 2017-05-03 MED ORDER — SENNOSIDES-DOCUSATE SODIUM 8.6-50 MG PO TABS
2.0000 | ORAL_TABLET | ORAL | Status: DC
  Administered 2017-05-04 (×2): 2 via ORAL
  Filled 2017-05-03 (×2): qty 2

## 2017-05-03 MED ORDER — LACTATED RINGERS IV SOLN
INTRAVENOUS | Status: DC
  Administered 2017-05-03: 300 mL via INTRAUTERINE

## 2017-05-03 MED ORDER — PRENATAL MULTIVITAMIN CH
1.0000 | ORAL_TABLET | Freq: Every day | ORAL | Status: DC
  Administered 2017-05-04 – 2017-05-05 (×2): 1 via ORAL
  Filled 2017-05-03 (×2): qty 1

## 2017-05-03 MED ORDER — ACETAMINOPHEN 325 MG PO TABS: 650.0000 mg | ORAL_TABLET | ORAL | Status: DC | PRN

## 2017-05-03 MED ORDER — LACTATED RINGERS IV BOLUS (SEPSIS)
1000.0000 mL | Freq: Once | INTRAVENOUS | Status: AC
  Administered 2017-05-03: 1000 mL via INTRAVENOUS

## 2017-05-03 MED ORDER — LACTATED RINGERS IV SOLN
INTRAVENOUS | Status: DC
  Administered 2017-05-03: 02:00:00 via INTRAVENOUS

## 2017-05-03 MED ORDER — IBUPROFEN 600 MG PO TABS
600.0000 mg | ORAL_TABLET | Freq: Four times a day (QID) | ORAL | Status: DC
  Administered 2017-05-03 – 2017-05-05 (×9): 600 mg via ORAL
  Filled 2017-05-03 (×9): qty 1

## 2017-05-03 MED ORDER — DIPHENHYDRAMINE HCL 25 MG PO CAPS: 25.0000 mg | ORAL_CAPSULE | Freq: Four times a day (QID) | ORAL | Status: DC | PRN

## 2017-05-03 MED ORDER — OXYTOCIN 40 UNITS IN LACTATED RINGERS INFUSION - SIMPLE MED
2.5000 [IU]/h | INTRAVENOUS | Status: DC
  Administered 2017-05-03: 2.5 [IU]/h via INTRAVENOUS
  Filled 2017-05-03: qty 1000

## 2017-05-03 MED ORDER — DIPHENHYDRAMINE HCL 50 MG/ML IJ SOLN: 12.5000 mg | INTRAMUSCULAR | Status: DC | PRN

## 2017-05-03 NOTE — Progress Notes (Signed)
Sarah Mayo is a 27 y.o. G2P1001 at 8128w3d by ultrasound admitted for active labor  Subjective: Patient doing well. Can feel pressure of contractions with epidural but no sharp pain.   Objective: BP 120/69   Pulse (!) 101   Temp 98.7 F (37.1 C) (Oral)   Resp 20   Ht 5\' 9"  (1.753 m)   Wt 117.5 kg (259 lb)   LMP 07/20/2016 (Within Days)   SpO2 98%   BMI 38.25 kg/m  No intake/output data recorded. No intake/output data recorded.  FHT:  FHR: 150 bpm, variability: moderate,  accelerations:  Present,  decelerations:  Present variable UC:   regular, every 1-2 minutes SVE:   Dilation: 6.5 Effacement (%): 90 Station: -2 Exam by:: [employer]  Labs: Lab Results  Component Value Date   WBC 11.5 (H) 05/03/2017   HGB 11.5 (L) 05/03/2017   HCT 32.5 (L) 05/03/2017   MCV 75.9 (L) 05/03/2017   PLT 297 05/03/2017    Assessment / Plan: Spontaneous labor, progressing normally  Labor: Pitocin @8  miliunits/min, will continue to titrate per protocol  Fetal Wellbeing:  Category II Pain Control:  Epidural I/D:  n/a Anticipated MOD:  NSVD  Oralia ManisSherin Charitie Hinote, DO PGY-1 05/03/2017, 9:19 AM

## 2017-05-03 NOTE — Anesthesia Pain Management Evaluation Note (Signed)
  CRNA Pain Management Visit Note  Patient: Sarah GavelJasmin D Thoen, 27 y.o., female  "Hello I am a member of the anesthesia team at [facility]. We have an anesthesia team available at all times to provide care throughout the hospital, including epidural management and anesthesia for C-section. I don't know your plan for the delivery whether it a natural birth, water birth, IV sedation, nitrous supplementation, doula or epidural, but we want to meet your pain goals."   1.Was your pain managed to your expectations on prior hospitalizations?   Yes   2.What is your expectation for pain management during this hospitalization?     Epidural  3.How can we help you reach that goal? unsure  Record the patient's initial score and the patient's pain goal.   Pain: 0  Pain Goal: 8 [facility] wants you to be able to say your pain was always managed very well.  Cephus ShellingBURGER,Nafis Farnan 05/03/2017

## 2017-05-03 NOTE — H&P (Signed)
OBSTETRIC ADMISSION HISTORY AND PHYSICAL  Sarah Mayo is a 27 y.o. female G2P1001 with IUP at 15w3dby [place] for labor. She reports +FMs, No LOF, no VB, no blurry vision, headaches or peripheral edema, and RUQ pain.  She plans on breast feeding. She is undecided as to which method she will prefer for birth control. [facility]  Dating: By [place]--->  Estimated Date of Delivery: 04/30/17  Sono:    '@[redacted]w[redacted]d'$ , CWD, BL pyelectasis-otherwise normal, cephalic presentation, posterior placental lie above cervix, 3236g, 75% EFW, AC >97%   Prenatal History/Complications: SS trait [id number] Fetal pyelectasis  Past Medical History: Past Medical History:  Diagnosis Date  . Gestational diabetes   . Medical history non-contributory   . No pertinent past medical history   . Sickle cell trait ([facility]     Past Surgical History: Past Surgical History:  Procedure Laterality Date  . NO PAST SURGERIES      Obstetrical History: OB History    Gravida Para Term Preterm AB Living   '2 1 1     1   '$ SAB TAB Ectopic Multiple Live Births         0 1      Social History: Social History   Socioeconomic History  . Marital status: Single    Spouse name: None  . Number of children: None  . Years of education: None  . Highest education level: None  Social Needs  . Financial resource strain: None  . Food insecurity - worry: None  . Food insecurity - inability: None  . Transportation needs - medical: None  . Transportation needs - non-medical: None  Occupational History  . None  Tobacco Use  . Smoking status: Never Smoker  . Smokeless tobacco: Never Used  Substance and Sexual Activity  . Alcohol use: No    Comment: Social, Last drink August 2018  . Drug use: No    Comment: quit with pregnancy  . Sexual activity: Yes    Birth control/protection: None  Other Topics Concern  . None  Social History Narrative  . None    Family History: Family  History  Problem Relation Age of Onset  . Asthma Brother   . Sickle cell trait Mother     Allergies: No Known Allergies  Medications Prior to Admission  Medication Sig Dispense Refill Last Dose  . Blood Glucose Monitoring Suppl (ACCU-CHEK GUIDE) w/Device KIT 1 Device by Does not apply route 4 (four) times daily. 1 kit 0 05/02/2017 at Unknown time  . glucose blood test strip Use as instructed 100 each 12 05/02/2017 at Unknown time  . Prenatal Vit-Mayo Fumarate-FA (PRENATAL MULTIVITAMIN) TABS tablet Take 1 tablet by mouth daily at 12 noon.   05/02/2017 at Unknown time  . ACCU-CHEK FASTCLIX LANCETS MISC 1 Container by Percutaneous route 4 (four) times daily. Test BS AM fasting, and 2 hours after every meal. 102 each 12 Taking  . Elastic Bandages & Supports (COMFORT FIT MATERNITY SUPP SM) MISC Wear as directed. 1 each 0 Taking     Review of Systems   All systems reviewed and negative except as stated in HPI  Blood pressure (!) 128/55, pulse (!) 102, temperature 98.3 F (36.8 C), temperature source Oral, resp. rate 16, height '5\' 9"'$  (1.753 m), weight 117.5 kg (259 lb), last menstrual period 07/20/2016, unknown if currently breastfeeding. General appearance: alert, cooperative, appears stated age and no distress Lungs: clear to auscultation bilaterally Heart:  regular rate and rhythm, S1/S2 distinct, no S3/S4, no MRG Abdomen: soft, non-tender; bowel sounds normal Extremities: Homans sign is negative, no sign of DVT Presentation: cephalic Fetal monitoringBaseline: 150 bpm, Variability: Good {> 6 bpm), Accelerations: Non-reactive but appropriate for gestational age and Decelerations: Early, Variable Uterine activityFrequency: 5-6 min Dilation: 4.5 Effacement (%): 70 Station: -3 Exam by:: Iven Finn   Prenatal labs: ABO, Rh: A/Positive/-- (09/26 1340) Antibody: Negative (09/26 1340) Rubella: 2.82 (09/26 1340) RPR: Non Reactive (11/20 1300)  HBsAg: Negative (09/26 1340)  HIV: Non Reactive  (11/20 1300)  GBS: Negative (01/10 1446)  1 hr Glucola: 158 Genetic screening  negative Anatomy US: BL pyelectasis, otherwise normal  Prenatal Transfer Tool  Maternal Diabetes: Yes:  Diabetes Type:  Diet controlled Genetic Screening: Abnormal:  Results: Other: BL pyelectasis Maternal Ultrasounds/Referrals: Abnormal:  Findings:   Fetal Kidney Anomalies Fetal Ultrasounds or other Referrals:  None Maternal Substance Abuse:  No Significant Maternal Medications:  None Significant Maternal Lab Results: None  No results found for this or any previous visit (from the past 24 hour(s)).  Patient Active Problem List   Diagnosis Date Noted  . Abdominal pain in pregnancy, third trimester 03/29/2017  . Obesity affecting pregnancy, antepartum, third trimester 01/12/2017  . Fetal abnormality during pregnancy, antepartum 01/12/2017  . Encounter for repeat ultrasound of fetal pyelectasis in singleton pregnancy, antepartum 12/16/2016  . Supervision of other normal pregnancy, antepartum 12/15/2016  . BMI 32.0-32.9,adult 07/23/2014  . Sickle cell trait ([place]) 07/23/2014  . Hemoglobin C trait ([place]) 07/23/2014  . History of marijuana use 07/23/2014    Assessment/Plan:  Sarah Mayo is a 27 y.o. G2P1001 at 72w3dhere for IOL for gestational DM  #Labor: early #Pain: Per pt request, IV pain medication or Epidural #FWB: Category 2 #ID: GBS neg #MOF: breast #MOC: undecided  Sarah Mayo 05/03/2017, 1:18 AM  Midwife attestation: I have seen and examined this patient; I agree with above documentation in the student's note.   Sarah JASEKis a 27y.o. G2P1001 here for labor  PE: Gen: calm comfortable, NAD Resp: normal effort, no distress Abd: gravid  ROS, labs, PMH reviewed  Assessment/Plan: Admit to LD Labor: early  FWB: Cat II ID: GBS neg  Sarah Mayo  05/03/2017, 2:46 AM

## 2017-05-03 NOTE — Anesthesia Postprocedure Evaluation (Signed)
Anesthesia Post Note  Patient: Sarah GavelJasmin D Rogacki  Procedure(s) Performed: AN AD HOC LABOR EPIDURAL     Patient location during evaluation: Mother Baby Anesthesia Type: Epidural Level of consciousness: awake, awake and alert and oriented Pain management: pain level controlled Vital Signs Assessment: post-procedure vital signs reviewed and stable Respiratory status: spontaneous breathing, nonlabored ventilation and respiratory function stable Cardiovascular status: stable Postop Assessment: no headache, no apparent nausea or vomiting, adequate PO intake, patient able to bend at knees and no backache Anesthetic complications: no    Last Vitals:  Vitals:   05/03/17 1435 05/03/17 1535  BP: 126/60 140/80  Pulse: 87 (!) 102  Resp: 18 16  Temp: 37.2 C 37.6 C  SpO2: 99% 99%    Last Pain:  Vitals:   05/03/17 1535  TempSrc: Oral  PainSc: 0-No pain   Pain Goal:                 Irie Dowson

## 2017-05-03 NOTE — Progress Notes (Signed)
Patient ID: Sarah Mayo, female   DOB: 11/14/1990, 27 y.o.   MRN: [id number]  Patient having continued variable decelerations despite positional changes  Will discontinue pitocin If no improvement will likely place IUPC and start amnioinfusion   Plan discussed with Philipp DeputyKim Shaw, CNM  Oralia ManisSherin Hanna Aultman, DO, PGY-1 05/03/2017 9:37 AM

## 2017-05-03 NOTE — Progress Notes (Signed)
Sarah Mayo is a 27 y.o. G2P1001 at 7697w3d by ultrasound admitted for active labor  Subjective: Patient very tearful. States she is feeling lots of pressure   Objective: BP 110/60 (BP Location: Left Arm)   Pulse 91   Temp 98.5 F (36.9 C) (Oral)   Resp 16   Ht 5\' 9"  (1.753 m)   Wt 117.5 kg (259 lb)   LMP 07/20/2016 (Within Days)   SpO2 98%   BMI 38.25 kg/m  No intake/output data recorded. No intake/output data recorded.  FHT:  FHR: 150 bpm, variability: moderate,  accelerations:  Present,  decelerations:  Present variable UC:   regular, every 1-2 minutes SVE:   Dilation: 8 Effacement (%): 90 Station: -2 Exam by:: Dr Darin EngelsAbraham  Labs: Lab Results  Component Value Date   WBC 11.5 (H) 05/03/2017   HGB 11.5 (L) 05/03/2017   HCT 32.5 (L) 05/03/2017   MCV 75.9 (L) 05/03/2017   PLT 297 05/03/2017    Assessment / Plan: Spontaneous labor, progressing normally  Labor: Progressing without Pitocin. Will begin amnioinfusion. IUPC and FSC placed earlier Fetal Wellbeing:  Category II Pain Control:  Epidural I/D:  n/a Anticipated MOD:  NSVD  Sarah ManisSherin Neomia Herbel, DO PGY-1 05/03/2017, 11:37 AM

## 2017-05-03 NOTE — Progress Notes (Signed)
Labor Progress Note Sarah Mayo is a 27 y.o. G2P1001 at 6213w3d presented for labor  S:  Comfortable with epidural.  O:  BP (!) 120/57   Pulse (!) 108   Temp 98.3 F (36.8 C) (Oral)   Resp 16   Ht 5\' 9"  (1.753 m)   Wt 259 lb (117.5 kg)   LMP 07/20/2016 (Within Days)   SpO2 98%   BMI 38.25 kg/m  EFM: baseline 145 bpm/ mod variability/ + accels/ no decels  Toco: 2-7 SVE: Dilation: 5 Effacement (%): 70 Cervical Position: Posterior Station: -3 Presentation: Vertex(confirmed vertex by bedside ultrasound by M. Kamea Dacosta CNM ) Exam by:: Donette LarryMelanie Goddess Gebbia CNM    A/P: 27 y.o. G2P1001 10813w3d  1. Labor: protracted 2. FWB: Cat I 3. Pain: epidural  Start Pitocin augmentation. Vtx confirmed by US. Anticipate labor progression and SVD.  Donette LarryMelanie Turon Kilmer, CNM 7:15 AM

## 2017-05-03 NOTE — Anesthesia Procedure Notes (Signed)
Epidural Patient location during procedure: OB Start time: 05/03/2017 5:30 AM  Staffing Anesthesiologist: Mal AmabileFoster, Kaleena Corrow, MD Performed: anesthesiologist   Preanesthetic Checklist Completed: patient identified, site marked, surgical consent, pre-op evaluation, timeout performed, IV checked, risks and benefits discussed and monitors and equipment checked  Epidural Patient position: sitting Prep: site prepped and draped and DuraPrep Patient monitoring: continuous pulse ox and blood pressure Approach: midline Location: L3-L4 Injection technique: LOR air  Needle:  Needle type: Tuohy  Needle gauge: 17 G Needle length: 9 cm and 9 Needle insertion depth: 6 cm Catheter type: closed end flexible Catheter size: 19 Gauge Catheter at skin depth: 11 cm Test dose: negative and Other  Assessment Events: blood not aspirated, injection not painful, no injection resistance, negative IV test and no paresthesia  Additional Notes Patient identified. Risks and benefits discussed including failed block, incomplete  Pain control, post dural puncture headache, nerve damage, paralysis, blood pressure Changes, nausea, vomiting, reactions to medications-both toxic and allergic and post Partum back pain. All questions were answered. Patient expressed understanding and wished to proceed. Sterile technique was used throughout procedure. Epidural site was Dressed with sterile barrier dressing. No paresthesias, signs of intravascular injection Or signs of intrathecal spread were encountered.  Patient was more comfortable after the epidural was dosed. Please see RN's note for documentation of vital signs and FHR which are stable.

## 2017-05-03 NOTE — Anesthesia Preprocedure Evaluation (Signed)
Anesthesia Evaluation  Patient identified by MRN, date of birth, ID band Patient awake    Reviewed: Allergy & Precautions, NPO status , Patient's Chart, lab work & pertinent test results  Airway Mallampati: II  TM Distance: >3 FB Neck ROM: Full    Dental no notable dental hx. (+) Teeth Intact   Pulmonary neg pulmonary ROS,    Pulmonary exam normal breath sounds clear to auscultation       Cardiovascular negative cardio ROS Normal cardiovascular exam Rhythm:Regular Rate:Normal     Neuro/Psych negative neurological ROS  negative psych ROS   GI/Hepatic negative GI ROS, Neg liver ROS, (+)     substance abuse  marijuana use,   Endo/Other  diabetes, Well Controlled, GestationalObesity  Renal/GU negative Renal ROS  negative genitourinary   Musculoskeletal negative musculoskeletal ROS (+)   Abdominal (+) + obese,   Peds  Hematology  (+) Sickle cell trait and anemia , HbC trait   Anesthesia Other Findings   Reproductive/Obstetrics (+) Pregnancy                             Anesthesia Physical Anesthesia Plan  ASA: II  Anesthesia Plan: Epidural   Post-op Pain Management:    Induction:   PONV Risk Score and Plan:   Airway Management Planned: Natural Airway  Additional Equipment:   Intra-op Plan:   Post-operative Plan:   Informed Consent: I have reviewed the patients History and Physical, chart, labs and discussed the procedure including the risks, benefits and alternatives for the proposed anesthesia with the patient or authorized representative who has indicated his/her understanding and acceptance.     Plan Discussed with: Anesthesiologist  Anesthesia Plan Comments:         Anesthesia Quick Evaluation

## 2017-05-04 LAB — CBC
HEMATOCRIT: 26.4 % — AB (ref 36.0–46.0)
Hemoglobin: 9.2 g/dL — ABNORMAL LOW (ref 12.0–15.0)
MCH: 26.6 pg (ref 26.0–34.0)
MCHC: 34.8 g/dL (ref 30.0–36.0)
MCV: 76.3 fL — ABNORMAL LOW (ref 78.0–100.0)
Platelets: 248 10*3/uL (ref 150–400)
RBC: 3.46 MIL/uL — AB (ref 3.87–5.11)
RDW: 15.5 % (ref 11.5–15.5)
WBC: 13.9 10*3/uL — ABNORMAL HIGH (ref 4.0–10.5)

## 2017-05-04 NOTE — Progress Notes (Signed)
CSW acknowledges consult and completed chart review.  Consult is screened out; late PNC for CSW consults are 28 weeks or highter and their is not evidence of substance use during this pregnancy.    Please contact the Clinical Social Worker if needs arise, by MOB request, or if MOB scores greater than 9/yes to question 10 on Edinburgh Postpartum Depression Screen.  Vittoria Noreen Boyd-Gilyard, MSW, LCSW Clinical Social Work [phone]  

## 2017-05-04 NOTE — Plan of Care (Signed)
Patient progressing well. Pain is controlled, patient is ambulating, voiding, and passing gas.

## 2017-05-04 NOTE — Progress Notes (Addendum)
Post Partum Day 1 Subjective: no complaints, up ad lib, tolerating PO and + flatus  Objective: Blood pressure 113/67, pulse 87, temperature 98.2 F (36.8 C), temperature source Oral, resp. rate 18, height 5\' 9"  (1.753 m), weight 113.9 kg (251 lb 3.2 oz), last menstrual period 07/20/2016, SpO2 99 %, unknown if currently breastfeeding.  Physical Exam:  General: alert, cooperative and no distress Lochia: appropriate Uterine Fundus: firm DVT Evaluation: No evidence of DVT seen on physical exam. No cords or calf tenderness. No significant calf/ankle edema.  Recent Labs    05/03/17 0120 05/04/17 0520  HGB 11.5* 9.2*  HCT 32.5* 26.4*    Assessment/Plan: Plan for discharge tomorrow and Contraception depo. Both breast and bottle feeding   LOS: 1 day   Sarah ManisSherin Abraham, DO PGY-1 05/04/2017, 9:07 AM   I spoke with and examined patient and agree with resident/PA/SNM's note and plan of care.  Sarah Mayo, CNM, [facility] 05/05/2017 9:32 AM

## 2017-05-04 NOTE — Lactation Note (Signed)
This note was copied from a baby's chart. Lactation Consultation Note  Patient Name: Sarah Mayo QMVHQ'IToday's Date: 05/04/2017 Reason for consult: Initial assessment;Term Breastfeeding consultation services and support information given to patient.  Mom desires to breast and formula feed.  Symphony pump initiated this AM but no milk obtained.  Instructed to feed with cues and call for assist prn.  Instructed to pump every 2-3 hours for adequate breast stimulation.  Discussed milk coming to volume.  Maternal Data Has patient been taught Hand Expression?: Yes Does the patient have breastfeeding experience prior to this delivery?: No  Feeding    LATCH Score                   Interventions    Lactation Tools Discussed/Used Pump Review: Setup, frequency, and cleaning;Milk Storage Initiated by:: Dolly RiasKim Isley    Consult Status Consult Status: Follow-up Date: 05/05/17 Follow-up type: In-patient    Huston FoleyMOULDEN, [name] 05/04/2017, 1:40 PM

## 2017-05-05 ENCOUNTER — Other Ambulatory Visit: Payer: Self-pay

## 2017-05-05 ENCOUNTER — Encounter: Payer: Medicaid Other | Admitting: Obstetrics

## 2017-05-05 MED ORDER — FERROUS SULFATE 325 (65 FE) MG PO TABS: 325.0000 mg | ORAL_TABLET | Freq: Every day | ORAL | 0 refills | Status: DC

## 2017-05-05 MED ORDER — IBUPROFEN 600 MG PO TABS: 600.0000 mg | ORAL_TABLET | Freq: Four times a day (QID) | ORAL | 0 refills | Status: DC

## 2017-05-05 NOTE — Discharge Summary (Signed)
OB Discharge Summary     Patient Name: Sarah Mayo DOB: 1990-04-27 MRN: [id number] Date of admission: 05/03/2017  Delivering MD: Caroline More )  Date of discharge: 05/05/2017    Admitting diagnosis: labor Intrauterine pregnancy: [redacted]w[redacted]d   Secondary diagnosis:  Active Problems:   Patient Active Problem List   Diagnosis Date Noted  . Indication for care in labor or delivery 05/03/2017  . SVD (spontaneous vaginal delivery) 05/03/2017  . Abdominal pain in pregnancy, third trimester 03/29/2017  . Obesity affecting pregnancy, antepartum, third trimester 01/12/2017  . Fetal abnormality during pregnancy, antepartum 01/12/2017  . Encounter for repeat ultrasound of fetal pyelectasis in singleton pregnancy, antepartum 12/16/2016  . Supervision of other normal pregnancy, antepartum 12/15/2016  . BMI 32.0-32.9,adult 07/23/2014  . Sickle cell trait ([place] 07/23/2014  . Hemoglobin C trait ([place] 07/23/2014  . History of marijuana use 07/23/2014    Additional problems: A1GDM     Discharge diagnosis: Term Pregnancy Delivered                                                                                                Post partum procedures:none  Complications: None  Hospital course:  Onset of Labor With Vaginal Delivery     27y.o. yo G2P1001 at 440w5das admitted in Active Labor on 05/03/2017. Patient had an uncomplicated labor course as follows:  Membrane Rupture Time/Date: 8:10 AM ,05/03/2017   Intrapartum Procedures: Episiotomy: None [1]                                         Lacerations:  None [1]  Patient had a delivery of a Viable infant. 05/03/2017  Information for the patient's newborn:  PaKea, Callan0[[id number]]Delivery Method: Vag-Spont    Pateint had an uncomplicated postpartum course.  Discharge home with iron due to hgb 9.3. She is ambulating, tolerating a regular diet, passing flatus, and urinating well. Patient is discharged home in stable condition on  05/05/17.   Physical exam  Vitals:   05/04/17 1657 05/05/17 0636  BP: 121/60   Pulse: 92 (!) 103  Resp: 18 18  Temp: (!) 97.3 F (36.3 C) 97.9 F (36.6 C)  SpO2:  100%    General: alert, cooperative and no distress Lochia: appropriate Uterine Fundus: firm Incision: N/A DVT Evaluation: No evidence of DVT seen on physical exam.  Labs: No results found for this or any previous visit (from the past 24 hour(s)).   Discharge instruction: per After Visit Summary and "Baby and Me Booklet".  After visit meds:  No Known Allergies  Allergies as of 05/05/2017   No Known Allergies     Medication List    STOP taking these medications   ACCU-CHEK FASTCLIX LANCETS Misc   ACCU-CHEK GUIDE w/Device Kit   COMFORT FIT MATERNITY SUPP SM Misc   glucose blood test strip     TAKE these medications   ferrous sulfate 325 (65 FE) MG tablet Take 1 tablet (325 mg total) by  mouth daily with breakfast.   ibuprofen 600 MG tablet Commonly known as:  ADVIL,MOTRIN Take 1 tablet (600 mg total) by mouth every 6 (six) hours.   prenatal multivitamin Tabs tablet Take 1 tablet by mouth daily at 12 noon.        Diet: routine diet  Activity: Advance as tolerated. Pelvic rest for 6 weeks.   Outpatient follow up:6 weeks for GTT Future Appointments:  Future Appointments  Date Time Provider [place]  05/05/2017 11:15 AM Shelly Bombard, MD [place] None    Follow up Appt: No Follow-up on file.     Postpartum contraception: Depo Provera  Newborn Data: APGAR (1 MIN): 8   APGAR (5 MINS): 9     Baby Feeding: Breast Disposition:home with mother  Dannielle Huh, DO  05/05/2017

## 2017-05-06 ENCOUNTER — Ambulatory Visit: Payer: Self-pay

## 2017-05-06 NOTE — Lactation Note (Signed)
This note was copied from a baby's chart. Lactation Consultation Note  Patient Name: Sarah Mayo ZOXWR'UToday's Date: 05/06/2017 Reason for consult: Follow-up assessment;Term  871 hours old female who is now being exclusively formula fed by her mother. Mom has decided to formula feed and wishes no breastfeeding help at this point. Only thing reviewed was engorgement prevention and treatment. Mom is aware of LC services and will contact if needed.  Feeding Feeding Type: Bottle Fed - Formula Nipple Type: Slow - flow   Interventions Interventions: Breast feeding basics reviewed  Lactation Tools Discussed/Used     Consult Status Consult Status: Complete    [name] S Arnol Mcgibbon 05/06/2017, 11:48 AM

## 2017-05-07 ENCOUNTER — Inpatient Hospital Stay (HOSPITAL_COMMUNITY): Payer: Medicaid Other

## 2017-05-10 ENCOUNTER — Telehealth: Payer: Self-pay

## 2017-05-10 NOTE — Telephone Encounter (Signed)
Sarah Mayo from [facility] called stating that pt is having back pain from her epidural. She is taking the ibuprofen and alternating with tylenol and is not getting any relief. Please advise

## 2017-05-11 NOTE — Telephone Encounter (Signed)
Advise warm heating pad to back along with alternating Tylenol and Ibuprofen. [facility]

## 2017-05-11 NOTE — Telephone Encounter (Signed)
Pt informed

## 2017-06-06 ENCOUNTER — Encounter: Payer: Self-pay | Admitting: Obstetrics & Gynecology

## 2017-06-06 ENCOUNTER — Telehealth: Payer: Self-pay

## 2017-06-06 ENCOUNTER — Ambulatory Visit (INDEPENDENT_AMBULATORY_CARE_PROVIDER_SITE_OTHER): Payer: Medicaid Other | Admitting: Obstetrics & Gynecology

## 2017-06-06 VITALS — BP 116/80 | HR 91 | Wt 232.3 lb

## 2017-06-06 DIAGNOSIS — F53 Postpartum depression: Secondary | ICD-10-CM

## 2017-06-06 DIAGNOSIS — O99345 Other mental disorders complicating the puerperium: Principal | ICD-10-CM

## 2017-06-06 DIAGNOSIS — Z30011 Encounter for initial prescription of contraceptive pills: Secondary | ICD-10-CM

## 2017-06-06 DIAGNOSIS — Z1389 Encounter for screening for other disorder: Secondary | ICD-10-CM | POA: Diagnosis not present

## 2017-06-06 DIAGNOSIS — Z6834 Body mass index (BMI) 34.0-34.9, adult: Secondary | ICD-10-CM

## 2017-06-06 MED ORDER — DROSPIRENONE-ETHINYL ESTRADIOL 3-0.03 MG PO TABS: 1.0000 | ORAL_TABLET | Freq: Every day | ORAL | 11 refills | Status: DC

## 2017-06-06 NOTE — Telephone Encounter (Signed)
Return call to patient regarding message for concerns for post partum depression. Pt advised to come in office for an appt.  appt scheduled.

## 2017-06-06 NOTE — Progress Notes (Signed)
Post Partum Exam  Sarah Mayo is a 27 y.o. 32P1001 female who presents for a postpartum visit. She is 4 weeks postpartum following a spontaneous vaginal delivery. I have fully reviewed the prenatal and intrapartum course. The delivery was at 4460w3d gestational weeks.  Anesthesia: epidural. Postpartum course has been unremarkable. Baby's course has been unremarkable. Baby is feeding by bottle - Sarah Mayo . Bleeding no bleeding. Bowel function is normal. Bladder function is normal. Patient is not sexually active. Contraception method is none. Postpartum depression screening: Positive  The following portions of the patient's history were reviewed and updated as appropriate: allergies, current medications, past family history, past medical history, past social history, past surgical history and problem list. Last pap smear done 11/2016 and was Normal  Review of Systems Pertinent items are noted in HPI.    Objective:  Blood pressure 116/80, pulse 91, weight 232 lb 4.8 oz (105.4 kg), last menstrual period 07/20/2016, unknown if currently breastfeeding.  General:  alert, cooperative and no distress   Breasts:           Abdomen: soft, non-tender; bowel sounds normal; no masses,  no organomegaly   Vulva:  not evaluated  Vagina: not evaluated                    Assessment:    4 week postpartum exam. Pap smear not done at today's visit.   Plan:   1. Contraception: OCP (estrogen/progesterone) 2. Referral to Integrated behavioral health 3. Follow up as needed.   Sarah Mayo, Sarah Mayo G, MD 06/06/2017

## 2017-06-07 ENCOUNTER — Ambulatory Visit (INDEPENDENT_AMBULATORY_CARE_PROVIDER_SITE_OTHER): Payer: Medicaid Other | Admitting: Clinical

## 2017-06-07 DIAGNOSIS — F4323 Adjustment disorder with mixed anxiety and depressed mood: Secondary | ICD-10-CM

## 2017-06-07 NOTE — BH Specialist Note (Signed)
Integrated Behavioral Health Initial Visit  MRN: [id number] Name: Sarah Mayo  Number of Integrated Behavioral Health Clinician visits:: 1/6 Session Start time: 1:17 Session End time: 2:19 Total time: 1 hour  Type of Service: Integrated Behavioral Health- Individual/Family Interpretor:No. Interpretor Name and Language: n/a   Warm Hand Off Completed.       SUBJECTIVE: Sarah Mayo is a 27 y.o. female accompanied by n/a Patient was referred by Dr Debroah Loop for postpartum depression. Patient reports the following symptoms/concerns: Pt states her primary concern today is feeling irritable, depressed, lack of sleep, fatigue, poor appetite, and lack of adequate support from FOB; pt open to learning self-coping strategies to help improve sleep and irritability.  Duration of problem: Postpartum; Severity of problem: severe  OBJECTIVE: Mood: Depressed and Irritable and Affect: Depressed Risk of harm to self or others: Suicidal ideation No plan to harm self or others Pt says she had thoughts of taking pills about two months ago, but the thought scared her, and she made the decision that she would not want to hurt herself, as she wants to take care of her children, and see them grow up.   LIFE CONTEXT: Family and Social: Pt lives with FOB, almost 25yo; newborn. Pt's mother is supportive, but works two jobs School/Work: FOB going back to work soon Self-Care: Recognizing a greater need for self-care Life Changes: Recent childbirth   GOALS ADDRESSED: Patient will: 1. Reduce symptoms of: agitation and depression 2. Increase knowledge and/or ability of: self-management skills  3. Demonstrate ability to: Increase healthy adjustment to current life circumstances and Increase adequate support systems for patient/family  INTERVENTIONS: Interventions utilized: Mindfulness or Management consultant, Sleep Hygiene, Psychoeducation and/or Health Education and Link to [employer]   Standardized Assessments completed: GAD-7 and PHQ 2&9 with C-SSRS  ASSESSMENT: Patient currently experiencing Adjustment disorder with mixed anxiety and depressed mood   Patient may benefit from psychoeducation and brief therapeutic interventions regarding coping with symptoms of anxiety and depression .  PLAN: 1. Follow up with behavioral health clinician on : Two weeks, or as requested by patient 2. Behavioral recommendations:  -Follow Safety Plan -CALM relaxation breathing exercise twice daily(morning; evening) -Use sleep app at night for improved mother-baby sleep -Continue taking prenatal vitamins daily, as recommended by medical provider -Attend Mom Talk and Baby and Me classes at [facility] (Tues @ 10am, and Thurs @ 11am) -Consider counseling services at [facility] regarding coping with symptoms of anxiety and depression 3. Referral(s): Integrated Art gallery manager (In Clinic) and Community Mental Health Services (LME/Outside Clinic) 4. "From scale of 1-10, how likely are you to follow plan?": 9  Valetta Close Dover, LCSW  Edinburgh Postnatal Depression Scale - 06/06/17 1328      Edinburgh Postnatal Depression Scale:  In the Past 7 Days   I have been able to laugh and see the funny side of things.  1    I have looked forward with enjoyment to things.  2    I have blamed myself unnecessarily when things went wrong.  1    I have been anxious or worried for no good reason.  2    I have felt scared or panicky for no good reason.  2    Things have been getting on top of me.  3    I have been so unhappy that I have had difficulty sleeping.  2    I have felt sad or miserable.  3    I have been so unhappy that I have been crying.  2    The thought of harming myself has occurred to me.  1    Edinburgh Postnatal Depression Scale Total  19

## 2017-06-07 NOTE — Patient Instructions (Signed)
My Safety Plan:   Step 1: Warning signs (thoughts, images, mood, situation, behavior) that a crisis may be developing: If I feel overwhelmed  Step 2: Internal coping strategies: Things I can do to take my mind off my problems without contacting another person (music, relaxation technique, physical activity): Listen to music and go for a drive  Step 3: People I can ask for help:  Name, relationship, contact: My mom, [phone]  Step 5: Agencies I can contact during a crisis:      1. 9-1-1     2. [facility] (24/7 walk-in) [address], [place], [place]     3. [facility]: Intake- [phone]/ [phone]     4. Closest Emergency Room Address: [facility](see below)  Suicide Prevention Lifeline Phone: [phone]  Step 6: Making the environment safe- Have friend or family remove from home: My mom will do this for me * Weapons in the home * Medication in the home (including Tylenol)  Step 7: The one thing that is most important to me and worth living for is: My children  Signature of Patient: _________________________________________________  Signature of Provider: ________________________________________________  [facility] [address], [place], [place] [phone]  [facility] [address], [place], [place] [phone]  [facility] [place] [address], [place], [place] [phone]  [facility] [address] [phone]  [facility] [address], [place], [place] [phone]

## 2017-06-15 NOTE — BH Specialist Note (Deleted)
Integrated Behavioral Health Initial Visit  MRN: [id number] Name: Sarah Mayo Rusnak  Number of Integrated Behavioral Health Clinician visits:: 2/6 Session Start time: ***  Session End time: *** Total time: {IBH Total Time:21014050}  Type of Service: Integrated Behavioral Health- Individual/Family Interpretor:No. Interpretor Name and Language: n/a   Warm Hand Off Completed.       SUBJECTIVE: Sarah Mayo Culp is a 27 y.o. female accompanied by {CHL AMB ACCOMPANIED NW:[id number]}BY:[phone]} Patient was referred by Dr Debroah LoopArnold for depression postpartum. Patient reports the following symptoms/concerns: *** Duration of problem: ***; Severity of problem: {Mild/Moderate/Severe:20260}  OBJECTIVE: Mood: {BHH MOOD:22306} and Affect: {BHH AFFECT:22307} Risk of harm to self or others: {CHL AMB BH Suicide Current Mental Status:21022748}  LIFE CONTEXT: Family and Social: *** School/Work: *** Self-Care: *** Life Changes: ***  GOALS ADDRESSED: Patient will: 1. Reduce symptoms of: {IBH Symptoms:21014056} 2. Increase knowledge and/or ability of: {IBH Patient Tools:21014057}  3. Demonstrate ability to: {IBH Goals:21014053}  INTERVENTIONS: Interventions utilized: {IBH Interventions:21014054}  Standardized Assessments completed: {IBH Screening Tools:21014051}  ASSESSMENT: Patient currently experiencing ***.   Patient may benefit from ***.  PLAN: 1. Follow up with behavioral health clinician on : *** 2. Behavioral recommendations: *** 3. Referral(s): {IBH Referrals:21014055} 4. "From scale of 1-10, how likely are you to follow plan?": ***  Valetta CloseJamie C Alpa Salvo, LCSW   Depression screen [facility] 2/9 06/07/2017  Decreased Interest 2  Down, Depressed, Hopeless 3  PHQ - 2 Score 5  Altered sleeping 2  Tired, decreased energy 2  Change in appetite 2  Feeling bad or failure about yourself  2  Trouble concentrating 0  Moving slowly or fidgety/restless 0  Suicidal thoughts 1  PHQ-9 Score 14   GAD 7  : Generalized Anxiety Score 06/07/2017  Nervous, Anxious, on Edge 1  Control/stop worrying 2  Worry too much - different things 2  Trouble relaxing 2  Restless 2  Easily annoyed or irritable 3  Afraid - awful might happen 2  Total GAD 7 Score 14    Edinburgh Postnatal Depression Scale - 06/06/17 1328      Edinburgh Postnatal Depression Scale:  In the Past 7 Days   I have been able to laugh and see the funny side of things.  1    I have looked forward with enjoyment to things.  2    I have blamed myself unnecessarily when things went wrong.  1    I have been anxious or worried for no good reason.  2    I have felt scared or panicky for no good reason.  2    Things have been getting on top of me.  3    I have been so unhappy that I have had difficulty sleeping.  2    I have felt sad or miserable.  3    I have been so unhappy that I have been crying.  2    The thought of harming myself has occurred to me.  1    Edinburgh Postnatal Depression Scale Total  19

## 2017-06-16 ENCOUNTER — Ambulatory Visit: Payer: Medicaid Other | Admitting: Obstetrics and Gynecology

## 2017-06-16 ENCOUNTER — Institutional Professional Consult (permissible substitution): Payer: Self-pay

## 2017-06-16 ENCOUNTER — Other Ambulatory Visit: Payer: Medicaid Other

## 2017-06-24 ENCOUNTER — Ambulatory Visit: Payer: Medicaid Other | Admitting: Obstetrics and Gynecology

## 2017-06-24 ENCOUNTER — Other Ambulatory Visit: Payer: Medicaid Other

## 2017-06-24 ENCOUNTER — Encounter: Payer: Self-pay | Admitting: Obstetrics and Gynecology

## 2017-06-24 VITALS — BP 109/70 | HR 89 | Ht 69.0 in | Wt 233.0 lb

## 2017-06-24 DIAGNOSIS — O99815 Abnormal glucose complicating the puerperium: Secondary | ICD-10-CM

## 2017-06-24 NOTE — Progress Notes (Signed)
Presents for PP 2Hr. GTT. Have no problems today.

## 2017-06-25 LAB — GLUCOSE TOLERANCE, 2 HOURS
GLUCOSE FASTING GTT: 94 mg/dL (ref 65–99)
GLUCOSE, 2 HOUR: 108 mg/dL (ref 65–139)

## 2017-06-27 ENCOUNTER — Telehealth: Payer: Self-pay

## 2017-06-27 DIAGNOSIS — Z3483 Encounter for supervision of other normal pregnancy, third trimester: Secondary | ICD-10-CM

## 2017-06-27 NOTE — Telephone Encounter (Signed)
Patient notified of results.

## 2017-06-27 NOTE — Telephone Encounter (Signed)
-----   Message from Adam PhenixJames G Arnold, MD sent at 06/27/2017  8:27 AM EDT ----- Normal GTT

## 2017-06-29 NOTE — Progress Notes (Signed)
patient had PP on 06/06/17, visit scheduled in error. Only needed PP 2HR gtt to be completed.  Raelyn MoraRolitta Jammi Morrissette, CNM 06/24/2017

## 2017-09-15 ENCOUNTER — Encounter (HOSPITAL_COMMUNITY): Payer: Self-pay | Admitting: Emergency Medicine

## 2017-09-15 ENCOUNTER — Other Ambulatory Visit: Payer: Self-pay

## 2017-09-15 ENCOUNTER — Ambulatory Visit (HOSPITAL_COMMUNITY)
Admission: EM | Admit: 2017-09-15 | Discharge: 2017-09-15 | Disposition: A | Discharge status: Home / Self Care | Payer: Medicaid Other | Attending: Family Medicine | Admitting: Family Medicine

## 2017-09-15 DIAGNOSIS — J069 Acute upper respiratory infection, unspecified: Secondary | ICD-10-CM | POA: Diagnosis not present

## 2017-09-15 DIAGNOSIS — B9789 Other viral agents as the cause of diseases classified elsewhere: Secondary | ICD-10-CM | POA: Diagnosis not present

## 2017-09-15 MED ORDER — DM-GUAIFENESIN ER 30-600 MG PO TB12: 2.0000 | ORAL_TABLET | Freq: Two times a day (BID) | ORAL | 0 refills | Status: AC

## 2017-09-15 MED ORDER — IBUPROFEN 800 MG PO TABS: 800.0000 mg | ORAL_TABLET | Freq: Three times a day (TID) | ORAL | 0 refills | Status: DC

## 2017-09-15 MED ORDER — IPRATROPIUM BROMIDE 0.06 % NA SOLN: 2.0000 | Freq: Four times a day (QID) | NASAL | 12 refills | Status: DC

## 2017-09-15 NOTE — ED Provider Notes (Signed)
[facility]    CSN: [id number] Arrival date & time: 09/15/17  1001     History   Chief Complaint Chief Complaint  Patient presents with  . URI    HPI Sarah Mayo is a 27 y.o. female.   Sarah Mayo presents with complaints of chills, productive cough, decreased appetite, congestion, mild sore throat which started 2 days ago. No specific known fevers. No vomiting or diarrhea. No known ill contacts. Used a humidifier which helped some. Has been trying over the counter treatments such as daytime cough and mucus treatment which have minimally helped. Without contributing medical history.     ROS per HPI.      Past Medical History:  Diagnosis Date  . Gestational diabetes   . Medical history non-contributory   . No pertinent past medical history   . Sickle cell trait [facility])     Patient Active Problem List   Diagnosis Date Noted  . BMI 34.0-34.9,adult 07/23/2014  . Sickle cell trait (HCC) 07/23/2014  . 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 History of marijuana use 07/23/2014    Past Surgical History:  Procedure Laterality Date  . NO PAST SURGERIES      OB History    Gravida  2   Para  2   Term  2   Preterm      AB      Living  2     SAB      TAB      Ectopic      Multiple  0   Live Births  2            Home Medications    Prior to Admission medications   Medication Sig Start Date End Date Taking? Authorizing Provider  dextromethorphan-guaiFENesin (MUCINEX DM) 30-600 MG 12hr tablet Take 2 tablets by mouth 2 (two) times daily for 5 days. 09/15/17 09/20/17  Georgetta Haber, NP  drospirenone-ethinyl estradiol (YASMIN 28) 3-0.03 MG tablet Take 1 tablet by mouth daily. 06/06/17   Adam Phenix, MD  ferrous sulfate 325 (65 FE) MG tablet Take 1 tablet (325 mg total) by mouth daily with breakfast. 05/05/17   Moss, Amber, DO  ibuprofen (ADVIL,MOTRIN) 800 MG tablet Take 1 tablet (800 mg total) by mouth 3 (three) times daily. 09/15/17    Linus Mako B, NP  ipratropium (ATROVENT) 0.06 % nasal spray Place 2 sprays into both nostrils 4 (four) times daily. 09/15/17   Georgetta Haber, NP  Prenatal Vit-Fe Fumarate-FA (PRENATAL MULTIVITAMIN) TABS tablet Take 1 tablet by mouth daily at 12 noon.    [provider]    Family History Family History  Problem Relation Age of Onset  . Asthma Brother   . Sickle cell trait Mother     Social History Social History   Tobacco Use  . Smoking status: Never Smoker  . Smokeless tobacco: Never Used  Substance Use Topics  . Alcohol use: No    Comment: Social, Last drink August 2018  . Drug use: No    Types: Marijuana    Comment: quit with pregnancy     Allergies   Patient has no known allergies.   Review of Systems Review of Systems   Physical Exam Triage Vital Signs ED Triage Vitals  Enc Vitals Group     BP 09/15/17 1037 130/81     Pulse Rate 09/15/17 1026 97     Resp 09/15/17 1026 18     Temp 09/15/17 1026  99.2 F (37.3 C)     Temp Source 09/15/17 1026 Oral     SpO2 09/15/17 1026 97 %     Weight --      Height --      Head Circumference --      Peak Flow --      Pain Score 09/15/17 1023 0     Pain Loc --      Pain Edu? --      Excl. in GC? --    No data found.  Updated Vital Signs BP 130/81 (BP Location: Right Arm)   Pulse 97   Temp 99.2 F (37.3 C) (Oral)   Resp 18   LMP 08/15/2017   SpO2 97%    Physical Exam  Constitutional: She is oriented to person, place, and time. She appears well-developed and well-nourished. No distress.  HENT:  Head: Normocephalic and atraumatic.  Right Ear: Tympanic membrane, external ear and ear canal normal.  Left Ear: Tympanic membrane, external ear and ear canal normal.  Nose: Rhinorrhea present. Right sinus exhibits no maxillary sinus tenderness and no frontal sinus tenderness. Left sinus exhibits no maxillary sinus tenderness and no frontal sinus tenderness.  Mouth/Throat: Uvula is midline, oropharynx is  clear and moist and mucous membranes are normal. No tonsillar exudate.  Eyes: Pupils are equal, round, and reactive to light. Conjunctivae and EOM are normal.  Cardiovascular: Normal rate, regular rhythm and normal heart sounds.  Pulmonary/Chest: Effort normal and breath sounds normal.  Neurological: She is alert and oriented to person, place, and time.  Skin: Skin is warm and dry.     UC Treatments / Results  Labs (all labs ordered are listed, but only abnormal results are displayed) Labs Reviewed - No data to display  EKG None  Radiology No results found.  Procedures Procedures (including critical care time)  Medications Ordered in UC Medications - No data to display  Initial Impression / Assessment and Plan / UC Course  I have reviewed the triage vital signs and the nursing notes.  Pertinent labs & imaging results that were available during my care of the patient were reviewed by me and considered in my medical decision making (see chart for details).     Benign physical exam. Non toxic in appearance. No cough throughout exam. Afebrile, no tachypnea or hypoxia. History and physical consistent with viral illness.  Supportive cares recommended. Patient verbalized understanding and agreeable to plan.    Final Clinical Impressions(s) / UC Diagnoses   Final diagnoses:  Viral URI with cough     Discharge Instructions     Push fluids to ensure adequate hydration and keep secretions thin.  Tylenol and/or ibuprofen as needed for pain or fevers.  Use of nasal spray 2-4 times a day to help with secretions Mucinex d may help some with symptoms.  Continue with over the counter treatments, humidifier etc as needed for symptoms.  If symptoms worsen or do not improve in the next week to return to be seen or to follow up with your PCP.     ED Prescriptions    Medication Sig Dispense Auth. Provider   ipratropium (ATROVENT) 0.06 % nasal spray Place 2 sprays into both nostrils 4  (four) times daily. 15 mL Linus MakoBurky, Dayten Juba B, NP   dextromethorphan-guaiFENesin (MUCINEX DM) 30-600 MG 12hr tablet Take 2 tablets by mouth 2 (two) times daily for 5 days. 20 tablet Linus MakoBurky, Zakary Kimura B, NP   ibuprofen (ADVIL,MOTRIN) 800 MG tablet Take 1 tablet (  800 mg total) by mouth 3 (three) times daily. 21 tablet Georgetta Haber, NP     Controlled Substance Prescriptions [place] Controlled Substance Registry consulted? Not Applicable   Georgetta Haber, NP 09/15/17 1056

## 2017-09-15 NOTE — ED Triage Notes (Signed)
Poor appetite, no sense of taste or smell, stuffy nose, head and chest congestion, cold chills-onset 2 days ago.

## 2017-09-15 NOTE — Discharge Instructions (Signed)
Push fluids to ensure adequate hydration and keep secretions thin.  Tylenol and/or ibuprofen as needed for pain or fevers.  Use of nasal spray 2-4 times a day to help with secretions Mucinex d may help some with symptoms.  Continue with over the counter treatments, humidifier etc as needed for symptoms.  If symptoms worsen or do not improve in the next week to return to be seen or to follow up with your PCP.

## 2018-03-06 ENCOUNTER — Encounter (HOSPITAL_COMMUNITY): Payer: Self-pay | Admitting: Emergency Medicine

## 2018-03-06 ENCOUNTER — Ambulatory Visit (HOSPITAL_COMMUNITY)
Admission: EM | Admit: 2018-03-06 | Discharge: 2018-03-06 | Disposition: A | Discharge status: Home / Self Care | Payer: Medicaid Other | Attending: Family Medicine | Admitting: Family Medicine

## 2018-03-06 DIAGNOSIS — D573 Sickle-cell trait: Secondary | ICD-10-CM | POA: Insufficient documentation

## 2018-03-06 DIAGNOSIS — J069 Acute upper respiratory infection, unspecified: Secondary | ICD-10-CM

## 2018-03-06 DIAGNOSIS — J029 Acute pharyngitis, unspecified: Secondary | ICD-10-CM | POA: Diagnosis present

## 2018-03-06 LAB — POCT RAPID STREP A: Streptococcus, Group A Screen (Direct): NEGATIVE

## 2018-03-06 MED ORDER — FLUTICASONE PROPIONATE 50 MCG/ACT NA SUSP: 1.0000 | Freq: Every day | NASAL | 2 refills | Status: DC

## 2018-03-06 MED ORDER — BENZONATATE 100 MG PO CAPS: 100.0000 mg | ORAL_CAPSULE | Freq: Three times a day (TID) | ORAL | 0 refills | Status: DC

## 2018-03-06 MED ORDER — ALBUTEROL SULFATE HFA 108 (90 BASE) MCG/ACT IN AERS
2.0000 | INHALATION_SPRAY | RESPIRATORY_TRACT | Status: DC | PRN
  Administered 2018-03-06 (×2): 2 via RESPIRATORY_TRACT

## 2018-03-06 MED ORDER — ALBUTEROL SULFATE HFA 108 (90 BASE) MCG/ACT IN AERS
INHALATION_SPRAY | RESPIRATORY_TRACT | Status: AC
  Filled 2018-03-06: qty 6.7

## 2018-03-06 NOTE — Discharge Instructions (Signed)
Take tylenol and ibuprofen as needed for fever or discomfort. Follow up with your doctor or return here as needed.

## 2018-03-06 NOTE — ED Triage Notes (Signed)
Pt c/o body aches, nasal congestion, headaches, coughing, chest congestion, x2 days.

## 2018-03-06 NOTE — ED Provider Notes (Signed)
[facility]    CSN: [id number] Arrival date & time: 03/06/18  1002     History   Chief Complaint Chief Complaint  Patient presents with  . URI    HPI Sarah Mayo is a 27 y.o. female who present to the ED for flu like symptoms that started 2 days ago.   The history is provided by the patient. No language interpreter was used.  URI  Presenting symptoms: congestion, cough, fatigue, rhinorrhea and sore throat   Presenting symptoms: no ear pain, no facial pain and no fever   Duration:  2 days Progression:  Worsening Relieved by:  Nothing Worsened by:  Nothing Associated symptoms: headaches, myalgias and sneezing   Associated symptoms: no wheezing     Past Medical History:  Diagnosis Date  . Gestational diabetes   . Medical history non-contributory   . No pertinent past medical history   . Sickle cell trait [facility])     Patient Active Problem List   Diagnosis Date Noted  . BMI 34.0-34.9,adult 07/23/2014  . Sickle cell trait (HCC) 07/23/2014  . 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 History of marijuana use 07/23/2014    Past Surgical History:  Procedure Laterality Date  . NO PAST SURGERIES      OB History    Gravida  2   Para  2   Term  2   Preterm      AB      Living  2     SAB      TAB      Ectopic      Multiple  0   Live Births  2            Home Medications    Prior to Admission medications   Medication Sig Start Date End Date Taking? Authorizing Provider  benzonatate (TESSALON) 100 MG capsule Take 1 capsule (100 mg total) by mouth every 8 (eight) hours. 03/06/18   Janne Napoleon, NP  drospirenone-ethinyl estradiol (YASMIN 28) 3-0.03 MG tablet Take 1 tablet by mouth daily. 06/06/17   Adam Phenix, MD  ferrous sulfate 325 (65 FE) MG tablet Take 1 tablet (325 mg total) by mouth daily with breakfast. Patient not taking: Reported on 03/06/2018 05/05/17   Rachelle Hora, Amber, DO  fluticasone (FLONASE) 50 MCG/ACT nasal spray  Place 1 spray into both nostrils daily. 03/06/18   Janne Napoleon, NP  Prenatal Vit-Fe Fumarate-FA (PRENATAL MULTIVITAMIN) TABS tablet Take 1 tablet by mouth daily at 12 noon.    [provider]    Family History Family History  Problem Relation Age of Onset  . Asthma Brother   . Sickle cell trait Mother     Social History Social History   Tobacco Use  . Smoking status: Never Smoker  . Smokeless tobacco: Never Used  Substance Use Topics  . Alcohol use: No    Comment: Social, Last drink August 2018  . Drug use: No    Types: Marijuana    Comment: quit with pregnancy     Allergies   Patient has no known allergies.   Review of Systems Review of Systems  Constitutional: Positive for fatigue. Negative for fever.  HENT: Positive for congestion, rhinorrhea, sneezing and sore throat. Negative for ear pain.   Eyes: Negative for pain, discharge, redness, itching and visual disturbance.  Respiratory: Positive for cough. Negative for wheezing.   Cardiovascular: Negative for chest pain and palpitations.  Gastrointestinal: Negative for abdominal pain,  diarrhea, nausea and vomiting.  Genitourinary: Negative for dysuria and urgency.  Musculoskeletal: Positive for myalgias.  Skin: Negative for rash.  Neurological: Positive for headaches. Negative for syncope.  Psychiatric/Behavioral: Negative for confusion.     Physical Exam Triage Vital Signs ED Triage Vitals  Enc Vitals Group     BP 03/06/18 1056 132/71     Pulse Rate 03/06/18 1056 96     Resp 03/06/18 1056 16     Temp 03/06/18 1056 98.7 F (37.1 C)     Temp src --      SpO2 03/06/18 1056 99 %     Weight --      Height --      Head Circumference --      Peak Flow --      Pain Score 03/06/18 1058 7     Pain Loc --      Pain Edu? --      Excl. in GC? --    No data found.  Updated Vital Signs BP 132/71   Pulse 96   Temp 98.7 F (37.1 C)   Resp 16   LMP 03/02/2018   SpO2 99%   Visual Acuity Right Eye  Distance:   Left Eye Distance:   Bilateral Distance:    Right Eye Near:   Left Eye Near:    Bilateral Near:     Physical Exam Vitals signs and nursing note reviewed.  Constitutional:      General: She is not in acute distress.    Appearance: She is well-developed.  HENT:     Head: Normocephalic.     Right Ear: Tympanic membrane normal.     Left Ear: Tympanic membrane normal.     Nose: Congestion and rhinorrhea present.     Mouth/Throat:     Mouth: Mucous membranes are moist.     Pharynx: No oropharyngeal exudate or posterior oropharyngeal erythema.  Eyes:     Extraocular Movements: Extraocular movements intact.     Conjunctiva/sclera: Conjunctivae normal.     Pupils: Pupils are equal, round, and reactive to light.  Neck:     Musculoskeletal: Normal range of motion and neck supple.  Cardiovascular:     Rate and Rhythm: Normal rate and regular rhythm.  Pulmonary:     Effort: Pulmonary effort is normal. No respiratory distress.     Breath sounds: No wheezing or rales.  Musculoskeletal: Normal range of motion.  Lymphadenopathy:     Cervical: No cervical adenopathy.  Skin:    General: Skin is warm and dry.  Neurological:     Mental Status: She is alert and oriented to person, place, and time.  Psychiatric:        Mood and Affect: Mood normal.      UC Treatments / Results  Labs (all labs ordered are listed, but only abnormal results are displayed) Labs Reviewed  CULTURE, GROUP A STREP [facility])  POCT RAPID STREP A   Radiology No results found.  Procedures Procedures (including critical care time)  Medications Ordered in UC Medications - No data to display  Initial Impression / Assessment and Plan / UC Course  I have reviewed the triage vital signs and the nursing notes. Patients symptoms are consistent with URI, likely viral etiology. Discussed that antibiotics are not indicated for viral infections. Pt will be discharged with symptomatic treatment.  Verbalizes  understanding and is agreeable with plan. Pt is hemodynamically stable & in NAD prior to dc. Final Clinical Impressions(s) / UC  Diagnoses   Final diagnoses:  Acute upper respiratory infection     Discharge Instructions     Take tylenol and ibuprofen as needed for fever or discomfort. Follow up with your doctor or return here as needed.     ED Prescriptions    Medication Sig Dispense Auth. Provider   fluticasone (FLONASE) 50 MCG/ACT nasal spray Place 1 spray into both nostrils daily. 16 g Kerrie BuffaloNeese,  M, NP   benzonatate (TESSALON) 100 MG capsule Take 1 capsule (100 mg total) by mouth every 8 (eight) hours. 21 capsule Janne NapoleonNeese,  M, NP     Controlled Substance Prescriptions [place] Controlled Substance Registry consulted? Not Applicable   Janne Napoleoneese,  M, [place] 03/06/18 1220

## 2018-03-09 LAB — CULTURE, GROUP A STREP (THRC)

## 2018-04-14 IMAGING — US US MFM OB FOLLOW-UP
1 series · 14 of 28 positions shown · non-contrast
Comparison: none

[Series 1: us mfm ob follow-up · 67 acquisitions, 14 frames shown]
[im 3/67]
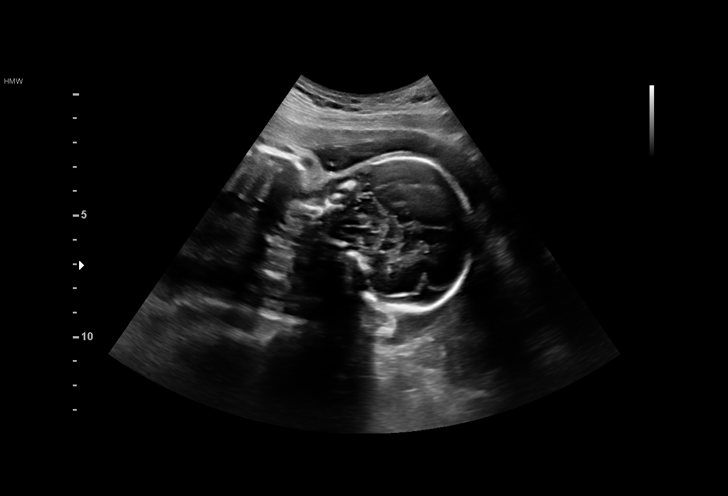
[im 8/67]
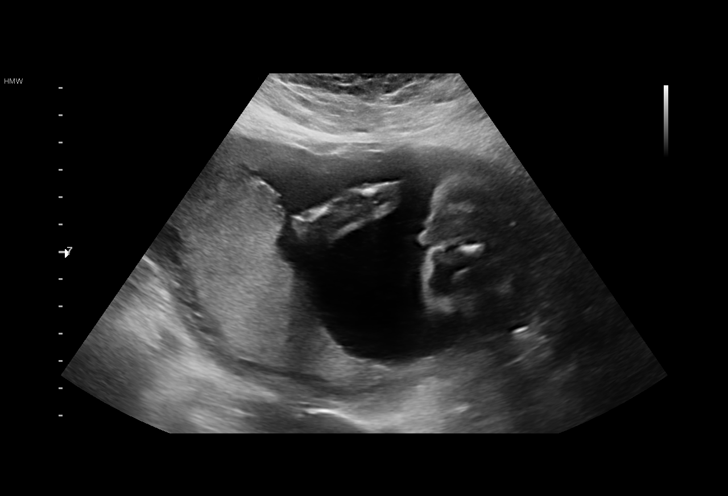
[im 13/67]
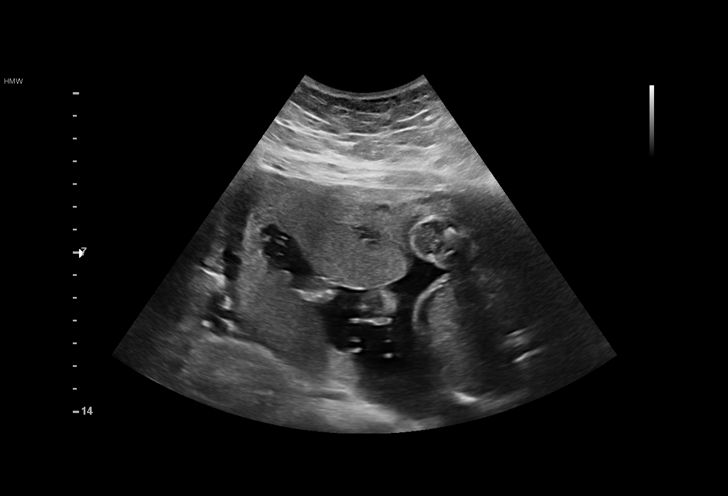
[im 18/67]
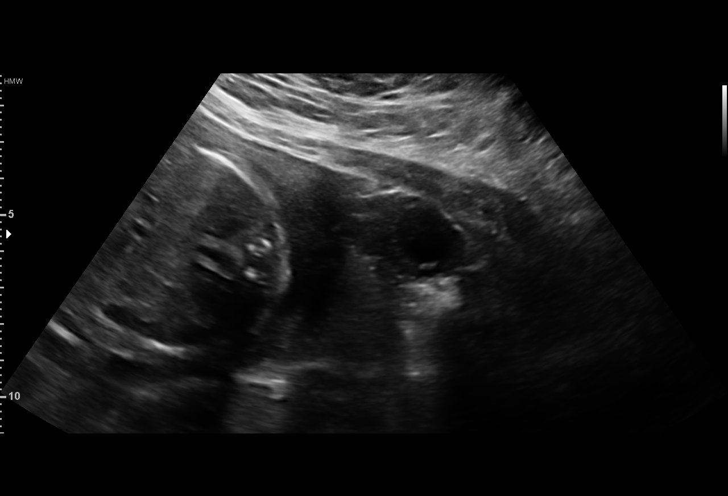
[im 23/67]
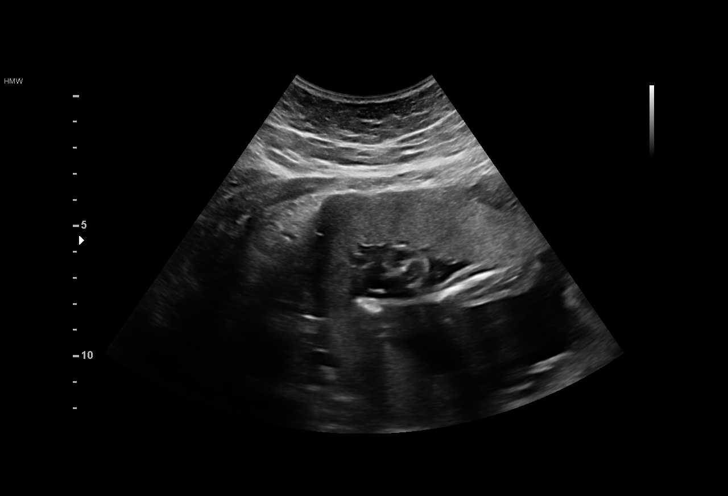
[im 27/67]
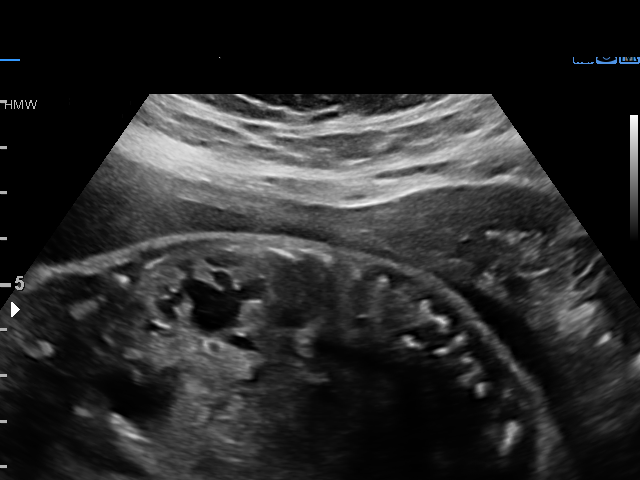
[im 32/67]
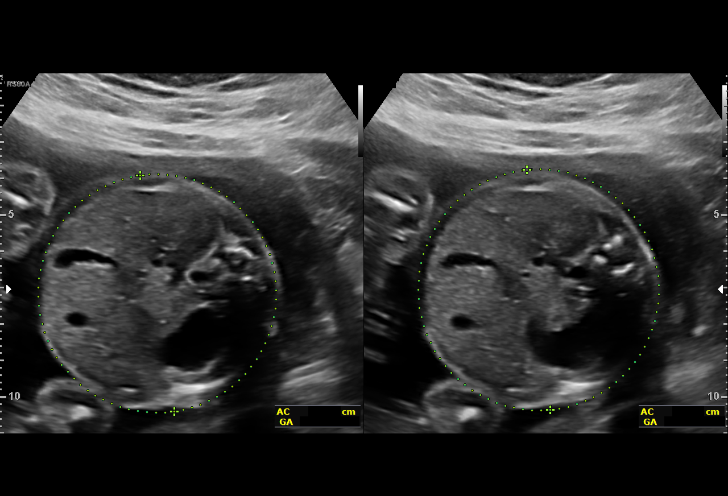
[im 37/67]
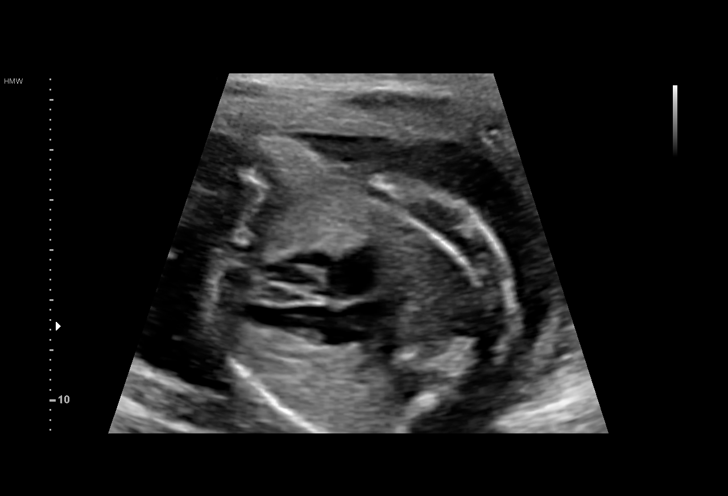
[im 42/67]
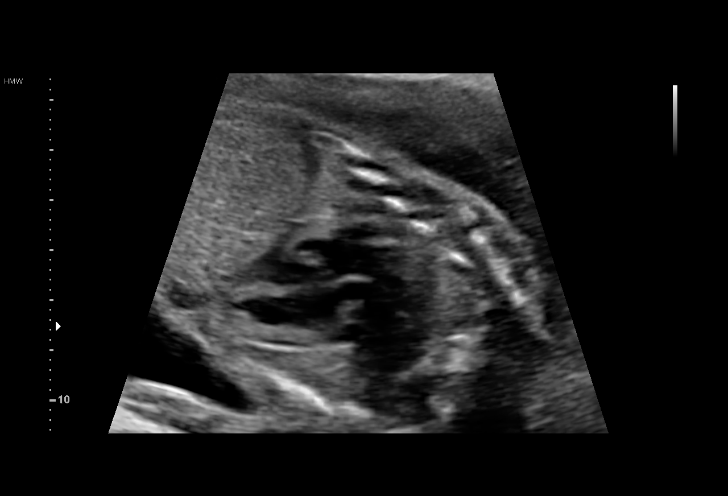
[im 47/67]
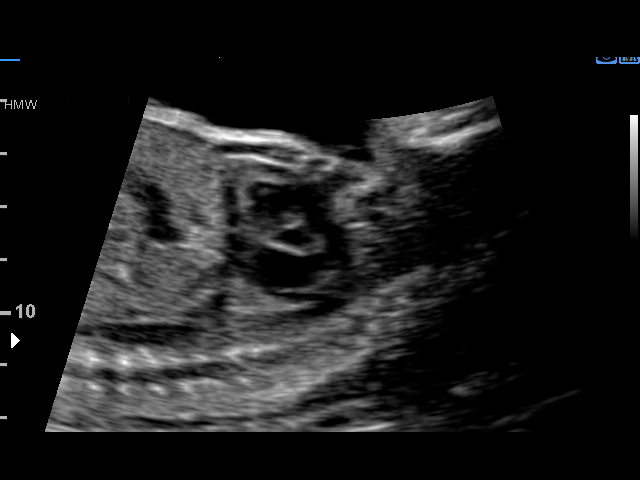
[im 52/67]
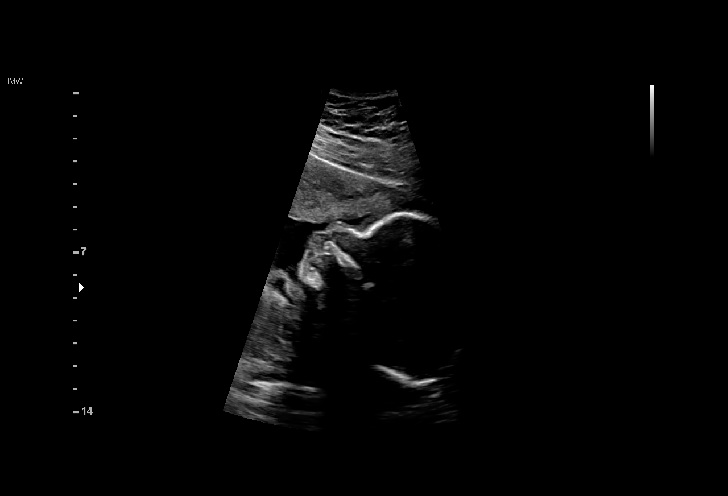
[im 57/67]
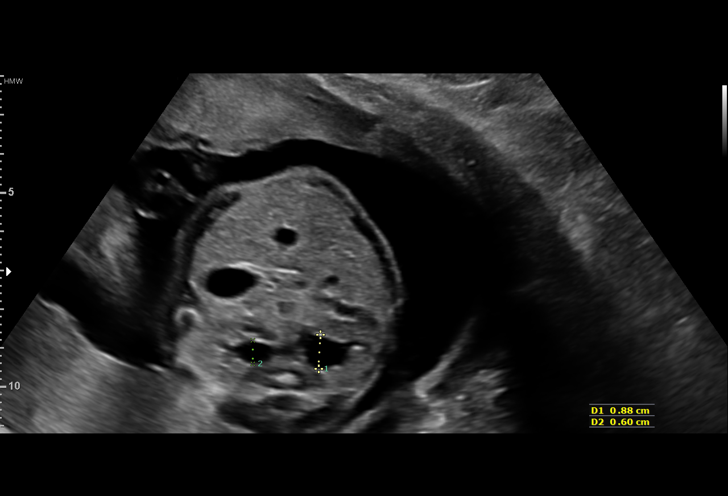
[im 62/67]
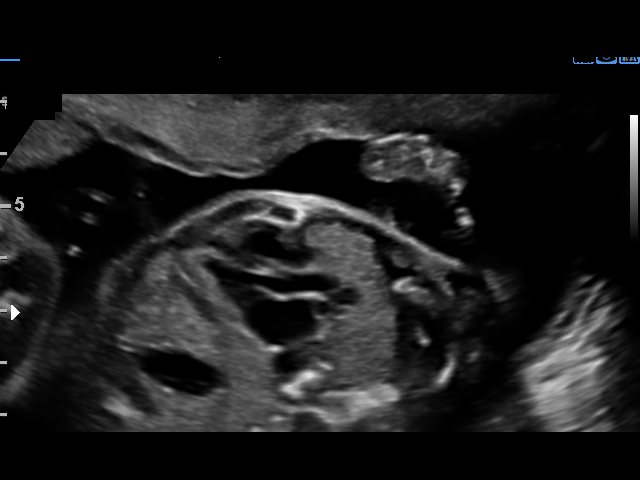
[im 67/67]
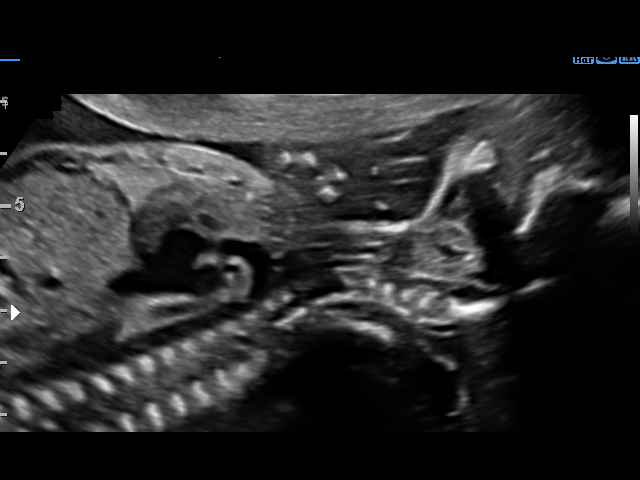

[14 of 28 positions shown; findings below may reference images not displayed]

1  LEANDRO GRADY           [id number]      [id number]     [id number]
Indications

24 weeks gestation of pregnancy
Obesity complicating pregnancy, second
trimester
Late prenatal care, second trimester
Encounter for other antenatal screening
follow-up
Fetal renal anomaly, unspecified fetus
OB History

Blood Type:            Height:  5'9"   Weight (lb):  243       BMI:
Gravidity:    2         Term:   1        Prem:   0        SAB:   0
TOP:          0       Ectopic:  0        Living: 1
Fetal Evaluation

Num Of Fetuses:     1
Fetal Heart         150
Rate(bpm):
Cardiac Activity:   Observed
Presentation:       Cephalic
Placenta:           Fundal, above cervical os
P. Cord Insertion:  Visualized, central

Amniotic Fluid
AFI FV:      Subjectively within normal limits

Largest Pocket(cm)
8.0
Biometry
BPD:      60.3  mm     G. Age:  24w 4d         48  %    CI:        77.45   %    70 - 86
FL/HC:      20.1   %    18.7 -
HC:      216.9  mm     G. Age:  23w 5d         11  %    HC/AC:      1.05        1.05 -
AC:      206.4  mm     G. Age:  25w 2d         66  %    FL/BPD:     72.5   %    71 - 87
FL:       43.7  mm     G. Age:  24w 3d         35  %    FL/AC:      21.2   %    20 - 24
HUM:      41.5  mm     G. Age:  25w 1d         57  %

Est. FW:     727  gm    1 lb 10 oz      58  %
Gestational Age

LMP:           25w 0d        Date:  07/20/16                 EDD:   04/26/17
U/S Today:     24w 4d                                        EDD:   04/29/17
Best:          24w 3d     Det. By:  U/S  (12/14/16)          EDD:   04/30/17
Anatomy

Cranium:               Appears normal         Aortic Arch:            Appears normal
Cavum:                 Previously seen        Ductal Arch:            Appears normal
Ventricles:            Appears normal         Diaphragm:              Previously seen
Choroid Plexus:        Previously seen        Stomach:                Appears normal, left
sided
Cerebellum:            Previously seen        Abdomen:                Previously seen
Posterior Fossa:       Previously seen        Abdominal Wall:         Previously seen
Nuchal Fold:           Not applicable (>20    Cord Vessels:           Previously seen
wks [place])
Face:                  Orbits previously      Kidneys:                Bilat pyelectasis,
seen; prof. Sarah Jane
At1Qmm, Lt 6mm
Lips:                  Previously seen        Bladder:                Appears normal
Thoracic:              Appears normal         Spine:                  Previously seen
Heart:                 Appears normal         Upper Extremities:      Previously seen
(4CH, axis, and situs
RVOT:                  Appears normal         Lower Extremities:      Previously seen
LVOT:                  Appears normal

Other:  Parents do not wish to know sex of fetus. Fetus appears to be a
female. Technically difficult due to maternal habitus and fetal position.
Cervix Uterus Adnexa

Cervix
Length:            3.1  cm.
Normal appearance by transabdominal scan.

Uterus
No abnormality visualized.

Left Ovary
No adnexal mass visualized.

Right Ovary
No adnexal mass visualized.

Cul De Sac:   No free fluid seen.

Adnexa:       No abnormality visualized.
Comments

Bilateral renal renal collecting system dilation was seen (10
mm right, 6 mm left).  Although renal collecting system
dilation may be associated with an increased risk of Down
syndrome, this risk is felt to be minimal, especially when it is
seen as an isolated finding.  It may also be a sign of a
congenital anomaly of the kidneys and urinary tract,
especially if the pylectasis worsens overtime.  If persistant or
worsened at 32 weeks of gestation, a referral to Pediatric
Urology should be considered
Impression

Single living intrauterine pregnancy at 24 weeks 3 days.
Appropriate fetal growth (58%).
Normal amniotic fluid volume.
The fetal anatomic survey is complete.
Bilateral urinary tract dilation--right 10 mm, left 6 mm (UTD 2-
3)
Otherwise normal fetal anatomy.
No fetal anomalies or soft markers of aneuploidy seen.
Recommendations

Recommend follow-up ultrasound examination in 8 weeks to
reassess fetal urinary tract anatomy.

## 2018-06-13 ENCOUNTER — Encounter (HOSPITAL_COMMUNITY): Payer: Self-pay

## 2018-06-13 ENCOUNTER — Ambulatory Visit (HOSPITAL_COMMUNITY)
Admission: EM | Admit: 2018-06-13 | Discharge: 2018-06-13 | Disposition: A | Discharge status: Home / Self Care | Payer: Medicaid Other | Attending: Family Medicine | Admitting: Family Medicine

## 2018-06-13 ENCOUNTER — Other Ambulatory Visit: Payer: Self-pay

## 2018-06-13 DIAGNOSIS — B9789 Other viral agents as the cause of diseases classified elsewhere: Secondary | ICD-10-CM

## 2018-06-13 DIAGNOSIS — J069 Acute upper respiratory infection, unspecified: Secondary | ICD-10-CM | POA: Diagnosis not present

## 2018-06-13 MED ORDER — BENZONATATE 100 MG PO CAPS: 100.0000 mg | ORAL_CAPSULE | Freq: Three times a day (TID) | ORAL | 0 refills | Status: DC

## 2018-06-13 MED ORDER — FLUTICASONE PROPIONATE 50 MCG/ACT NA SUSP: 2.0000 | Freq: Every day | NASAL | 0 refills | Status: DC

## 2018-06-13 MED ORDER — CETIRIZINE-PSEUDOEPHEDRINE ER 5-120 MG PO TB12: 1.0000 | ORAL_TABLET | Freq: Every day | ORAL | 0 refills | Status: DC

## 2018-06-13 NOTE — ED Notes (Signed)
Patient verbalizes understanding of discharge instructions. Opportunity for questioning and answers were provided. Patient discharged from [facility] by APP.  

## 2018-06-13 NOTE — ED Triage Notes (Addendum)
Travel none and fever none. Pt cc coughing and chills x 2 days she has not tried any OTC med's. Pt will need a work note .

## 2018-06-13 NOTE — Discharge Instructions (Addendum)
Get plenty of rest and push fluids Tessalon Perles prescribed for cough Zyrtec-D prescribed for nasal congestion, runny nose, and/or sore throat Flonase prescribed for nasal congestion and runny nose Use medications daily for symptom relief Use OTC medications like ibuprofen or tylenol as needed fever or pain Follow up with PCP or with [facility] if symptoms persist Return or go to ER if you have any new or worsening symptoms fever, chills, nausea, vomiting, chest pain, cough, shortness of breath, wheezing, abdominal pain, changes in bowel or bladder habits, etc..Marland Kitchen

## 2018-06-13 NOTE — ED Provider Notes (Signed)
[facility]   [id number] 06/13/18 Arrival Time: 0945   CC: URI symptoms   SUBJECTIVE: History from: patient.  Sarah Mayo is a 28 y.o. female who presents with runny nose, sore throat, mild cough, subjective fever, chills, and slight headache x 2 days.  Admits to sick exposure to daughter.  Has NOT tried OTC medications without relief.  Reports previous symptoms in the past and diagnosed with URI.   Complains of 1 episode of watery diarrhea.  Denies sneezing, rhinorrhea, congestion, PND, SOB, wheezing, chest pain, nausea, vomiting, changes in bladder habits.    Received flu shot this year: no.  ROS: As per HPI.  Past Medical History:  Diagnosis Date  . Gestational diabetes   . Medical history non-contributory   . No pertinent past medical history   . Sickle cell trait [facility])    Past Surgical History:  Procedure Laterality Date  . NO PAST SURGERIES     No Known Allergies No current facility-administered medications on file prior to encounter.    Current Outpatient Medications on File Prior to Encounter  Medication Sig Dispense Refill  . drospirenone-ethinyl estradiol (YASMIN 28) 3-0.03 MG tablet Take 1 tablet by mouth daily. 1 Package 11   Social History   Socioeconomic History  . Marital status: Single    Spouse name: Not on file  . Number of children: Not on file  . Years of education: Not on file  . Highest education level: Not on file  Occupational History  . Not on file  Social Needs  . Financial resource strain: Not on file  . Food insecurity:    Worry: Not on file    Inability: Not on file  . Transportation needs:    Medical: Not on file    Non-medical: Not on file  Tobacco Use  . Smoking status: Never Smoker  . Smokeless tobacco: Never Used  Substance and Sexual Activity  . Alcohol use: No    Comment: Social, Last drink August 2018  . Drug use: No    Types: Marijuana    Comment: quit with pregnancy  . Sexual activity: Not Currently     Birth control/protection: Pill  Lifestyle  . Physical activity:    Days per week: Not on file    Minutes per session: Not on file  . Stress: Not on file  Relationships  . Social connections:    Talks on phone: Not on file    Gets together: Not on file    Attends religious service: Not on file    Active member of club or organization: Not on file    Attends meetings of clubs or organizations: Not on file    Relationship status: Not on file  . Intimate partner violence:    Fear of current or ex partner: Not on file    Emotionally abused: Not on file    Physically abused: Not on file    Forced sexual activity: Not on file  Other Topics Concern  . Not on file  Social History Narrative  . Not on file   Family History  Problem Relation Age of Onset  . Asthma Brother   . Sickle cell trait Mother     OBJECTIVE:  Vitals:   06/13/18 1015  BP: 106/74  Pulse: 92  Resp: 18  Temp: 98 F (36.7 C)  TempSrc: Oral  SpO2: 100%  Weight: 234 lb (106.1 kg)     General appearance: alert; appears mildly fatigued, but nontoxic; speaking  in full sentences and tolerating own secretions HEENT: NCAT; Ears: EACs clear, TMs pearly gray; Eyes: PERRL.  EOM grossly intact. Nose: nares patent without rhinorrhea, turbinates swollen and mildly erythematous, Throat: oropharynx clear, tonsils non erythematous or enlarged, uvula midline  Neck: supple without LAD Lungs: unlabored respirations, symmetrical air entry; cough: absent, mild; no respiratory distress; CTAB Heart: regular rate and rhythm.  Radial pulses 2+ symmetrical bilaterally Skin: warm and dry Psychological: alert and cooperative; normal mood and affect  ASSESSMENT & PLAN:  1. Viral URI with cough     Meds ordered this encounter  Medications  . fluticasone (FLONASE) 50 MCG/ACT nasal spray    Sig: Place 2 sprays into both nostrils daily.    Dispense:  16 g    Refill:  0    Order Specific Question:   Supervising Provider     Answer:   Eustace Moore [[id number]]  . cetirizine-pseudoephedrine (ZYRTEC-D) 5-120 MG tablet    Sig: Take 1 tablet by mouth daily.    Dispense:  30 tablet    Refill:  0    Order Specific Question:   Supervising Provider    Answer:   Eustace Moore [[id number]]  . benzonatate (TESSALON) 100 MG capsule    Sig: Take 1 capsule (100 mg total) by mouth every 8 (eight) hours.    Dispense:  21 capsule    Refill:  0    Order Specific Question:   Supervising Provider    Answer:   Eustace Moore [[id number]]    Get plenty of rest and push fluids Tessalon Perles prescribed for cough Zyrtec-D prescribed for nasal congestion, runny nose, and/or sore throat Flonase prescribed for nasal congestion and runny nose Use medications daily for symptom relief Use OTC medications like ibuprofen or tylenol as needed fever or pain Follow up with PCP or with [facility] if symptoms persist Return or go to ER if you have any new or worsening symptoms fever, chills, nausea, vomiting, chest pain, cough, shortness of breath, wheezing, abdominal pain, changes in bowel or bladder habits, etc...  Reviewed expectations re: course of current medical issues. Questions answered. Outlined signs and symptoms indicating need for more acute intervention. Patient verbalized understanding. After Visit Summary given.         Rennis Harding, PA-C 06/13/18 1059

## 2019-02-12 ENCOUNTER — Ambulatory Visit (INDEPENDENT_AMBULATORY_CARE_PROVIDER_SITE_OTHER): Payer: 59 | Admitting: Obstetrics & Gynecology

## 2019-02-12 ENCOUNTER — Other Ambulatory Visit: Payer: Self-pay

## 2019-02-12 ENCOUNTER — Other Ambulatory Visit (HOSPITAL_COMMUNITY)
Admission: RE | Admit: 2019-02-12 | Discharge: 2019-02-12 | Disposition: A | Discharge status: Home / Self Care | Payer: 59 | Source: Ambulatory Visit | Attending: Obstetrics & Gynecology | Admitting: Obstetrics & Gynecology

## 2019-02-12 ENCOUNTER — Encounter: Payer: Self-pay | Admitting: Obstetrics & Gynecology

## 2019-02-12 VITALS — BP 117/72 | HR 98 | Ht 69.0 in | Wt 249.0 lb

## 2019-02-12 DIAGNOSIS — Z01419 Encounter for gynecological examination (general) (routine) without abnormal findings: Secondary | ICD-10-CM

## 2019-02-12 DIAGNOSIS — Z6834 Body mass index (BMI) 34.0-34.9, adult: Secondary | ICD-10-CM | POA: Diagnosis not present

## 2019-02-12 NOTE — Progress Notes (Signed)
Patient ID: Sarah Mayo, female   DOB: 02-19-1991, 28 y.o.   MRN: [id number]  Chief Complaint  Patient presents with  . Gynecologic Exam    HPI Sarah Mayo is a 28 y.o. female.  [id number] Patient's last menstrual period was 02/04/2019. She is up to date with her last pap 2 years ago. She stopped taking BCP and will use barrier method for now. She wants to lose weight and has not been consistent with diet/exercise during Covid restrictions. HPI  Past Medical History:  Diagnosis Date  . Gestational diabetes   . Medical history non-contributory   . No pertinent past medical history   . Sickle cell trait [facility])     Past Surgical History:  Procedure Laterality Date  . NO PAST SURGERIES      Family History  Problem Relation Age of Onset  . Asthma Brother   . Sickle cell trait Mother     Social History Social History   Tobacco Use  . Smoking status: Never Smoker  . Smokeless tobacco: Never Used  Substance Use Topics  . Alcohol use: No    Comment: Social, Last drink August 2018  . Drug use: No    Types: Marijuana    Comment: quit with pregnancy    No Known Allergies  Current Outpatient Medications  Medication Sig Dispense Refill  . benzonatate (TESSALON) 100 MG capsule Take 1 capsule (100 mg total) by mouth every 8 (eight) hours. (Patient not taking: Reported on 02/12/2019) 21 capsule 0  . cetirizine-pseudoephedrine (ZYRTEC-D) 5-120 MG tablet Take 1 tablet by mouth daily. (Patient not taking: Reported on 02/12/2019) 30 tablet 0  . drospirenone-ethinyl estradiol (YASMIN 28) 3-0.03 MG tablet Take 1 tablet by mouth daily. (Patient not taking: Reported on 02/12/2019) 1 Package 11  . fluticasone (FLONASE) 50 MCG/ACT nasal spray Place 2 sprays into both nostrils daily. (Patient not taking: Reported on 02/12/2019) 16 g 0   No current facility-administered medications for this visit.     Review of Systems Review of Systems  Constitutional: Positive for unexpected  weight change.  Respiratory: Negative.   Cardiovascular: Negative.   Gastrointestinal: Negative.   Endocrine: Negative.   Genitourinary: Negative.   Musculoskeletal: Negative.     Blood pressure 117/72, pulse 98, height 5\' 9"  (1.753 m), weight 249 lb (112.9 kg), last menstrual period 02/04/2019, not currently breastfeeding.  Physical Exam Physical Exam Vitals signs and nursing note reviewed. Exam conducted with a chaperone present.  Constitutional:      Appearance: Normal appearance. She is not ill-appearing.  Cardiovascular:     Rate and Rhythm: Normal rate.  Pulmonary:     Effort: Pulmonary effort is normal.  Abdominal:     General: There is no distension.     Palpations: Abdomen is soft.     Tenderness: There is no abdominal tenderness.  Genitourinary:    Comments: Pelvic exam: normal external genitalia, vulva, vagina, cervix, uterus and adnexa, exam chaperoned by [facility] RN.  Neurological:     Mental Status: She is alert.   Breasts: breasts appear normal, no suspicious masses, no skin or nipple changes or axillary nodes.  Data Reviewed Weight vitals pap   Assessment Well woman exam  Weight gain, obesity  Plan Exercise and healthy diet discussed Condoms and spermacide for [facility] Pap in one year    Emeterio Reeve 02/12/2019, 9:48 AM

## 2019-02-12 NOTE — Progress Notes (Signed)
Pt is here for annual gyn exam. LMP 02/04/2019. Last pap 12/15/2016 normal. Pt is currently sexually active, she does not desire any contraceptive at this time. Pt requests STD testing today. Pt reports that she would like to talk about weight loss options.

## 2019-02-12 NOTE — Patient Instructions (Signed)
Breast Self-Awareness Breast self-awareness is knowing how your breasts look and feel. Doing breast self-awareness is important. It allows you to catch a breast problem early while it is still small and can be treated. All women should do breast self-awareness, including women who have had breast implants. Tell your doctor if you notice a change in your breasts. What you need:  A mirror.  A well-lit room. How to do a breast self-exam A breast self-exam is one way to learn what is normal for your breasts and to check for changes. To do a breast self-exam: Look for changes  1. Take off all the clothes above your waist. 2. Stand in front of a mirror in a room with good lighting. 3. Put your hands on your hips. 4. Push your hands down. 5. Look at your breasts and nipples in the mirror to see if one breast or nipple looks different from the other. Check to see if: ? The shape of one breast is different. ? The size of one breast is different. ? There are wrinkles, dips, and bumps in one breast and not the other. 6. Look at each breast for changes in the skin, such as: ? Redness. ? Scaly areas. 7. Look for changes in your nipples, such as: ? Liquid around the nipples. ? Bleeding. ? Dimpling. ? Redness. ? A change in where the nipples are. Feel for changes  1. Lie on your back on the floor. 2. Feel each breast. To do this, follow these steps: ? Pick a breast to feel. ? Put the arm closest to that breast above your head. ? Use your other arm to feel the nipple area of your breast. Feel the area with the pads of your three middle fingers by making small circles with your fingers. For the first circle, press lightly. For the second circle, press harder. For the third circle, press even harder. ? Keep making circles with your fingers at the different pressures as you move down your breast. Stop when you feel your ribs. ? Move your fingers a little toward the center of your body. ? Start  making circles with your fingers again, this time going up until you reach your collarbone. ? Keep making up-and-down circles until you reach your armpit. Remember to keep using the three pressures. ? Feel the other breast in the same way. 3. Sit or stand in the tub or shower. 4. With soapy water on your skin, feel each breast the same way you did in step 2 when you were lying on the floor. Write down what you find Writing down what you find can help you remember what to tell your doctor. Write down:  What is normal for each breast.  Any changes you find in each breast, including: ? The kind of changes you find. ? Whether you have pain. ? Size and location of any lumps.  When you last had your menstrual period. General tips  Check your breasts every month.  If you are breastfeeding, the best time to check your breasts is after you feed your baby or after you use a breast pump.  If you get menstrual periods, the best time to check your breasts is 5-7 days after your menstrual period is over.  With time, you will become comfortable with the self-exam, and you will begin to know if there are changes in your breasts. Contact a doctor if you:  See a change in the shape or size of your breasts or  nipples.  See a change in the skin of your breast or nipples, such as red or scaly skin.  Have fluid coming from your nipples that is not normal.  Find a lump or thick area that was not there before.  Have pain in your breasts.  Have any concerns about your breast health. Summary  Breast self-awareness includes looking for changes in your breasts, as well as feeling for changes within your breasts.  Breast self-awareness should be done in front of a mirror in a well-lit room.  You should check your breasts every month. If you get menstrual periods, the best time to check your breasts is 5-7 days after your menstrual period is over.  Let your doctor know of any changes you see in your  breasts, including changes in size, changes on the skin, pain or tenderness, or fluid from your nipples that is not normal. This information is not intended to replace advice given to you by your health care provider. Make sure you discuss any questions you have with your health care provider. Document Released: 08/25/2007 Document Revised: 10/25/2017 Document Reviewed: 10/25/2017 Elsevier Patient Education  2020 [employer]. Exercising to Lose Weight Exercise is structured, repetitive physical activity to improve fitness and health. Getting regular exercise is important for everyone. It is especially important if you are overweight. Being overweight increases your risk of heart disease, stroke, diabetes, high blood pressure, and several types of cancer. Reducing your calorie intake and exercising can help you lose weight. Exercise is usually categorized as moderate or vigorous intensity. To lose weight, most people need to do a certain amount of moderate-intensity or vigorous-intensity exercise each week. Moderate-intensity exercise  Moderate-intensity exercise is any activity that gets you moving enough to burn at least three times more energy (calories) than if you were sitting. Examples of moderate exercise include:  Walking a mile in 15 minutes.  Doing light yard work.  Biking at an easy pace. Most people should get at least 150 minutes (2 hours and 30 minutes) a week of moderate-intensity exercise to maintain their body weight. Vigorous-intensity exercise Vigorous-intensity exercise is any activity that gets you moving enough to burn at least six times more calories than if you were sitting. When you exercise at this intensity, you should be working hard enough that you are not able to carry on a conversation. Examples of vigorous exercise include:  Running.  Playing a team sport, such as football, basketball, and soccer.  Jumping rope. Most people should get at least 75 minutes (1  hour and 15 minutes) a week of vigorous-intensity exercise to maintain their body weight. How can exercise affect me? When you exercise enough to burn more calories than you eat, you lose weight. Exercise also reduces body fat and builds muscle. The more muscle you have, the more calories you burn. Exercise also:  Improves mood.  Reduces stress and tension.  Improves your overall fitness, flexibility, and endurance.  Increases bone strength. The amount of exercise you need to lose weight depends on:  Your age.  The type of exercise.  Any health conditions you have.  Your overall physical ability. Talk to your health care provider about how much exercise you need and what types of activities are safe for you. What actions can I take to lose weight? Nutrition   Make changes to your diet as told by your health care provider or diet and nutrition specialist (dietitian). This may include: ? Eating fewer calories. ? Eating more protein. ?  Eating less unhealthy fats. ? Eating a diet that includes fresh fruits and vegetables, whole grains, low-fat dairy products, and lean protein. ? Avoiding foods with added fat, salt, and sugar.  Drink plenty of water while you exercise to prevent dehydration or heat stroke. Activity  Choose an activity that you enjoy and set realistic goals. Your health care provider can help you make an exercise plan that works for you.  Exercise at a moderate or vigorous intensity most days of the week. ? The intensity of exercise may vary from person to person. You can tell how intense a workout is for you by paying attention to your breathing and heartbeat. Most people will notice their breathing and heartbeat get faster with more intense exercise.  Do resistance training twice each week, such as: ? Push-ups. ? Sit-ups. ? Lifting weights. ? Using resistance bands.  Getting short amounts of exercise can be just as helpful as long structured periods of  exercise. If you have trouble finding time to exercise, try to include exercise in your daily routine. ? Get up, stretch, and walk around every 30 minutes throughout the day. ? Go for a walk during your lunch break. ? Park your car farther away from your destination. ? If you take public transportation, get off one stop early and walk the rest of the way. ? Make phone calls while standing up and walking around. ? Take the stairs instead of elevators or escalators.  Wear comfortable clothes and shoes with good support.  Do not exercise so much that you hurt yourself, feel dizzy, or get very short of breath. Where to find more information  U.S. Department of Health and Human Services: [employer]at  Centers for Disease Control and Prevention (CDC): http://www.wolf.info/ Contact a health care provider:  Before starting a new exercise program.  If you have questions or concerns about your weight.  If you have a medical problem that keeps you from exercising. Get help right away if you have any of the following while exercising:  Injury.  Dizziness.  Difficulty breathing or shortness of breath that does not go away when you stop exercising.  Chest pain.  Rapid heartbeat. Summary  Being overweight increases your risk of heart disease, stroke, diabetes, high blood pressure, and several types of cancer.  Losing weight happens when you burn more calories than you eat.  Reducing the amount of calories you eat in addition to getting regular moderate or vigorous exercise each week helps you lose weight. This information is not intended to replace advice given to you by your health care provider. Make sure you discuss any questions you have with your health care provider. Document Released: 04/10/2010 Document Revised: 03/21/2017 Document Reviewed: 03/21/2017 Elsevier Patient Education  2020 [employer].

## 2019-02-13 LAB — CERVICOVAGINAL ANCILLARY ONLY
Bacterial Vaginitis (gardnerella): POSITIVE — AB
Candida Glabrata: NEGATIVE
Candida Vaginitis: NEGATIVE
Chlamydia: NEGATIVE
Comment: NEGATIVE
Comment: NEGATIVE
Comment: NEGATIVE
Comment: NEGATIVE
Comment: NEGATIVE
Comment: NORMAL
Neisseria Gonorrhea: NEGATIVE
Trichomonas: NEGATIVE

## 2019-02-13 LAB — RPR: RPR Ser Ql: NONREACTIVE

## 2019-02-13 LAB — HEPATITIS B SURFACE ANTIGEN: Hepatitis B Surface Ag: NEGATIVE

## 2019-02-13 LAB — HEPATITIS C ANTIBODY: Hep C Virus Ab: <0.1 s/co ratio (ref 0.0–0.9)

## 2019-02-13 LAB — HIV ANTIBODY (ROUTINE TESTING W REFLEX): HIV Screen 4th Generation wRfx: NONREACTIVE

## 2019-02-21 ENCOUNTER — Other Ambulatory Visit: Payer: Self-pay | Admitting: Obstetrics & Gynecology

## 2019-02-21 DIAGNOSIS — Z30011 Encounter for initial prescription of contraceptive pills: Secondary | ICD-10-CM

## 2019-02-21 MED ORDER — METRONIDAZOLE 500 MG PO TABS: 500.0000 mg | ORAL_TABLET | Freq: Two times a day (BID) | ORAL | 0 refills | Status: AC

## 2019-02-21 NOTE — Progress Notes (Signed)
flagyl

## 2019-03-30 ENCOUNTER — Ambulatory Visit: Payer: 59 | Attending: Internal Medicine

## 2019-03-30 DIAGNOSIS — Z20822 Contact with and (suspected) exposure to covid-19: Secondary | ICD-10-CM

## 2019-03-31 ENCOUNTER — Other Ambulatory Visit: Payer: Self-pay

## 2019-03-31 ENCOUNTER — Encounter (HOSPITAL_COMMUNITY): Payer: Self-pay | Admitting: Emergency Medicine

## 2019-03-31 ENCOUNTER — Emergency Department (HOSPITAL_COMMUNITY)
Admission: EM | Admit: 2019-03-31 | Discharge: 2019-03-31 | Disposition: A | Discharge status: Home / Self Care | Payer: 59 | Attending: Emergency Medicine | Admitting: Emergency Medicine

## 2019-03-31 DIAGNOSIS — R6889 Other general symptoms and signs: Secondary | ICD-10-CM

## 2019-03-31 DIAGNOSIS — Z20822 Contact with and (suspected) exposure to covid-19: Secondary | ICD-10-CM | POA: Diagnosis not present

## 2019-03-31 DIAGNOSIS — J111 Influenza due to unidentified influenza virus with other respiratory manifestations: Secondary | ICD-10-CM | POA: Diagnosis not present

## 2019-03-31 DIAGNOSIS — M791 Myalgia, unspecified site: Secondary | ICD-10-CM | POA: Diagnosis present

## 2019-03-31 LAB — NOVEL CORONAVIRUS, NAA: SARS-CoV-2, NAA: DETECTED — AB

## 2019-03-31 NOTE — Discharge Instructions (Signed)
Please make a concerted effort to eat regular meals and drink plenty of fluids.  I recommend chicken broth, warm tea with honey, cough drops, Delsym, and other over-the-counter medications for symptomatic relief.  Assuming you are not pregnant, naproxen is a great medication for your body aches, chills, headache, and sore throat symptoms.  Return to the ED or seek medical attention for any difficulty breathing, shortness of breath, chest pain, uncontrolled nausea vomiting, fevers are controlled by Tylenol and ibuprofen, or any other significantly worsening symptoms.  Please understand that this COVID-19 can cause symptoms for weeks.

## 2019-03-31 NOTE — ED Triage Notes (Signed)
C/o loss of taste/smell, decreased appetite, chills, headache, and body aches x 2 days.  Tested for COVID at [facility] yesterday and results pending.  Denies fever.

## 2019-03-31 NOTE — ED Provider Notes (Signed)
[facility] EMERGENCY DEPARTMENT Provider Note   CSN: [id number] Arrival date & time: 03/31/19  1497     History Chief Complaint  Patient presents with  . COVID symptoms    test pending    Sarah Mayo is a 29 y.o. female with no relevant PMH who presents to the ED with a 3-day history of body aches, diminished appetite, headache, rhinorrhea, chills, and loss of taste/smell.  She was tested at [facility] yesterday approximately 2 PM and results are currently pending.  On my exam, patient complains of the same symptoms.  She denies any chest pain, difficulty breathing, fevers, difficulty eating or drinking, difficulty swallowing, voice change, abdominal pain, nausea or vomiting, or any neurologic symptoms.  She has been taking NyQuil, with some effect.  She was curious when she would receive her COVID-19 testing results and was requesting a work note.  She lives with her fianc and children, but states that her children are with her mother and she has been isolating from her fianc.  HPI     Past Medical History:  Diagnosis Date  . Gestational diabetes   . Medical history non-contributory   . No pertinent past medical history   . Sickle cell trait [facility])     Patient Active Problem List   Diagnosis Date Noted  . BMI 37.0-37.9, adult 07/23/2014  . Sickle cell trait ([place]) 07/23/2014  . 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 History of marijuana use 07/23/2014    Past Surgical History:  Procedure Laterality Date  . NO PAST SURGERIES       OB History    Gravida  2   Para  2   Term  2   Preterm      AB      Living  2     SAB      TAB      Ectopic      Multiple  0   Live Births  2           Family History  Problem Relation Age of Onset  . Asthma Brother   . Sickle cell trait Mother     Social History   Tobacco Use  . Smoking status: Never Smoker  . Smokeless tobacco: Never Used  Substance Use Topics  . Alcohol use: No   Comment: Social, Last drink August 2018  . Drug use: No    Types: Marijuana    Comment: quit with pregnancy    Home Medications Prior to Admission medications   Medication Sig Start Date End Date Taking? Authorizing Provider  benzonatate (TESSALON) 100 MG capsule Take 1 capsule (100 mg total) by mouth every 8 (eight) hours. Patient not taking: Reported on 02/12/2019 06/13/18   Wurst, [place], PA-C  cetirizine-pseudoephedrine (ZYRTEC-D) 5-120 MG tablet Take 1 tablet by mouth daily. Patient not taking: Reported on 02/12/2019 06/13/18   Wurst, [place], PA-C  drospirenone-ethinyl estradiol (YASMIN 28) 3-0.03 MG tablet Take 1 tablet by mouth daily. Patient not taking: Reported on 02/12/2019 06/06/17   Woodroe Mode, MD  fluticasone [facility]) 50 MCG/ACT nasal spray Place 2 sprays into both nostrils daily. Patient not taking: Reported on 02/12/2019 06/13/18   Lestine Box, PA-C    Allergies    Patient has no known allergies.  Review of Systems   Review of Systems  Constitutional: Negative for fever.  HENT: Negative for drooling and trouble swallowing.   Respiratory: Negative for shortness of breath.   Cardiovascular: Negative  for chest pain.    Physical Exam Updated Vital Signs BP 118/78   Pulse 81   Temp 98.2 F (36.8 C) (Oral)   Resp 16   Ht 5\' 9"  (1.753 m)   Wt 108.9 kg   LMP 03/01/2019   SpO2 100%   BMI 35.44 kg/m   Physical Exam Vitals and nursing note reviewed. Exam conducted with a chaperone present.  Constitutional:      General: She is not in acute distress.    Appearance: Normal appearance. She is not ill-appearing.  HENT:     Head: Normocephalic and atraumatic.  Eyes:     General: No scleral icterus.    Conjunctiva/sclera: Conjunctivae normal.  Cardiovascular:     Rate and Rhythm: Normal rate and regular rhythm.     Pulses: Normal pulses.     Heart sounds: Normal heart sounds.  Pulmonary:     Effort: Pulmonary effort is normal. No respiratory  distress.     Breath sounds: Normal breath sounds. No wheezing or rales.  Abdominal:     General: Abdomen is flat. There is no distension.     Palpations: Abdomen is soft.     Tenderness: There is no abdominal tenderness. There is no guarding.  Musculoskeletal:     Cervical back: Normal range of motion and neck supple. No rigidity.  Skin:    General: Skin is dry.  Neurological:     Mental Status: She is alert and oriented to person, place, and time.     GCS: GCS eye subscore is 4. GCS verbal subscore is 5. GCS motor subscore is 6.  Psychiatric:        Mood and Affect: Mood normal.        Behavior: Behavior normal.        Thought Content: Thought content normal.      ED Results / Procedures / Treatments   Labs (all labs ordered are listed, but only abnormal results are displayed) Labs Reviewed - No data to display  EKG None  Radiology No results found.  Procedures Procedures (including critical care time)  Medications Ordered in ED Medications - No data to display  ED Course  I have reviewed the triage vital signs and the nursing notes.  Pertinent labs & imaging results that were available during my care of the patient were reviewed by me and considered in my medical decision making (see chart for details).    MDM Rules/Calculators/A&P                      Patient is in no acute distress and is resting comfortably on exam.  When asked why she is in the ER for reevaluation, she states that because she is still feeling poorly.  She is also curious when she would be receiving her COVID-19 testing and is requesting a work note.  By her description, sounds as though she received a nasopharyngeal swab and there is no reason for 14/12/2018 to repeat testing here today in the ED.  She has been taking NyQuil, with some effect.  She denies any significant cough or difficulty breathing.  She does endorse mild sore throat and diminished appetite.  Emphasized the importance of regular meals  and increasing oral hydration.  Recommended Chloraseptic spray, cough drops, and warm tea with honey should she develop any sore throat symptoms.  She states that she is not pregnant and I advised that she take naproxen for her body aches and headache symptoms.  It will also be effective for her chills and sore throat symptoms.  Otherwise, strongly encouraged her to isolate at home and to assume that she is COVID-19 positive while results pending.  She states that she works from home and her fianc has not been exhibiting any symptoms, so I encouraged her to continue isolating from him as well.  She is 3 days into her illness, so influenza testing not warranted and would not change management.  Vital signs all within normal limits and patient is an otherwise healthy 29 year old female.  Return precautions discussed.  Patient voices understanding and is agreeable to plan.  NAYLEA WIGINGTON was evaluated in Emergency Department on 03/31/2019 for the symptoms described in the history of present illness. She was evaluated in the context of the global COVID-19 pandemic, which necessitated consideration that the patient might be at risk for infection with the SARS-CoV-2 virus that causes COVID-19. Institutional protocols and algorithms that pertain to the evaluation of patients at risk for COVID-19 are in a state of rapid change based on information released by regulatory bodies including the CDC and federal and state organizations. These policies and algorithms were followed during the patient's care in the ED.   Final Clinical Impression(s) / ED Diagnoses Final diagnoses:  Flu-like symptoms    Rx / DC Orders ED Discharge Orders    None       Lorelee New, PA-C 03/31/19 1019    Pricilla Loveless, MD 03/31/19 [phone]

## 2019-04-07 ENCOUNTER — Inpatient Hospital Stay (HOSPITAL_COMMUNITY)
Admission: AD | Admit: 2019-04-07 | Discharge: 2019-04-07 | Disposition: A | Discharge status: Home / Self Care | Payer: 59 | Attending: Obstetrics and Gynecology | Admitting: Obstetrics and Gynecology

## 2019-04-07 ENCOUNTER — Encounter (HOSPITAL_COMMUNITY): Payer: Self-pay | Admitting: Obstetrics and Gynecology

## 2019-04-07 ENCOUNTER — Other Ambulatory Visit: Payer: Self-pay

## 2019-04-07 ENCOUNTER — Encounter (HOSPITAL_COMMUNITY): Payer: Self-pay

## 2019-04-07 ENCOUNTER — Ambulatory Visit (INDEPENDENT_AMBULATORY_CARE_PROVIDER_SITE_OTHER)
Admission: EM | Admit: 2019-04-07 | Discharge: 2019-04-07 | Disposition: A | Discharge status: Home / Self Care | Payer: 59 | Source: Home / Self Care

## 2019-04-07 ENCOUNTER — Inpatient Hospital Stay (HOSPITAL_COMMUNITY): Payer: 59

## 2019-04-07 DIAGNOSIS — Z3A00 Weeks of gestation of pregnancy not specified: Secondary | ICD-10-CM | POA: Diagnosis not present

## 2019-04-07 DIAGNOSIS — R109 Unspecified abdominal pain: Secondary | ICD-10-CM

## 2019-04-07 DIAGNOSIS — O418X10 Other specified disorders of amniotic fluid and membranes, first trimester, not applicable or unspecified: Secondary | ICD-10-CM | POA: Diagnosis not present

## 2019-04-07 DIAGNOSIS — Z825 Family history of asthma and other chronic lower respiratory diseases: Secondary | ICD-10-CM | POA: Diagnosis not present

## 2019-04-07 DIAGNOSIS — O468X1 Other antepartum hemorrhage, first trimester: Secondary | ICD-10-CM

## 2019-04-07 DIAGNOSIS — D573 Sickle-cell trait: Secondary | ICD-10-CM | POA: Diagnosis not present

## 2019-04-07 DIAGNOSIS — Z3201 Encounter for pregnancy test, result positive: Secondary | ICD-10-CM | POA: Diagnosis not present

## 2019-04-07 DIAGNOSIS — Z8632 Personal history of gestational diabetes: Secondary | ICD-10-CM | POA: Insufficient documentation

## 2019-04-07 DIAGNOSIS — O208 Other hemorrhage in early pregnancy: Secondary | ICD-10-CM | POA: Diagnosis not present

## 2019-04-07 DIAGNOSIS — O26851 Spotting complicating pregnancy, first trimester: Secondary | ICD-10-CM | POA: Diagnosis not present

## 2019-04-07 DIAGNOSIS — Z3490 Encounter for supervision of normal pregnancy, unspecified, unspecified trimester: Secondary | ICD-10-CM

## 2019-04-07 DIAGNOSIS — O99011 Anemia complicating pregnancy, first trimester: Secondary | ICD-10-CM | POA: Diagnosis not present

## 2019-04-07 DIAGNOSIS — R1030 Lower abdominal pain, unspecified: Secondary | ICD-10-CM | POA: Insufficient documentation

## 2019-04-07 DIAGNOSIS — Z3A01 Less than 8 weeks gestation of pregnancy: Secondary | ICD-10-CM | POA: Diagnosis not present

## 2019-04-07 DIAGNOSIS — O26891 Other specified pregnancy related conditions, first trimester: Secondary | ICD-10-CM | POA: Diagnosis not present

## 2019-04-07 DIAGNOSIS — O26899 Other specified pregnancy related conditions, unspecified trimester: Secondary | ICD-10-CM

## 2019-04-07 DIAGNOSIS — N912 Amenorrhea, unspecified: Secondary | ICD-10-CM

## 2019-04-07 LAB — CBC WITH DIFFERENTIAL/PLATELET
Abs Immature Granulocytes: 0.03 10*3/uL (ref 0.00–0.07)
Basophils Absolute: 0 10*3/uL (ref 0.0–0.1)
Basophils Relative: 0 %
Eosinophils Absolute: 0.1 10*3/uL (ref 0.0–0.5)
Eosinophils Relative: 1 %
HCT: 34.1 % — ABNORMAL LOW (ref 36.0–46.0)
Hemoglobin: 11.7 g/dL — ABNORMAL LOW (ref 12.0–15.0)
Immature Granulocytes: 0 %
Lymphocytes Relative: 31 %
Lymphs Abs: 3 10*3/uL (ref 0.7–4.0)
MCH: 26.7 pg (ref 26.0–34.0)
MCHC: 34.3 g/dL (ref 30.0–36.0)
MCV: 77.7 fL — ABNORMAL LOW (ref 80.0–100.0)
Monocytes Absolute: 0.5 10*3/uL (ref 0.1–1.0)
Monocytes Relative: 5 %
Neutro Abs: 6 10*3/uL (ref 1.7–7.7)
Neutrophils Relative %: 63 %
Platelets: 302 10*3/uL (ref 150–400)
RBC: 4.39 MIL/uL (ref 3.87–5.11)
RDW: 13.5 % (ref 11.5–15.5)
WBC: 9.5 10*3/uL (ref 4.0–10.5)
nRBC: 0 % (ref 0.0–0.2)

## 2019-04-07 LAB — URINALYSIS, ROUTINE W REFLEX MICROSCOPIC
Bilirubin Urine: NEGATIVE
Glucose, UA: NEGATIVE mg/dL
Hgb urine dipstick: NEGATIVE
Ketones, ur: 5 mg/dL — AB
Nitrite: NEGATIVE
Protein, ur: 100 mg/dL — AB
Specific Gravity, Urine: 1.031 — ABNORMAL HIGH (ref 1.005–1.030)
pH: 5 (ref 5.0–8.0)

## 2019-04-07 LAB — WET PREP, GENITAL
Clue Cells Wet Prep HPF POC: NONE SEEN
Sperm: NONE SEEN
Trich, Wet Prep: NONE SEEN
Yeast Wet Prep HPF POC: NONE SEEN

## 2019-04-07 LAB — HCG, QUANTITATIVE, PREGNANCY: hCG, Beta Chain, Quant, S: 1904 m[IU]/mL — ABNORMAL HIGH (ref <5)

## 2019-04-07 LAB — POC URINE PREG, ED: Preg Test, Ur: POSITIVE — AB

## 2019-04-07 LAB — POCT PREGNANCY, URINE: Preg Test, Ur: POSITIVE — AB

## 2019-04-07 LAB — HIV ANTIBODY (ROUTINE TESTING W REFLEX): HIV Screen 4th Generation wRfx: NONREACTIVE

## 2019-04-07 NOTE — MAU Provider Note (Signed)
History     CSN: [id number]  Arrival date and time: 04/07/19 1631   First Provider Initiated Contact with Patient 04/07/19 1729      Chief Complaint  Patient presents with  . Abdominal Pain  . Vaginal Bleeding   HPI Ms. Sarah Mayo is a 29 y.o. G3P2002 at [redacted]w[redacted]d who presents to MAU today with complaint of lower abdominal cramping and spotting today. She was at UC earlier and had +UPT confirmed. She states cramping is intermittent and currently rated at 3/10. She noted spotting with wiping this afternoon. She has had some nausea without vomiting, diarrhea or constipation. She denies vaginal discharge or UTI symptoms. She has not taken anything for her pain.   OB History    Gravida  3   Para  2   Term  2   Preterm      AB      Living  2     SAB      TAB      Ectopic      Multiple  0   Live Births  2           Past Medical History:  Diagnosis Date  . Gestational diabetes   . Medical history non-contributory   . No pertinent past medical history   . Sickle cell trait [facility])     Past Surgical History:  Procedure Laterality Date  . NO PAST SURGERIES      Family History  Problem Relation Age of Onset  . Asthma Brother   . Sickle cell trait Mother     Social History   Tobacco Use  . Smoking status: Never Smoker  . Smokeless tobacco: Never Used  Substance Use Topics  . Alcohol use: No    Comment: Social, Last drink August 2018  . Drug use: No    Types: Marijuana    Comment: quit with pregnancy    Allergies: No Known Allergies  No medications prior to admission.    Review of Systems  Constitutional: Negative for fever.  Gastrointestinal: Positive for abdominal pain and nausea. Negative for constipation, diarrhea and vomiting.  Genitourinary: Positive for vaginal bleeding. Negative for dysuria, frequency, urgency and vaginal discharge.   Physical Exam   Blood pressure (!) 117/48, pulse (!) 114, temperature 98.9 F (37.2 C),  temperature source Oral, resp. rate 17, height 5\' 9"  (1.753 m), weight 108.1 kg, last menstrual period 03/01/2019, SpO2 100 %, not currently breastfeeding.  Physical Exam  Nursing note and vitals reviewed. Constitutional: She is oriented to person, place, and time. She appears well-developed and well-nourished. No distress.  HENT:  Head: Normocephalic and atraumatic.  Cardiovascular: Tachycardia present.  Respiratory: Effort normal.  GI: Soft. She exhibits no distension and no mass. There is no abdominal tenderness. There is no rebound and no guarding.  Genitourinary: Uterus is not enlarged and not tender. Cervix exhibits no motion tenderness, no discharge and no friability. Right adnexum displays no mass and no tenderness. Left adnexum displays no mass and no tenderness.    No vaginal discharge or bleeding.  No bleeding in the vagina.  Neurological: She is alert and oriented to person, place, and time.  Skin: Skin is warm and dry. No erythema.  Psychiatric: She has a normal mood and affect.  Cervix: closed, thick, firm   Results for orders placed or performed during the hospital encounter of 04/07/19 (from the past 24 hour(s))  CBC with Differential/Platelet     Status: Abnormal  Collection Time: 04/07/19  5:23 PM  Result Value Ref Range   WBC 9.5 4.0 - 10.5 K/uL   RBC 4.39 3.87 - 5.11 MIL/uL   Hemoglobin 11.7 (L) 12.0 - 15.0 g/dL   HCT 68.0 (L) 32.1 - 22.4 %   MCV 77.7 (L) 80.0 - 100.0 fL   MCH 26.7 26.0 - 34.0 pg   MCHC 34.3 30.0 - 36.0 g/dL   RDW 82.5 00.3 - 70.4 %   Platelets 302 150 - 400 K/uL   nRBC 0.0 0.0 - 0.2 %   Neutrophils Relative % 63 %   Neutro Abs 6.0 1.7 - 7.7 K/uL   Lymphocytes Relative 31 %   Lymphs Abs 3.0 0.7 - 4.0 K/uL   Monocytes Relative 5 %   Monocytes Absolute 0.5 0.1 - 1.0 K/uL   Eosinophils Relative 1 %   Eosinophils Absolute 0.1 0.0 - 0.5 K/uL   Basophils Relative 0 %   Basophils Absolute 0.0 0.0 - 0.1 K/uL   Immature Granulocytes 0 %   Abs  Immature Granulocytes 0.03 0.00 - 0.07 K/uL  hCG, quantitative, pregnancy     Status: Abnormal   Collection Time: 04/07/19  5:23 PM  Result Value Ref Range   hCG, Beta Chain, Quant, S 1,904 (H) <5 mIU/mL  HIV Antibody (routine testing w rflx)     Status: None   Collection Time: 04/07/19  5:23 PM  Result Value Ref Range   HIV Screen 4th Generation wRfx NON REACTIVE NON REACTIVE  Urinalysis, Routine w reflex microscopic     Status: Abnormal   Collection Time: 04/07/19  5:39 PM  Result Value Ref Range   Color, Urine AMBER (A) YELLOW   APPearance HAZY (A) CLEAR   Specific Gravity, Urine 1.031 (H) 1.005 - 1.030   pH 5.0 5.0 - 8.0   Glucose, UA NEGATIVE NEGATIVE mg/dL   Hgb urine dipstick NEGATIVE NEGATIVE   Bilirubin Urine NEGATIVE NEGATIVE   Ketones, ur 5 (A) NEGATIVE mg/dL   Protein, ur 888 (A) NEGATIVE mg/dL   Nitrite NEGATIVE NEGATIVE   Leukocytes,Ua SMALL (A) NEGATIVE   RBC / HPF 0-5 0 - 5 RBC/hpf   WBC, UA 6-10 0 - 5 WBC/hpf   Bacteria, UA MANY (A) NONE SEEN   Squamous Epithelial / LPF 11-20 0 - 5   Mucus PRESENT   Wet prep, genital     Status: Abnormal   Collection Time: 04/07/19  5:39 PM  Result Value Ref Range   Yeast Wet Prep HPF POC NONE SEEN NONE SEEN   Trich, Wet Prep NONE SEEN NONE SEEN   Clue Cells Wet Prep HPF POC NONE SEEN NONE SEEN   WBC, Wet Prep HPF POC FEW (A) NONE SEEN   Sperm NONE SEEN     US OB LESS THAN 14 WEEKS WITH OB TRANSVAGINAL  Result Date: 04/07/2019 CLINICAL DATA:  Abdominal pain and vaginal spotting. EXAM: OBSTETRIC <14 WK [place] AND TRANSVAGINAL OB US TECHNIQUE: Both transabdominal and transvaginal ultrasound examinations were performed for complete evaluation of the gestation as well as the maternal uterus, adnexal regions, and pelvic cul-de-sac. Transvaginal technique was performed to assess early pregnancy. COMPARISON:  None. FINDINGS: Intrauterine gestational sac: Single Yolk sac:  Indeterminate. Embryo:  Not Visualized. Cardiac Activity: Not  Visualized. MSD: 5 mm   5 w   2 d Subchorionic hemorrhage:  Small subarachnoid hemorrhage is present. Maternal uterus/adnexae: Normal appearance of the right ovary. Enlarged left ovary containing multiple cysts, including probable corpus luteum. Retroflexed  uterus. Moderate free pelvic fluid is seen within the cul-de-sac and left adnexa. IMPRESSION: Probable early intrauterine gestational sac, but no definite yolk sac, fetal pole, or cardiac activity yet visualized. Recommend follow-up quantitative B-HCG levels and follow-up US in 14 days to assess viability. This recommendation follows SRU consensus guidelines: Diagnostic Criteria for Nonviable Pregnancy Early in the First Trimester. Malva Limes Med 2013; 859:0931-12. Small subarachnoid hemorrhage. Moderate amount of free fluid in the cul-de-sac and left adnexa. Electronically Signed   By: Ted Mcalpine M.D.   On: 04/07/2019 19:01    MAU Course  Procedures None  MDM +UPT UA, wet prep, GC/chlamydia, CBC, quant hCG, HIV, RPR and [place] today to rule out ectopic pregnancy A+ blood type    Assessment and Plan  A: IUGS with indeterminate YS Subchorionic hemorrhage   P: Discharge home Ectopic/bleeding precautions and pelvic rest discussed Patient advised to follow-up with [facility] on Monday for STAT hCG. They will call her with an appointment time.  Patient may return to MAU as needed or if her condition were to change or worsen   Vonzella Nipple, PA-C 04/07/2019, 7:52 PM

## 2019-04-07 NOTE — Discharge Instructions (Signed)

## 2019-04-07 NOTE — ED Triage Notes (Signed)
Patient presents to Urgent Care with complaints of possible pregnancy since missing a period. Patient reports her last period was 03/01/2019.  Pt also requesting another covid test after testing positive two weeks ago. Pt educated on CDC recommendations regarding restesting, pt verbalized understanding.

## 2019-04-07 NOTE — ED Provider Notes (Signed)
[facility]   MRN: [id number] DOB: [birth year]  Subjective:   Sarah Mayo is a 29 y.o. female presenting for confirmation of pregnancy. Had missed her period. Did an UPT at home, was positive and needs to have it confirmed here.   No current facility-administered medications for this encounter.  Current Outpatient Medications:  .  benzonatate (TESSALON) 100 MG capsule, Take 1 capsule (100 mg total) by mouth every 8 (eight) hours. (Patient not taking: Reported on 02/12/2019), Disp: 21 capsule, Rfl: 0 .  cetirizine-pseudoephedrine (ZYRTEC-D) 5-120 MG tablet, Take 1 tablet by mouth daily. (Patient not taking: Reported on 02/12/2019), Disp: 30 tablet, Rfl: 0 .  drospirenone-ethinyl estradiol (YASMIN 28) 3-0.03 MG tablet, Take 1 tablet by mouth daily. (Patient not taking: Reported on 02/12/2019), Disp: 1 Package, Rfl: 11 .  fluticasone (FLONASE) 50 MCG/ACT nasal spray, Place 2 sprays into both nostrils daily. (Patient not taking: Reported on 02/12/2019), Disp: 16 g, Rfl: 0   No Known Allergies  Past Medical History:  Diagnosis Date  . Gestational diabetes   . Medical history non-contributory   . No pertinent past medical history   . Sickle cell trait [facility])      Past Surgical History:  Procedure Laterality Date  . NO PAST SURGERIES      Family History  Problem Relation Age of Onset  . Asthma Brother   . Sickle cell trait Mother     Social History   Tobacco Use  . Smoking status: Never Smoker  . Smokeless tobacco: Never Used  Substance Use Topics  . Alcohol use: No    Comment: Social, Last drink August 2018  . Drug use: No    Types: Marijuana    Comment: quit with pregnancy    Review of Systems  Constitutional: Negative for fever and malaise/fatigue.  HENT: Negative for congestion, ear pain, sinus pain and sore throat.   Eyes: Negative for discharge and redness.  Respiratory: Negative for cough, hemoptysis, shortness of breath and wheezing.     Cardiovascular: Negative for chest pain.  Gastrointestinal: Negative for abdominal pain, diarrhea, nausea and vomiting.  Genitourinary: Negative for dysuria, flank pain and hematuria.  Musculoskeletal: Negative for myalgias.  Skin: Negative for rash.  Neurological: Negative for dizziness, weakness and headaches.  Psychiatric/Behavioral: Negative for depression and substance abuse.     Objective:   Vitals: BP 127/63 (BP Location: Left Arm)   Pulse 88   Temp 97.9 F (36.6 C) (Oral)   Resp 16   SpO2 100%   Physical Exam Constitutional:      General: She is not in acute distress.    Appearance: Normal appearance. She is well-developed. She is not ill-appearing, toxic-appearing or diaphoretic.  HENT:     Head: Normocephalic and atraumatic.     Nose: Nose normal.     Mouth/Throat:     Mouth: Mucous membranes are moist.     Pharynx: Oropharynx is clear.  Eyes:     General: No scleral icterus.    Extraocular Movements: Extraocular movements intact.     Pupils: Pupils are equal, round, and reactive to light.  Cardiovascular:     Rate and Rhythm: Normal rate.  Pulmonary:     Effort: Pulmonary effort is normal.  Skin:    General: Skin is warm and dry.  Neurological:     General: No focal deficit present.     Mental Status: She is alert and oriented to person, place, and time.  Psychiatric:  Mood and Affect: Mood normal.        Behavior: Behavior normal.        Thought Content: Thought content normal.        Judgment: Judgment normal.     Results for orders placed or performed during the hospital encounter of 04/07/19 (from the past 24 hour(s))  POC urine pregnancy     Status: Abnormal   Collection Time: 04/07/19  2:01 PM  Result Value Ref Range   Preg Test, Ur POSITIVE (A) NEGATIVE  Pregnancy, urine POC     Status: Abnormal   Collection Time: 04/07/19  2:01 PM  Result Value Ref Range   Preg Test, Ur POSITIVE (A) NEGATIVE    Assessment and Plan :   1.  Pregnancy, unspecified gestational age   45. Amenorrhea     General counseling provided including avoidance of alcohol and cigarettes, caffeine.  Counseled on medications generally safe in pregnancy. Counseled patient on potential for adverse effects with medications prescribed/recommended today, ER and return-to-clinic precautions discussed, patient verbalized understanding.    Jaynee Eagles, PA-C 04/07/19 1417

## 2019-04-07 NOTE — MAU Note (Signed)
Sarah Mayo is a 29 y.o. at [redacted]w[redacted]d here in MAU reporting: states when she left UC today she started having some cramping and light spotting. Cramping has since subsided. Had + UPT at home about 3 days ago.  LMP: 03/01/19  Onset of complaint: today  Pain score: 0/10  Vitals:   04/07/19 1654 04/07/19 1655  BP: (!) 123/44   Pulse: (!) 113   Resp: 17   Temp: 98.9 F (37.2 C)   SpO2:  100%     Lab orders placed from triage: UA, pt states cannot give sample at this time since she just gave sample for UPT at [facility]

## 2019-04-08 LAB — CULTURE, OB URINE

## 2019-04-08 LAB — RPR: RPR Ser Ql: NONREACTIVE

## 2019-04-09 ENCOUNTER — Ambulatory Visit (INDEPENDENT_AMBULATORY_CARE_PROVIDER_SITE_OTHER): Payer: 59

## 2019-04-09 ENCOUNTER — Other Ambulatory Visit: Payer: Self-pay | Admitting: Obstetrics and Gynecology

## 2019-04-09 ENCOUNTER — Other Ambulatory Visit: Payer: Self-pay

## 2019-04-09 VITALS — BP 119/62 | HR 91 | Wt 241.0 lb

## 2019-04-09 DIAGNOSIS — O009 Unspecified ectopic pregnancy without intrauterine pregnancy: Secondary | ICD-10-CM | POA: Diagnosis not present

## 2019-04-09 DIAGNOSIS — Z3490 Encounter for supervision of normal pregnancy, unspecified, unspecified trimester: Secondary | ICD-10-CM

## 2019-04-09 DIAGNOSIS — Z3A01 Less than 8 weeks gestation of pregnancy: Secondary | ICD-10-CM | POA: Diagnosis not present

## 2019-04-09 DIAGNOSIS — O4191X0 Disorder of amniotic fluid and membranes, unspecified, first trimester, not applicable or unspecified: Secondary | ICD-10-CM

## 2019-04-09 DIAGNOSIS — Z30011 Encounter for initial prescription of contraceptive pills: Secondary | ICD-10-CM

## 2019-04-09 LAB — GC/CHLAMYDIA PROBE AMP (~~LOC~~) NOT AT ARMC
Chlamydia: NEGATIVE
Comment: NEGATIVE
Comment: NORMAL
Neisseria Gonorrhea: NEGATIVE

## 2019-04-09 NOTE — Progress Notes (Signed)
Pt presents to the clinic for a stat HCG.  Pt denied any abdominal and pelvic pain, vaginal bleeding, and fatigued.  She repots " I feel just fine".  Pt sent to the lab for a stat B-HCG. -EH/RMA

## 2019-04-09 NOTE — Addendum Note (Signed)
Addended by: Thom Chimes on: 04/09/2019 01:21 PM   Modules accepted: Orders

## 2019-04-09 NOTE — Addendum Note (Signed)
Addended by: Thom Chimes on: 04/09/2019 03:56 PM   Modules accepted: Orders

## 2019-04-09 NOTE — Progress Notes (Signed)
Patient with rising quant HCG and early gestational sac seen on 04/07/19 ultrasound. Follow up ultrasound for viability scheduled in 10 days. Ectopic precautions reviewed

## 2019-04-10 LAB — BETA HCG QUANT (REF LAB): hCG Quant: 3197 m[IU]/mL

## 2019-04-23 ENCOUNTER — Ambulatory Visit (INDEPENDENT_AMBULATORY_CARE_PROVIDER_SITE_OTHER): Payer: 59 | Admitting: General Practice

## 2019-04-23 ENCOUNTER — Other Ambulatory Visit: Payer: Self-pay

## 2019-04-23 ENCOUNTER — Ambulatory Visit (HOSPITAL_COMMUNITY)
Admission: RE | Admit: 2019-04-23 | Discharge: 2019-04-23 | Disposition: A | Discharge status: Home / Self Care | Payer: 59 | Source: Ambulatory Visit | Attending: Obstetrics and Gynecology | Admitting: Obstetrics and Gynecology

## 2019-04-23 DIAGNOSIS — Z3490 Encounter for supervision of normal pregnancy, unspecified, unspecified trimester: Secondary | ICD-10-CM

## 2019-04-23 DIAGNOSIS — O26841 Uterine size-date discrepancy, first trimester: Secondary | ICD-10-CM | POA: Diagnosis not present

## 2019-04-23 DIAGNOSIS — Z712 Person consulting for explanation of examination or test findings: Secondary | ICD-10-CM

## 2019-04-23 DIAGNOSIS — Z3A01 Less than 8 weeks gestation of pregnancy: Secondary | ICD-10-CM | POA: Diagnosis not present

## 2019-04-23 NOTE — Progress Notes (Addendum)
Patient presents to office today for viability ultrasound results. Reviewed results with Nolene Bernheim who finds single living IUP- patient should begin OB care.   Reviewed results with patient, change in dating, & provided pictures. Patient reports taking PNV daily. Patient will reach out to [facility] office to schedule initial OB visit.  Chase Caller RN  BSN 04/23/19   Chart reviewed for nurse visit. Agree with plan of care.   Currie Paris, NP 04/23/2019 11:46 AM

## 2019-05-23 DIAGNOSIS — O0993 Supervision of high risk pregnancy, unspecified, third trimester: Secondary | ICD-10-CM | POA: Insufficient documentation

## 2019-05-23 DIAGNOSIS — Z3480 Encounter for supervision of other normal pregnancy, unspecified trimester: Secondary | ICD-10-CM | POA: Insufficient documentation

## 2019-05-24 ENCOUNTER — Encounter: Payer: Self-pay | Admitting: Certified Nurse Midwife

## 2019-05-24 ENCOUNTER — Other Ambulatory Visit (HOSPITAL_COMMUNITY)
Admission: RE | Admit: 2019-05-24 | Discharge: 2019-05-24 | Disposition: A | Discharge status: Home / Self Care | Payer: Medicaid Other | Source: Ambulatory Visit | Attending: Certified Nurse Midwife | Admitting: Certified Nurse Midwife

## 2019-05-24 ENCOUNTER — Other Ambulatory Visit: Payer: Self-pay

## 2019-05-24 ENCOUNTER — Ambulatory Visit (INDEPENDENT_AMBULATORY_CARE_PROVIDER_SITE_OTHER): Payer: Medicaid Other | Admitting: Certified Nurse Midwife

## 2019-05-24 VITALS — BP 140/78 | HR 94 | Wt 240.0 lb

## 2019-05-24 DIAGNOSIS — O99210 Obesity complicating pregnancy, unspecified trimester: Secondary | ICD-10-CM | POA: Insufficient documentation

## 2019-05-24 DIAGNOSIS — O09299 Supervision of pregnancy with other poor reproductive or obstetric history, unspecified trimester: Secondary | ICD-10-CM

## 2019-05-24 DIAGNOSIS — Z3481 Encounter for supervision of other normal pregnancy, first trimester: Secondary | ICD-10-CM

## 2019-05-24 DIAGNOSIS — B9689 Other specified bacterial agents as the cause of diseases classified elsewhere: Secondary | ICD-10-CM

## 2019-05-24 DIAGNOSIS — O09291 Supervision of pregnancy with other poor reproductive or obstetric history, first trimester: Secondary | ICD-10-CM

## 2019-05-24 DIAGNOSIS — O99211 Obesity complicating pregnancy, first trimester: Secondary | ICD-10-CM

## 2019-05-24 DIAGNOSIS — Z3480 Encounter for supervision of other normal pregnancy, unspecified trimester: Secondary | ICD-10-CM

## 2019-05-24 DIAGNOSIS — N760 Acute vaginitis: Secondary | ICD-10-CM

## 2019-05-24 DIAGNOSIS — Z8632 Personal history of gestational diabetes: Secondary | ICD-10-CM

## 2019-05-24 MED ORDER — PRENATE MINI 18-0.6-0.4-350 MG PO CAPS: 1.0000 | ORAL_CAPSULE | Freq: Every day | ORAL | 7 refills | Status: DC

## 2019-05-24 MED ORDER — ASPIRIN EC 81 MG PO TBEC: 81.0000 mg | DELAYED_RELEASE_TABLET | Freq: Every day | ORAL | 0 refills | Status: DC

## 2019-05-24 NOTE — Patient Instructions (Signed)
Healthy Weight Gain During Pregnancy, Adult A certain amount of weight gain during pregnancy is normal and healthy. How much weight you should gain depends on your overall health and a measurement called BMI (body mass index). BMI is an estimate of your body fat based on your height and weight. You can use an online calculator to figure out your BMI, or you can ask your health care provider to calculate it for you at your next visit. Your recommended pregnancy weight gain is based on your pre-pregnancy BMI. General guidelines for a healthy total weight gain during pregnancy are listed below. If your BMI at or before the start of your pregnancy is:  Less than 18.5 (underweight), you should gain 28-40 lb (13-18 kg).  18.5-24.9 (normal weight), you should gain 25-35 lb (11-16 kg).  25-29.9 (overweight), you should gain 15-25 lb (7-11 kg).  30 or higher (obese), you should gain 11-20 lb (5-9 kg). These ranges vary depending on your individual health. If you are carrying more than one baby (multiples), it may be safe to gain more weight than these recommendations. If you gain less weight than recommended, that may be safe as long as your baby is growing and developing normally. How can unhealthy weight gain affect me and my baby? Gaining too much weight during pregnancy can lead to pregnancy complications, such as:  A temporary form of diabetes that develops during pregnancy (gestational diabetes).  High blood pressure during pregnancy and protein in your urine (preeclampsia).  High blood pressure during pregnancy without protein in your urine (gestational hypertension).  Your baby having a high weight at birth, which may: ? Raise your risk of having a more difficult delivery or a surgical delivery (cesarean delivery, or C-section). ? Raise your child's risk of developing obesity during childhood. Not gaining enough weight can be life-threatening for your baby, and it may raise your baby's chances  of:  Being born early (preterm).  Growing more slowly than normal during pregnancy (growth restriction).  Having a low weight at birth. What actions can I take to gain a healthy amount of weight during pregnancy? General instructions  Keep track of your weight gain during pregnancy.  Take over-the-counter and prescription medicines only as told by your health care provider. Take all prenatal supplements as directed.  Keep all health care visits during pregnancy (prenatal visits). These visits are a good time to discuss your weight gain. Your health care provider will weigh you at each visit to make sure you are gaining a healthy amount of weight. Nutrition   Eat a balanced, nutrient-rich diet. Eat plenty of: ? Fruits and vegetables, such as berries and broccoli. ? Whole grains, such as millet, barley, whole-wheat breads and cereals, and oatmeal. ? Low-fat dairy products or non-dairy products such as almond milk or rice milk. ? Protein foods, such as lean meat, chicken, eggs, and legumes (such as peas, beans, soybeans, and lentils).  Avoid foods that are fried or have a lot of fat, salt (sodium), or sugar.  Drink enough fluid to keep your urine pale yellow.  Choose healthy snack and drink options when you are at work or on the go: ? Drink water. Avoid soda, sports drinks, and juices that have added sugar. ? Avoid drinks with caffeine, such as coffee and energy drinks. ? Eat snacks that are high in protein, such as nuts, protein bars, and low-fat yogurt. ? Carry convenient snacks in your purse that do not need refrigeration, such as a pack of   trail mix, an apple, or a granola bar.  If you need help improving your diet, work with a health care provider or a diet and nutrition specialist (dietitian). Activity   Exercise regularly, as told by your health care provider. ? If you were active before becoming pregnant, you may be able to continue your regular fitness activities. ? If  you were not active before pregnancy, you may gradually build up to exercising for 30 or more minutes on most days of the week. This may include walking, swimming, or yoga.  Ask your health care provider what activities are safe for you. Talk with your health care provider about whether you may need to be excused from certain school or work activities. Where to find more information Learn more about managing your weight gain during pregnancy from:  American Pregnancy Association: www.americanpregnancy.org  U.S. Department of Agriculture pregnancy weight gain calculator: www.choosemyplate.gov Summary  Too much weight gain during pregnancy can lead to complications for you and your baby.  Find out your pre-pregnancy BMI to determine how much weight gain is healthy for you.  Eat nutritious foods and stay active.  Keep all of your prenatal visits as told by your health care provider. This information is not intended to replace advice given to you by your health care provider. Make sure you discuss any questions you have with your health care provider. Document Revised: 11/29/2018 Document Reviewed: 11/26/2016 Elsevier Patient Education  2020 Elsevier Inc.  

## 2019-05-24 NOTE — Progress Notes (Signed)
History:   Sarah Mayo is a 29 y.o. G3P2002 at 51w2dby LMP being seen today for her first obstetrical visit.  Her obstetrical history is significant for macrosomia, obesity and hx of GDM. Patient does intend to breast feed. Pregnancy history fully reviewed.  Patient reports no complaints.     HISTORY: OB History  Gravida Para Term Preterm AB Living  _0 0 0 2  SAB TAB Ectopic Multiple Live Births  0 0 0 0 2    # Outcome Date GA Lbr Len/2nd Weight Sex Delivery Anes PTL Lv  3 Current           2 Term 05/03/17 459w3d9 lb 6.3 oz (4.26 kg) F Vag-Spont EPI  LIV     Name: GILMORE,HAILEY [name]     Apgar1: 8  Apgar5: 9  1 Term 07/23/14 383w5d:00 / 00:02 7 lb 10.1 oz (3.46 kg) M Vag-Spont Local  LIV     Birth Comments: large dark birth mark on R abdomen     Name: Mccomb,JULIAN D     Apgar1: 8  Apgar5: 8    Last pap smear was done 11/2016 and was normal  Past Medical History:  Diagnosis Date  . Gestational diabetes   . Medical history non-contributory   . No pertinent past medical history   . [facility]  Past Surgical History:  Procedure Laterality Date  . NO PAST SURGERIES     Family History  Problem Relation Age of Onset  . Asthma Brother   . Sickle cell trait Mother    Social History   Tobacco Use  . Smoking status: Never Smoker  . Smokeless tobacco: Never Used  Substance Use Topics  . Alcohol use: No    Comment: Social, Last drink August 2018  . Drug use: No    Types: Marijuana    Comment: quit with pregnancy   No Known Allergies Current Outpatient Medications on File Prior to Visit  Medication Sig Dispense Refill  . [DISCONTINUED] drospirenone-ethinyl estradiol (YASMIN 28) 3-0.03 MG tablet Take 1 tablet by mouth daily. (Patient not taking: Reported on 02/12/2019) 1 Package 11  . [DISCONTINUED] fluticasone (FLONASE) 50 MCG/ACT nasal spray Place 2 sprays into both nostrils daily. (Patient not taking: Reported on 02/12/2019) 16 g 0    No current facility-administered medications on file prior to visit.    Review of Systems Pertinent items noted in HPI and remainder of comprehensive ROS otherwise negative. Physical Exam:   Vitals:   05/24/19 0823  BP: 140/78  Pulse: 94  Weight: 240 lb (108.9 kg)   Fetal Heart Rate (bpm): US-150 Pelvic Exam: Perineum: no hemorrhoids, normal perineum   Vulva: normal external genitalia, no lesions   Vagina:  normal mucosa, moderate amount of white thin discharge without odor    Cervix: no lesions and normal, pap smear done.    Adnexa: normal adnexa and no mass, fullness, tenderness   Bony Pelvis: average  System: General: well-developed, obese female in no acute distress   Breasts:  normal appearance, no masses or tenderness bilaterally   Skin: normal coloration and turgor, no rashes   Neurologic: oriented, normal, negative, normal mood   Extremities: normal strength, tone, and muscle mass, ROM of all joints is normal   HEENT PERRLA, extraocular movement intact and sclera clear   Mouth/Teeth mucous membranes moist, pharynx normal without lesions and dental hygiene good   Neck supple and no masses  Cardiovascular: regular rate and rhythm   Respiratory:  no respiratory distress, normal breath sounds   Abdomen: soft, non-tender; bowel sounds normal; no masses,  no organomegaly  Bedside Ultrasound for FHR check: Patient informed that the ultrasound is considered a limited obstetric ultrasound and is not intended to be a complete ultrasound exam.  Patient also informed that the ultrasound is not being completed with the intent of assessing for fetal or placental anomalies or any pelvic abnormalities.  Explained that the purpose of today's ultrasound is to assess for fetal heart rate.  Patient acknowledges the purpose of the exam and the limitations of the study.    FHR 150 by bedside US    Assessment:    Pregnancy: G3P2002 Patient Active Problem List   Diagnosis Date Noted  .  Obesity during pregnancy, antepartum 05/24/2019  . Hx of gestational diabetes in prior pregnancy, currently pregnant 05/24/2019  . Supervision of other normal pregnancy, antepartum 05/23/2019  . BMI 37.0-37.9, adult 07/23/2014  . Sickle cell trait (HCC) 07/23/2014  . 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 History of marijuana use 07/23/2014     Plan:    1. Supervision of other normal pregnancy, antepartum - Welcomed back to practice and introduced self to patient  - Reviewed safety, visitor policy, reassurance about COVID-19 for pregnancy at this time. Discussed possible changes to visits, including televisits, that may occur due to COVID-19.  The office remains open if pt needs to be seen and MAU is open 24 hours/day for OB emergencies. - Anticipatory guidance on upcoming appointments  - Obstetric Panel, Including HIV - Urine Culture - Cervicovaginal ancillary only - Cytology - PAP - Hepatitis C Antibody - US MFM OB DETAIL +14 WK; Future - Prenat-FeCbn-FeAsp-Meth-FA-DHA (PRENATE MINI) 18-0.6-0.4-350 MG CAPS; Take 1 tablet by mouth daily.  Dispense: 60 capsule; Refill: 7 - Genetic Screening  2. Obesity during pregnancy, antepartum - BMI 35.4 at initial prenatal appointment  - Educated and discussed weight gain recommendations during pregnancy, diet and exercise. Patient reports currently consuming fast food (whopper with cheese or baconator) multiple times a week, encouraged patient to cut back on fast food and increase fruits and vegetables in diet.  - baseline labs obtained today  - HgB A1c - Protein / creatinine ratio, urine - Comp Met (CMET) - aspirin EC 81 MG tablet; Take 1 tablet (81 mg total) by mouth daily. Take after 12 weeks for prevention of preeclampsia later in pregnancy. Start taking after 05/31/2019  Dispense: 300 tablet; Refill: 0   3. Hx of gestational diabetes in prior pregnancy, currently pregnant - Hx of macrosomia, last baby 9lb 6oz at time of delivery  - HgB  A1c    Initial labs drawn. Rx for prenatal vitamins sent to pharmacy of choice. Genetic Screening discussed, NIPS: ordered. Ultrasound discussed; fetal anatomic survey: ordered. Problem list reviewed and updated. The nature of [facility] with multiple MDs and other Advanced Practice Providers was explained to patient; also emphasized that residents, students are part of our team. Routine obstetric precautions reviewed. Return in about 4 weeks (around 06/21/2019) for ROB-mychart.      C , CNM [facility], [facility]   

## 2019-05-25 LAB — OBSTETRIC PANEL, INCLUDING HIV
Antibody Screen: NEGATIVE
Basophils Absolute: 0 10*3/uL (ref 0.0–0.2)
Basos: 0 %
EOS (ABSOLUTE): 0.1 10*3/uL (ref 0.0–0.4)
Eos: 1 %
HIV Screen 4th Generation wRfx: NONREACTIVE
Hematocrit: 36 % (ref 34.0–46.6)
Hemoglobin: 11.7 g/dL (ref 11.1–15.9)
Hepatitis B Surface Ag: NEGATIVE
Immature Grans (Abs): 0 10*3/uL (ref 0.0–0.1)
Immature Granulocytes: 0 %
Lymphocytes Absolute: 2.2 10*3/uL (ref 0.7–3.1)
Lymphs: 19 %
MCH: 26.7 pg (ref 26.6–33.0)
MCHC: 32.5 g/dL (ref 31.5–35.7)
MCV: 82 fL (ref 79–97)
Monocytes Absolute: 0.4 10*3/uL (ref 0.1–0.9)
Monocytes: 3 %
Neutrophils Absolute: 8.7 10*3/uL — ABNORMAL HIGH (ref 1.4–7.0)
Neutrophils: 77 %
Platelets: 308 10*3/uL (ref 150–450)
RBC: 4.39 x10E6/uL (ref 3.77–5.28)
RDW: 14.6 % (ref 11.7–15.4)
RPR Ser Ql: NONREACTIVE
Rh Factor: POSITIVE
Rubella Antibodies, IGG: 2.72 index (ref >0.99)
WBC: 11.4 10*3/uL — ABNORMAL HIGH (ref 3.4–10.8)

## 2019-05-25 LAB — HEPATITIS C ANTIBODY: Hep C Virus Ab: <0.1 s/co ratio (ref 0.0–0.9)

## 2019-05-25 LAB — CERVICOVAGINAL ANCILLARY ONLY
Bacterial Vaginitis (gardnerella): POSITIVE — AB
Candida Glabrata: NEGATIVE
Candida Vaginitis: NEGATIVE
Chlamydia: NEGATIVE
Comment: NEGATIVE
Comment: NEGATIVE
Comment: NEGATIVE
Comment: NEGATIVE
Comment: NEGATIVE
Comment: NORMAL
Neisseria Gonorrhea: NEGATIVE
Trichomonas: NEGATIVE

## 2019-05-25 LAB — COMPREHENSIVE METABOLIC PANEL
ALT: 38 IU/L — ABNORMAL HIGH (ref 0–32)
AST: 15 IU/L (ref 0–40)
Albumin/Globulin Ratio: 1.5 (ref 1.2–2.2)
Albumin: 4.1 g/dL (ref 3.9–5.0)
Alkaline Phosphatase: 71 IU/L (ref 39–117)
BUN/Creatinine Ratio: 17 (ref 9–23)
BUN: 9 mg/dL (ref 6–20)
Bilirubin Total: 0.2 mg/dL (ref 0.0–1.2)
CO2: 20 mmol/L (ref 20–29)
Calcium: 9 mg/dL (ref 8.7–10.2)
Chloride: 101 mmol/L (ref 96–106)
Creatinine, Ser: 0.53 mg/dL — ABNORMAL LOW (ref 0.57–1.00)
GFR calc Af Amer: 150 mL/min/{1.73_m2} (ref >59)
GFR calc non Af Amer: 131 mL/min/{1.73_m2} (ref >59)
Globulin, Total: 2.7 g/dL (ref 1.5–4.5)
Glucose: 96 mg/dL (ref 65–99)
Potassium: 4 mmol/L (ref 3.5–5.2)
Sodium: 136 mmol/L (ref 134–144)
Total Protein: 6.8 g/dL (ref 6.0–8.5)

## 2019-05-25 LAB — HEMOGLOBIN A1C
Est. average glucose Bld gHb Est-mCnc: 88 mg/dL
Hgb A1c MFr Bld: 4.7 % — ABNORMAL LOW (ref 4.8–5.6)

## 2019-05-25 LAB — CYTOLOGY - PAP: Diagnosis: NEGATIVE

## 2019-05-25 LAB — PROTEIN / CREATININE RATIO, URINE
Creatinine, Urine: 221.8 mg/dL
Protein, Ur: 41.3 mg/dL
Protein/Creat Ratio: 186 mg/g creat (ref 0–200)

## 2019-05-26 LAB — URINE CULTURE

## 2019-05-26 MED ORDER — METRONIDAZOLE 500 MG PO TABS: 500.0000 mg | ORAL_TABLET | Freq: Two times a day (BID) | ORAL | 0 refills | Status: DC

## 2019-05-26 NOTE — Addendum Note (Signed)
Addended by: Sharyon Cable on: 05/26/2019 05:04 PM   Modules accepted: Orders

## 2019-05-30 ENCOUNTER — Telehealth: Payer: Self-pay

## 2019-05-30 NOTE — Telephone Encounter (Signed)
Returned call and pt stated that she has been feeling a little bit of cramps and no fetal flutters, advised pt that cramps can be normal in pregnancy and it is to early to feel movement. Advised if bleeding occurs to got to hospital, pt agreed.

## 2019-06-01 ENCOUNTER — Telehealth: Payer: Self-pay

## 2019-06-01 DIAGNOSIS — Z3480 Encounter for supervision of other normal pregnancy, unspecified trimester: Secondary | ICD-10-CM

## 2019-06-01 MED ORDER — VITAFOL FE+ 90-0.6-0.4-200 MG PO CAPS: 1.0000 | ORAL_CAPSULE | Freq: Every day | ORAL | 12 refills | Status: DC

## 2019-06-01 MED ORDER — BLOOD PRESSURE KIT DEVI: 1.0000 | 0 refills | Status: DC | PRN

## 2019-06-01 NOTE — Telephone Encounter (Signed)
Returned call sent in new prenatal rx because previous was not covered and sent BP cuff to [facility].

## 2019-06-04 ENCOUNTER — Encounter: Payer: Self-pay | Admitting: Certified Nurse Midwife

## 2019-06-05 ENCOUNTER — Encounter (HOSPITAL_COMMUNITY): Payer: Self-pay | Admitting: Obstetrics & Gynecology

## 2019-06-05 ENCOUNTER — Inpatient Hospital Stay (HOSPITAL_COMMUNITY)
Admission: AD | Admit: 2019-06-05 | Discharge: 2019-06-05 | Disposition: A | Discharge status: Home / Self Care | Payer: Medicaid Other | Attending: Obstetrics & Gynecology | Admitting: Obstetrics & Gynecology

## 2019-06-05 ENCOUNTER — Other Ambulatory Visit: Payer: Self-pay

## 2019-06-05 DIAGNOSIS — R109 Unspecified abdominal pain: Secondary | ICD-10-CM

## 2019-06-05 DIAGNOSIS — Z3A13 13 weeks gestation of pregnancy: Secondary | ICD-10-CM | POA: Insufficient documentation

## 2019-06-05 DIAGNOSIS — N939 Abnormal uterine and vaginal bleeding, unspecified: Secondary | ICD-10-CM

## 2019-06-05 DIAGNOSIS — R1030 Lower abdominal pain, unspecified: Secondary | ICD-10-CM | POA: Diagnosis not present

## 2019-06-05 DIAGNOSIS — O26891 Other specified pregnancy related conditions, first trimester: Secondary | ICD-10-CM | POA: Diagnosis not present

## 2019-06-05 DIAGNOSIS — O209 Hemorrhage in early pregnancy, unspecified: Secondary | ICD-10-CM | POA: Diagnosis not present

## 2019-06-05 LAB — URINALYSIS, ROUTINE W REFLEX MICROSCOPIC
Bilirubin Urine: NEGATIVE
Glucose, UA: NEGATIVE mg/dL
Hgb urine dipstick: NEGATIVE
Ketones, ur: NEGATIVE mg/dL
Nitrite: NEGATIVE
Protein, ur: NEGATIVE mg/dL
Specific Gravity, Urine: 1.017 (ref 1.005–1.030)
pH: 6 (ref 5.0–8.0)

## 2019-06-05 NOTE — Discharge Instructions (Signed)
Vaginal Bleeding During Pregnancy, First Trimester  A small amount of bleeding from the vagina (spotting) is relatively common during early pregnancy. It usually stops on its own. Various things may cause bleeding or spotting during early pregnancy. Some bleeding may be related to the pregnancy, and some may not. In many cases, the bleeding is normal and is not a problem. However, bleeding can also be a sign of something serious. Be sure to tell your health care provider about any vaginal bleeding right away. Some possible causes of vaginal bleeding during the first trimester include:  Infection or inflammation of the cervix.  Growths (polyps) on the cervix.  Miscarriage or threatened miscarriage.  Pregnancy tissue developing outside of the uterus (ectopic pregnancy).  A mass of tissue developing in the uterus due to an egg being fertilized incorrectly (molar pregnancy). Follow these instructions at home: Activity  Follow instructions from your health care provider about limiting your activity. Ask what activities are safe for you.  If needed, make plans for someone to help with your regular activities.  Do not have sex or orgasms until your health care provider says that this is safe. General instructions  Take over-the-counter and prescription medicines only as told by your health care provider.  Pay attention to any changes in your symptoms.  Do not use tampons or douche.  Write down how many pads you use each day, how often you change pads, and how soaked (saturated) they are.  If you pass any tissue from your vagina, save the tissue so you can show it to your health care provider.  Keep all follow-up visits as told by your health care provider. This is important. Contact a health care provider if:  You have vaginal bleeding during any part of your pregnancy.  You have cramps or labor pains.  You have a fever. Get help right away if:  You have severe cramps in your  back or abdomen.  You pass large clots or a large amount of tissue from your vagina.  Your bleeding increases.  You feel light-headed or weak, or you faint.  You have chills.  You are leaking fluid or have a gush of fluid from your vagina. Summary  A small amount of bleeding (spotting) from the vagina is relatively common during early pregnancy.  Various things may cause bleeding or spotting in early pregnancy.  Be sure to tell your health care provider about any vaginal bleeding right away. This information is not intended to replace advice given to you by your health care provider. Make sure you discuss any questions you have with your health care provider. Document Revised: 06/27/2018 Document Reviewed: 06/10/2016 Elsevier Patient Education  2020 Elsevier Inc.  

## 2019-06-05 NOTE — MAU Note (Signed)
Been having some abd pain and some light spotting, started yesterday.  Has BP machine at home, BP has been high 148/68, 139/64

## 2019-06-05 NOTE — MAU Provider Note (Signed)
Chief Complaint: Abdominal Pain, Vaginal Bleeding, and Hypertension   First Provider Initiated Contact with Patient 06/05/19 1519     SUBJECTIVE HPI: Sarah Mayo is a 29 y.o. G3P2002 at 59w0dwho presents to Maternity Admissions reporting abdominal cramping & vaginal spotting. Symptoms started 2 days ago. Reports light pink spotting when she wipes. Denies vaginal discharge, vaginal irritation, or recent intercourse. Lower abdominal cramping feels more like pressure. Comes & goes. Denies n/v/d, constipation, or dysuria.   Location: abdomen Quality: pressure, cramping Severity: 6/10 on pain scale Duration: 2 days Timing: intermittent Modifying factors: none Associated signs and symptoms: vaginal spotting  Past Medical History:  Diagnosis Date  . Gestational diabetes   . [place]    OB History  Gravida Para Term Preterm AB Living  _0 SAB TAB Ectopic Multiple Live Births        0 2    # Outcome Date GA Lbr Len/2nd Weight Sex Delivery Anes PTL Lv  3 Current           2 Term 05/03/17 458w3d4260 g F Vag-Spont EPI  LIV  1 Term 07/23/14 3854w5d:00 / 00:02 3460 g M Vag-Spont Local  LIV     Birth Comments: large dark birth mark on R abdomen   Past Surgical History:  Procedure Laterality Date  . NO PAST SURGERIES     Social History   Socioeconomic History  . Marital status: Single    Spouse name: Not on file  . Number of children: Not on file  . Years of education: Not on file  . Highest education level: Not on file  Occupational History  . Not on file  Tobacco Use  . Smoking status: Never Smoker  . Smokeless tobacco: Never Used  Substance and Sexual Activity  . Alcohol use: No    Comment: Social, Last drink August 2018  . Drug use: No    Types: Marijuana    Comment: quit with pregnancy  . Sexual activity: Not Currently  Other Topics Concern  . Not on file  Social History Narrative  . Not on file   Social Determinants of Health    Financial Resource Strain:   . Difficulty of Paying Living Expenses:   Food Insecurity:   . Worried About RunCharity fundraiser the Last Year:   . RanArboriculturist the Last Year:   Transportation Needs:   . LacFilm/video editoredical):   . LMarland Kitchenck of Transportation (Non-Medical):   Physical Activity:   . Days of Exercise per Week:   . Minutes of Exercise per Session:   Stress:   . Feeling of Stress :   Social Connections:   . Frequency of Communication with Friends and Family:   . Frequency of Social Gatherings with Friends and Family:   . Attends Religious Services:   . Active Member of Clubs or Organizations:   . Attends CluArchivistetings:   . MMarland Kitchenrital Status:   Intimate Partner Violence:   . Fear of Current or Ex-Partner:   . Emotionally Abused:   . PMarland Kitchenysically Abused:   . Sexually Abused:    Family History  Problem Relation Age of Onset  . Asthma Brother   . Sickle cell trait Mother    No current facility-administered medications on file prior to encounter.   Current Outpatient Medications on File Prior to Encounter  Medication Sig Dispense Refill  .  metroNIDAZOLE (FLAGYL) 500 MG tablet Take 1 tablet (500 mg total) by mouth 2 (two) times daily. 14 tablet 0  . Prenat-Fe Poly-Methfol-FA-DHA (VITAFOL FE+) 90-0.6-0.4-200 MG CAPS Take 1 tablet by mouth daily. 30 capsule 12  . aspirin EC 81 MG tablet Take 1 tablet (81 mg total) by mouth daily. Take after 12 weeks for prevention of preeclampsia later in pregnancy. Start taking after 05/31/2019 300 tablet 0  . Blood Pressure Monitoring (BLOOD PRESSURE KIT) DEVI 1 kit by Does not apply route as needed. 1 each 0  . Prenat-FeCbn-FeAsp-Meth-FA-DHA (PRENATE MINI) 18-0.6-0.4-350 MG CAPS Take 1 tablet by mouth daily. 60 capsule 7  . [DISCONTINUED] drospirenone-ethinyl estradiol (YASMIN 28) 3-0.03 MG tablet Take 1 tablet by mouth daily. (Patient not taking: Reported on 02/12/2019) 1 Package 11  . [DISCONTINUED]  fluticasone (FLONASE) 50 MCG/ACT nasal spray Place 2 sprays into both nostrils daily. (Patient not taking: Reported on 02/12/2019) 16 g 0   No Known Allergies  I have reviewed patient's Past Medical Hx, Surgical Hx, Family Hx, Social Hx, medications and allergies.   Review of Systems  Constitutional: Negative.   Gastrointestinal: Positive for abdominal pain. Negative for constipation, diarrhea, nausea and vomiting.  Genitourinary: Positive for vaginal bleeding. Negative for dysuria, frequency, hematuria and vaginal discharge.    OBJECTIVE Patient Vitals for the past 24 hrs:  BP Temp Temp src Pulse Resp SpO2 Height Weight  06/05/19 1502 139/62 -- -- 92 -- -- -- --  06/05/19 1214 135/73 99.1 F (37.3 C) Oral 91 18 100 % _0  (1.753 m) 112.9 kg   Constitutional: Well-developed, well-nourished female in no acute distress.  Cardiovascular: normal rate & rhythm, no murmur Respiratory: normal rate and effort. Lung sounds clear throughout GI: Abd soft, non-tender, Pos BS x 4. No guarding or rebound tenderness MS: Extremities nontender, no edema, normal ROM Neurologic: Alert and oriented x 4.  GU:     SPECULUM EXAM: NEFG, physiologic discharge, no blood noted, cervix clean  BIMANUAL: No CMT. cervix closed    LAB RESULTS Results for orders placed or performed during the hospital encounter of 06/05/19 (from the past 24 hour(s))  Urinalysis, Routine w reflex microscopic     Status: Abnormal   Collection Time: 06/05/19 12:36 PM  Result Value Ref Range   Color, Urine YELLOW YELLOW   APPearance HAZY (A) CLEAR   Specific Gravity, Urine 1.017 1.005 - 1.030   pH 6.0 5.0 - 8.0   Glucose, UA NEGATIVE NEGATIVE mg/dL   Hgb urine dipstick NEGATIVE NEGATIVE   Bilirubin Urine NEGATIVE NEGATIVE   Ketones, ur NEGATIVE NEGATIVE mg/dL   Protein, ur NEGATIVE NEGATIVE mg/dL   Nitrite NEGATIVE NEGATIVE   Leukocytes,Ua LARGE (A) NEGATIVE   RBC / HPF 0-5 0 - 5 RBC/hpf   WBC, UA 6-10 0 - 5 WBC/hpf    Bacteria, UA RARE (A) NONE SEEN   Squamous Epithelial / LPF 0-5 0 - 5   Mucus PRESENT     IMAGING No results found.  MAU COURSE Orders Placed This Encounter  Procedures  . Culture, OB Urine  . Urinalysis, Routine w reflex microscopic  . Discharge patient   No orders of the defined types were placed in this encounter.   MDM FHT present via doppler  RH positive  Vaginal swabs done at new ob earlier this morning & only positive for BV which was treated. Pt declines testing today.   U/a with some leuks. Patient without specific urinary complaints today. Will send urine for  culture.   Pelvic exam peformed - no blood, no abnormal discharge. Cervix closed.  ASSESSMENT 1. Abdominal pain during pregnancy in first trimester   2. [redacted] weeks gestation of pregnancy   3. Vaginal spotting     PLAN Discharge home in stable condition. Bleeding precautions Urine culture pending  Allergies as of 06/05/2019   No Known Allergies     Medication List    STOP taking these medications   metroNIDAZOLE 500 MG tablet Commonly known as: FLAGYL     TAKE these medications   aspirin EC 81 MG tablet Take 1 tablet (81 mg total) by mouth daily. Take after 12 weeks for prevention of preeclampsia later in pregnancy. Start taking after 05/31/2019   Blood Pressure Kit Devi 1 kit by Does not apply route as needed.   Prenate Mini 18-0.6-0.4-350 MG Caps Take 1 tablet by mouth daily.   Vitafol FE+ 90-0.6-0.4-200 MG Caps Take 1 tablet by mouth daily.        Jorje Guild, NP 06/05/2019  4:06 PM

## 2019-06-06 LAB — CULTURE, OB URINE

## 2019-06-11 ENCOUNTER — Encounter: Payer: Self-pay | Admitting: Certified Nurse Midwife

## 2019-06-11 DIAGNOSIS — D563 Thalassemia minor: Secondary | ICD-10-CM | POA: Insufficient documentation

## 2019-06-11 HISTORY — DX: Thalassemia minor: D56.3

## 2019-06-13 ENCOUNTER — Encounter: Payer: Self-pay | Admitting: Certified Nurse Midwife

## 2019-06-21 ENCOUNTER — Ambulatory Visit (INDEPENDENT_AMBULATORY_CARE_PROVIDER_SITE_OTHER): Payer: Medicaid Other | Admitting: Nurse Practitioner

## 2019-06-21 ENCOUNTER — Telehealth (INDEPENDENT_AMBULATORY_CARE_PROVIDER_SITE_OTHER): Payer: Medicaid Other | Admitting: Nurse Practitioner

## 2019-06-21 ENCOUNTER — Other Ambulatory Visit: Payer: Self-pay

## 2019-06-21 ENCOUNTER — Encounter: Payer: Self-pay | Admitting: Nurse Practitioner

## 2019-06-21 VITALS — BP 136/82 | HR 99 | Wt 250.0 lb

## 2019-06-21 DIAGNOSIS — O99210 Obesity complicating pregnancy, unspecified trimester: Secondary | ICD-10-CM

## 2019-06-21 DIAGNOSIS — R030 Elevated blood-pressure reading, without diagnosis of hypertension: Secondary | ICD-10-CM

## 2019-06-21 DIAGNOSIS — Z3480 Encounter for supervision of other normal pregnancy, unspecified trimester: Secondary | ICD-10-CM

## 2019-06-21 DIAGNOSIS — Z8632 Personal history of gestational diabetes: Secondary | ICD-10-CM

## 2019-06-21 DIAGNOSIS — O162 Unspecified maternal hypertension, second trimester: Secondary | ICD-10-CM

## 2019-06-21 DIAGNOSIS — Z3A15 15 weeks gestation of pregnancy: Secondary | ICD-10-CM

## 2019-06-21 DIAGNOSIS — E669 Obesity, unspecified: Secondary | ICD-10-CM

## 2019-06-21 DIAGNOSIS — D563 Thalassemia minor: Secondary | ICD-10-CM

## 2019-06-21 DIAGNOSIS — O09299 Supervision of pregnancy with other poor reproductive or obstetric history, unspecified trimester: Secondary | ICD-10-CM

## 2019-06-21 NOTE — Progress Notes (Signed)
Virtual ROB   CC: pelvic pressure   Pt able to check B/P while on phone   B/P 146/89  P: 86  Pt denies HA's today note feeling dizzy Monday pt notes it was difficult to walk around only relief was to lay down . Denies any visual changes.

## 2019-06-21 NOTE — Progress Notes (Signed)
    Virtual visit at 10:30 am with elevated BP so advised to come to the office this afternoon for in person visit.  Subjective:  Sarah Mayo is a 29 y.o. G3P2002 at [redacted]w[redacted]d being seen today for ongoing prenatal care.  She is currently monitored for the following issues for this high-risk pregnancy and has BMI 37.0-37.9, adult; Sickle cell trait (HCC); Hemoglobin C trait (HCC); History of marijuana use; Supervision of other normal pregnancy, antepartum; Obesity during pregnancy, antepartum; Hx of gestational diabetes in prior pregnancy, currently pregnant; and Alpha thalassemia silent carrier on their problem list.  Patient reports no swelling but had a severe headache on sunday.  No headache today.  No RUQ pain and no edema..   .  .   . Denies leaking of fluid.   The following portions of the patient's history were reviewed and updated as appropriate: allergies, current medications, past family history, past medical history, past social history, past surgical history and problem list. Problem list updated.  Objective:   Vitals:   06/21/19 1310  BP: 136/82  Pulse: 99  Weight: 250 lb (113.4 kg)    Fetal Status: Fetal Heart Rate (bpm): 160 Fundal Height: 16 cm       General:  Alert, oriented and cooperative. Patient is in no acute distress.  Skin: Skin is warm and dry. No rash noted.   Cardiovascular: Normal heart rate noted  Respiratory: Normal respiratory effort, no problems with respiration noted  Abdomen: Soft, gravid, appropriate for gestational age.       Pelvic:  Cervical exam deferred        Extremities: Normal range of motion.     Mental Status: Normal mood and affect. Normal behavior. Normal judgment and thought content.   Urinalysis:      Assessment and Plan:  Pregnancy: G3P2002 at [redacted]w[redacted]d  1. Supervision of other normal pregnancy, antepartum Went to [facility] for abdominal pain in March and likely was round ligament pain.  2. Obesity during pregnancy, antepartum Hx of  gestational diabetes Taking baby aspirin  3. Elevated BP without diagnosis of hypertension Her BP cuff is reading higher than cuff in the office.  Needs a large cuff.  Explained to client.  Reviewed process for taking Had elevated BP at new OB.  If another elevated BP in the office before 20 weeks, then can be diagnosed as chronic hypertension.  Reviewed Plan of Care with Dr. Jolayne Panther.  Would start labetalol if systolic in 150s.  BP seemingly is gradually increasing but not yet over threshold of 140/90 in readings taken in the office.  Preterm labor symptoms and general obstetric precautions including but not limited to vaginal bleeding, contractions, leaking of fluid and fetal movement were reviewed in detail with the patient. Please refer to After Visit Summary for other counseling recommendations.  Return in about 2 weeks (around 07/05/2019) for in person ROB - BP check.  Nolene Bernheim, RN, MSN, NP-BC Nurse Practitioner, [facility] for [employer], [facility], [facility] 06/21/2019 1:42 PM

## 2019-06-21 NOTE — Progress Notes (Signed)
Blood Pressure with pt devic  B/P : 143/84  P: 106  Pt notes everytime she checks B/P at home it is in the 140's.

## 2019-06-21 NOTE — Progress Notes (Signed)
Davene D Rojero 29 y.o. [redacted]w[redacted]d BP elevated with home cuff.  Had a headache on Monday.  Advised to come in for an inperson visit at 1 pm today.  To bring BP cuff with her to visit so we can verify the readings her cuff is providing.  Nolene Bernheim, RN, MSN, NP-BC Nurse Practitioner, [facility] for [employer], [facility] 06/21/2019 7:16 PM

## 2019-06-22 LAB — COMPREHENSIVE METABOLIC PANEL
ALT: 18 IU/L (ref 0–32)
AST: 11 IU/L (ref 0–40)
Albumin/Globulin Ratio: 1.2 (ref 1.2–2.2)
Albumin: 3.8 g/dL — ABNORMAL LOW (ref 3.9–5.0)
Alkaline Phosphatase: 71 IU/L (ref 39–117)
BUN/Creatinine Ratio: 14 (ref 9–23)
BUN: 7 mg/dL (ref 6–20)
Bilirubin Total: <0.2 mg/dL (ref 0.0–1.2)
CO2: 23 mmol/L (ref 20–29)
Calcium: 9.4 mg/dL (ref 8.7–10.2)
Chloride: 102 mmol/L (ref 96–106)
Creatinine, Ser: 0.49 mg/dL — ABNORMAL LOW (ref 0.57–1.00)
GFR calc Af Amer: 154 mL/min/{1.73_m2} (ref >59)
GFR calc non Af Amer: 134 mL/min/{1.73_m2} (ref >59)
Globulin, Total: 3.2 g/dL (ref 1.5–4.5)
Glucose: 110 mg/dL — ABNORMAL HIGH (ref 65–99)
Potassium: 4.1 mmol/L (ref 3.5–5.2)
Sodium: 137 mmol/L (ref 134–144)
Total Protein: 7 g/dL (ref 6.0–8.5)

## 2019-06-22 LAB — CBC
Hematocrit: 36.7 % (ref 34.0–46.6)
Hemoglobin: 12.2 g/dL (ref 11.1–15.9)
MCH: 27.5 pg (ref 26.6–33.0)
MCHC: 33.2 g/dL (ref 31.5–35.7)
MCV: 83 fL (ref 79–97)
Platelets: 305 10*3/uL (ref 150–450)
RBC: 4.43 x10E6/uL (ref 3.77–5.28)
RDW: 13.6 % (ref 11.7–15.4)
WBC: 14 10*3/uL — ABNORMAL HIGH (ref 3.4–10.8)

## 2019-06-22 LAB — PROTEIN / CREATININE RATIO, URINE
Creatinine, Urine: 166.1 mg/dL
Protein, Ur: 28.3 mg/dL
Protein/Creat Ratio: 170 mg/g creat (ref 0–200)

## 2019-07-06 ENCOUNTER — Encounter: Payer: Medicaid Other | Admitting: Obstetrics and Gynecology

## 2019-07-12 ENCOUNTER — Ambulatory Visit (INDEPENDENT_AMBULATORY_CARE_PROVIDER_SITE_OTHER): Payer: Medicaid Other | Admitting: Obstetrics

## 2019-07-12 ENCOUNTER — Encounter: Payer: Self-pay | Admitting: Obstetrics

## 2019-07-12 ENCOUNTER — Other Ambulatory Visit: Payer: Self-pay

## 2019-07-12 VITALS — BP 127/73 | HR 91 | Wt 258.0 lb

## 2019-07-12 DIAGNOSIS — Z3480 Encounter for supervision of other normal pregnancy, unspecified trimester: Secondary | ICD-10-CM | POA: Diagnosis not present

## 2019-07-12 DIAGNOSIS — Z3A18 18 weeks gestation of pregnancy: Secondary | ICD-10-CM

## 2019-07-12 DIAGNOSIS — E669 Obesity, unspecified: Secondary | ICD-10-CM

## 2019-07-12 DIAGNOSIS — O99212 Obesity complicating pregnancy, second trimester: Secondary | ICD-10-CM

## 2019-07-12 DIAGNOSIS — O99210 Obesity complicating pregnancy, unspecified trimester: Secondary | ICD-10-CM

## 2019-07-12 NOTE — Progress Notes (Signed)
Patient reports that she started feeling fetal movement, denies pain.

## 2019-07-12 NOTE — Progress Notes (Signed)
Subjective:  Sarah Mayo is a 29 y.o. G3P2002 at [redacted]w[redacted]d being seen today for ongoing prenatal care.  She is currently monitored for the following issues for this low-risk pregnancy and has BMI 37.0-37.9, adult; Sickle cell trait (HCC); Hemoglobin C trait (HCC); History of marijuana use; Supervision of other normal pregnancy, antepartum; Obesity during pregnancy, antepartum; Hx of gestational diabetes in prior pregnancy, currently pregnant; and Alpha thalassemia silent carrier on their problem list.  Patient reports no complaints.  Contractions: Not present. Vag. Bleeding: None.  Movement: Present. Denies leaking of fluid.   The following portions of the patient's history were reviewed and updated as appropriate: allergies, current medications, past family history, past medical history, past social history, past surgical history and problem list. Problem list updated.  Objective:   Vitals:   07/12/19 0822  BP: 127/73  Pulse: 91  Weight: 258 lb (117 kg)    Fetal Status:     Movement: Present     General:  Alert, oriented and cooperative. Patient is in no acute distress.  Skin: Skin is warm and dry. No rash noted.   Cardiovascular: Normal heart rate noted  Respiratory: Normal respiratory effort, no problems with respiration noted  Abdomen: Soft, gravid, appropriate for gestational age. Pain/Pressure: Absent     Pelvic:  Cervical exam deferred        Extremities: Normal range of motion.  Edema: None  Mental Status: Normal mood and affect. Normal behavior. Normal judgment and thought content.   Urinalysis:      Assessment and Plan:  Pregnancy: G3P2002 at [redacted]w[redacted]d  1. Supervision of other normal pregnancy, antepartum Rx: - AFP, Serum, Open Spina Bifida  2. Obesity during pregnancy, antepartum   Preterm labor symptoms and general obstetric precautions including but not limited to vaginal bleeding, contractions, leaking of fluid and fetal movement were reviewed in detail with the  patient. Please refer to After Visit Summary for other counseling recommendations.   Return in about 4 weeks (around 08/09/2019) for MyChart.   Brock Bad, MD  07/12/2019

## 2019-07-15 LAB — AFP, SERUM, OPEN SPINA BIFIDA
AFP MoM: 1.02
AFP Value: 36.4 ng/mL
Gest. Age on Collection Date: 18 weeks
Maternal Age At EDD: 27.6 yr
OSBR Risk 1 IN: 10000
Test Results:: NEGATIVE
Weight: 258 [lb_av]

## 2019-07-17 ENCOUNTER — Other Ambulatory Visit (HOSPITAL_COMMUNITY): Payer: Self-pay | Admitting: *Deleted

## 2019-07-17 ENCOUNTER — Encounter (HOSPITAL_COMMUNITY): Payer: Self-pay

## 2019-07-17 ENCOUNTER — Ambulatory Visit (HOSPITAL_COMMUNITY): Payer: Medicaid Other | Admitting: *Deleted

## 2019-07-17 ENCOUNTER — Other Ambulatory Visit: Payer: Self-pay

## 2019-07-17 ENCOUNTER — Ambulatory Visit (HOSPITAL_COMMUNITY)
Admission: RE | Admit: 2019-07-17 | Discharge: 2019-07-17 | Disposition: A | Discharge status: Home / Self Care | Payer: Medicaid Other | Source: Ambulatory Visit | Attending: Certified Nurse Midwife | Admitting: Certified Nurse Midwife

## 2019-07-17 DIAGNOSIS — E669 Obesity, unspecified: Secondary | ICD-10-CM | POA: Diagnosis not present

## 2019-07-17 DIAGNOSIS — Z363 Encounter for antenatal screening for malformations: Secondary | ICD-10-CM | POA: Diagnosis not present

## 2019-07-17 DIAGNOSIS — O09292 Supervision of pregnancy with other poor reproductive or obstetric history, second trimester: Secondary | ICD-10-CM | POA: Diagnosis not present

## 2019-07-17 DIAGNOSIS — Z8632 Personal history of gestational diabetes: Secondary | ICD-10-CM | POA: Insufficient documentation

## 2019-07-17 DIAGNOSIS — Z148 Genetic carrier of other disease: Secondary | ICD-10-CM | POA: Diagnosis not present

## 2019-07-17 DIAGNOSIS — O99210 Obesity complicating pregnancy, unspecified trimester: Secondary | ICD-10-CM | POA: Diagnosis not present

## 2019-07-17 DIAGNOSIS — O99212 Obesity complicating pregnancy, second trimester: Secondary | ICD-10-CM

## 2019-07-17 DIAGNOSIS — D563 Thalassemia minor: Secondary | ICD-10-CM | POA: Diagnosis not present

## 2019-07-17 DIAGNOSIS — Z862 Personal history of diseases of the blood and blood-forming organs and certain disorders involving the immune mechanism: Secondary | ICD-10-CM | POA: Diagnosis not present

## 2019-07-17 DIAGNOSIS — Z3480 Encounter for supervision of other normal pregnancy, unspecified trimester: Secondary | ICD-10-CM

## 2019-07-17 DIAGNOSIS — Z3A19 19 weeks gestation of pregnancy: Secondary | ICD-10-CM

## 2019-07-17 DIAGNOSIS — Z362 Encounter for other antenatal screening follow-up: Secondary | ICD-10-CM

## 2019-07-17 DIAGNOSIS — O09299 Supervision of pregnancy with other poor reproductive or obstetric history, unspecified trimester: Secondary | ICD-10-CM

## 2019-08-09 ENCOUNTER — Telehealth (INDEPENDENT_AMBULATORY_CARE_PROVIDER_SITE_OTHER): Payer: Medicaid Other

## 2019-08-09 VITALS — BP 114/67

## 2019-08-09 DIAGNOSIS — O99210 Obesity complicating pregnancy, unspecified trimester: Secondary | ICD-10-CM

## 2019-08-09 DIAGNOSIS — O99213 Obesity complicating pregnancy, third trimester: Secondary | ICD-10-CM

## 2019-08-09 DIAGNOSIS — Z8632 Personal history of gestational diabetes: Secondary | ICD-10-CM

## 2019-08-09 DIAGNOSIS — O09293 Supervision of pregnancy with other poor reproductive or obstetric history, third trimester: Secondary | ICD-10-CM

## 2019-08-09 DIAGNOSIS — Z3A22 22 weeks gestation of pregnancy: Secondary | ICD-10-CM

## 2019-08-09 DIAGNOSIS — O09299 Supervision of pregnancy with other poor reproductive or obstetric history, unspecified trimester: Secondary | ICD-10-CM

## 2019-08-09 DIAGNOSIS — Z3480 Encounter for supervision of other normal pregnancy, unspecified trimester: Secondary | ICD-10-CM

## 2019-08-09 DIAGNOSIS — D563 Thalassemia minor: Secondary | ICD-10-CM

## 2019-08-09 NOTE — Progress Notes (Signed)
OBSTETRICS PRENATAL VIRTUAL VISIT ENCOUNTER NOTE  Provider location: [facility] [place]   I connected with Sarah Mayo on 08/09/19 at  9:35 AM EDT by MyChart Video Encounter at home and verified that I am speaking with the correct person using two identifiers.   I discussed the limitations, risks, security and privacy concerns of performing an evaluation and management service virtually and the availability of in person appointments. I also discussed with the patient that there may be a patient responsible charge related to this service. The patient expressed understanding and agreed to proceed. Subjective:  Sarah Mayo is a 29 y.o. G3P2002 at [redacted]w[redacted]d being seen today for ongoing prenatal care.  She is currently monitored for the following issues for this high-risk pregnancy and has BMI 37.0-37.9, adult; Sickle cell trait (HCC); Hemoglobin C trait (HCC); History of marijuana use; Supervision of other normal pregnancy, antepartum; Obesity during pregnancy, antepartum; Hx of gestational diabetes in prior pregnancy, currently pregnant; and Alpha thalassemia silent carrier on their problem list.  Patient reports no complaints. Patient questions what the options would be if baby remains breech. Patient denies abdominal cramping or pain.  Contractions: Not present. Vag. Bleeding: None.  Movement: Present. Denies any leaking of fluid.   The following portions of the patient's history were reviewed and updated as appropriate: allergies, current medications, past family history, past medical history, past social history, past surgical history and problem list.   Objective:   Vitals:   08/09/19 0852  BP: 114/67    Fetal Status:     Movement: Present     General:  Alert, oriented and cooperative. Patient is in no acute distress.  Respiratory: Normal respiratory effort, no problems with respiration noted  Mental Status: Normal mood and affect. Normal behavior. Normal  judgment and thought content.  Rest of physical exam deferred due to type of encounter  Imaging: [place] MFM OB DETAIL +14 WK  Result Date: 07/17/2019 ----------------------------------------------------------------------  OBSTETRICS REPORT                       (Signed Final 07/17/2019 03:43 pm) ---------------------------------------------------------------------- Patient Info  ID #:       [id number]                          D.O.B.:  06-Jan-1991 (29 yrs)  Name:       Sarah Mayo              Visit Date: 07/17/2019 10:35 am ---------------------------------------------------------------------- Performed By  Performed By:     Eden Lathe BS      Ref. Address:     [address]                    RDMS RVT=                                                             [address]  [address]                                                             [place]  Attending:        Johnell Comings MD         [facility]  Referred By:      [place] ---------------------------------------------------------------------- Orders   #  Description                          Code         Ordered By   1  [place] MFM OB DETAIL +14 WK              76811.01     Darrol Poke  ----------------------------------------------------------------------   #  Order #                    Accession #                 Episode #   1  [id number]                  [id number]                  [id number]  ---------------------------------------------------------------------- Indications   Antenatal screening for malformations (low     Z36.3   risk NIPS, neg AFP)   Obesity complicating pregnancy, second         O99.212   trimester   [redacted] weeks gestation of pregnancy                Z3A.19   History of sickle cell trait                   Z86.2    Genetic carrier (hemoglobin C/silent alpha     Z14.8   thal)   Poor obstetric history: Previous gestational   O09.299   diabetes  ---------------------------------------------------------------------- Fetal Evaluation  Num Of Fetuses:         1  Fetal Heart Rate(bpm):  164  Cardiac Activity:       Observed  Presentation:           Breech  Placenta:               Posterior  P. Cord Insertion:      Visualized  Amniotic Fluid  AFI FV:      Within normal limits  Largest Pocket(cm)                              5.92 ---------------------------------------------------------------------- Biometry  BPD:      48.1  mm     G. Age:  20w 4d         96  %    CI:        76.52   %    70 - 86                                                          FL/HC:      18.5   %    16.1 - 18.3  HC:      174.2  mm     G. Age:  20w 0d         84  %    HC/AC:      1.12        1.09 - 1.39  AC:      156.2  mm     G. Age:  20w 5d         93  %    FL/BPD:     66.9   %  FL:       32.2  mm     G. Age:  20w 0d         79  %    FL/AC:      20.6   %    20 - 24  HUM:      30.5  mm     G. Age:  20w 1d         78  %  CER:      20.5  mm     G. Age:  19w 4d         61  %  NFT:         4  mm  CM:        5.2  mm  Est. FW:     351  gm    0 lb 12 oz      99  % ---------------------------------------------------------------------- OB History  Gravidity:    3         Term:   2        Prem:   0        SAB:   0  TOP:          0       Ectopic:  0        Living: 2 ---------------------------------------------------------------------- Gestational Age  LMP:           19w 5d        Date:  03/01/19                 EDD:   12/06/19  U/S Today:     20w 2d                                        EDD:   12/02/19  Best:          19w 0d     Det.  By:  Marcella Dubs         EDD:   12/11/19                                      (04/23/19) ---------------------------------------------------------------------- Anatomy  Cranium:               Appears normal          LVOT:                   Appears normal  Cavum:                 Appears normal         Aortic Arch:            Appears normal  Ventricles:            Appears normal         Ductal Arch:            Appears normal  Choroid Plexus:        Appears normal         Diaphragm:              Appears normal  Cerebellum:            Appears normal         Stomach:                Appears normal, left                                                                        sided  Posterior Fossa:       Appears normal         Abdomen:                Appears normal  Nuchal Fold:           Appears normal         Abdominal Wall:         Appears nml (cord                                                                        insert, abd wall)  Face:                  Appears normal         Cord Vessels:           Appears normal (3                         (orbits and profile)                           vessel cord)  Lips:  Appears normal         Kidneys:                Bilateral UTD RT 7                                                                        mm LT 5.6 mm  Palate:                Appears normal         Bladder:                Appears normal  Thoracic:              Appears normal         Spine:                  Appears normal  Heart:                 Appears normal         Upper Extremities:      Appears normal                         (4CH, axis, and                         situs)  RVOT:                  Appears normal         Lower Extremities:      Appears normal  Other:  Heels/feet and open hands/5th digits visualized. Nasal bone          visualized. ---------------------------------------------------------------------- Cervix Uterus Adnexa  Cervix  Length:           3.59  cm.  Normal appearance by transabdominal scan.  Uterus  No abnormality visualized.  Left Ovary  Within normal limits.  Right Ovary  Within normal limits.  Cul De Sac  No free fluid seen.  Adnexa  No abnormality visualized.  ---------------------------------------------------------------------- Comments  This patient was seen for a detailed fetal anatomy scan due  to maternal obesity.  She denies any significant past medical history and denies  any problems in her current pregnancy.  She had a cell free DNA test earlier in her pregnancy which  indicated a low risk for trisomy 45, 53, and 13. A female fetus is  predicted.  She was informed that the fetal growth and amniotic fluid  level were appropriate for her gestational age.  Mild bilateral pyelectasis measuring 0.6 to 0.7 cm dilated was  noted on today's ultrasound exam.  The patient was advised  that the mild bilateral pylectasis noted today is most likely a  normal variant.  However, there may also be other processes  causing this finding that may require treatment after birth.  The association between pyelectasis and Down syndrome  was discussed.  Due to this finding, the patient was advised  regarding the increased risk of Down syndrome.  She was  offered and declined an amniocentesis today for definitive  diagnosis of fetal aneuploidy.  The patient reports that her  daughter was also  found to have bilateral pyelectasis during  her prenatal ultrasounds.  The pyelectasis resolved after birth  and therefore she is less concerned regarding this finding.  She was advised that we will continue to follow her closely  throughout her pregnancy to assess this finding.  The patient was informed that anomalies may be missed due  to technical limitations. If the fetus is in a suboptimal position  or maternal habitus is increased, visualization of the fetus in  the maternal uterus may be impaired.  A follow-up exam was scheduled in 4 weeks for follow-up of  the mild bilateral pyelectasis noted today. ----------------------------------------------------------------------                   Ma RingsVictor Fang, MD Electronically Signed Final Report   07/17/2019 03:43 pm  ----------------------------------------------------------------------   Assessment and Plan:  Pregnancy: [id number] at 3833w2d 1. Supervision of other normal pregnancy, antepartum -Reassured that it is normal for variations in fetal presentation in early pregnancy. -Reviewed potential options if fetus remains breeched, but informed that infant has time to turn. -Anticipatory for upcoming appts. -Reviewed Glucola appt preparation including fasting the night before and morning of.   -Discussed anticipated office time of 2.5-3 hours.  -Reviewed blood draw procedures and labs which also include check of iron level.  -Discussed how results of GTT are handled including diabetic education and BS testing for abnormal results and routine care for normal results.   2. Obesity during pregnancy, antepartum -Has not taken weight. -Patient states she has improved her nutritional intake greatly. -States she is craving salads and bacon, but eats bacon only in salad. -Drinks plenty of water.   3. Hx of gestational diabetes in prior pregnancy, currently pregnant -GTT at [facility] -Taking baby aspirin  4. Alpha thalassemia silent carrier -CBC at [facility]  Preterm labor symptoms and general obstetric precautions including but not limited to vaginal bleeding, contractions, leaking of fluid and fetal movement were reviewed in detail with the patient. I discussed the assessment and treatment plan with the patient. The patient was provided an opportunity to ask questions and all were answered. The patient agreed with the plan and demonstrated an understanding of the instructions. The patient was advised to call back or seek an in-person office evaluation/[facility] for any urgent or concerning symptoms. Please refer to After Visit Summary for other counseling recommendations.   I provided 8 minutes of face-to-face time during this encounter.  Return in about 5 weeks (around 09/13/2019) for  HR-ROB with GTT.  Future Appointments  Date Time Provider Department Center  08/14/2019  8:45 AM [facility]  08/14/2019  8:45 AM [facility] US4 [facility]    Cherre RobinsJessica L Najma Bozarth, CNM [facility], [facility]

## 2019-08-09 NOTE — Progress Notes (Signed)
Virtual Visit via Telephone Note  I connected with Sarah Mayo on 08/09/19 at  9:35 AM EDT by telephone and verified that I am speaking with the correct person using two identifiers.  No complaints today per pt

## 2019-08-14 ENCOUNTER — Ambulatory Visit: Payer: Medicaid Other | Admitting: *Deleted

## 2019-08-14 ENCOUNTER — Other Ambulatory Visit: Payer: Self-pay

## 2019-08-14 ENCOUNTER — Other Ambulatory Visit: Payer: Self-pay | Admitting: *Deleted

## 2019-08-14 ENCOUNTER — Ambulatory Visit (HOSPITAL_COMMUNITY): Payer: Medicaid Other | Attending: Obstetrics

## 2019-08-14 DIAGNOSIS — Z8632 Personal history of gestational diabetes: Secondary | ICD-10-CM

## 2019-08-14 DIAGNOSIS — O99210 Obesity complicating pregnancy, unspecified trimester: Secondary | ICD-10-CM

## 2019-08-14 DIAGNOSIS — O09299 Supervision of pregnancy with other poor reproductive or obstetric history, unspecified trimester: Secondary | ICD-10-CM | POA: Insufficient documentation

## 2019-08-14 DIAGNOSIS — D563 Thalassemia minor: Secondary | ICD-10-CM

## 2019-08-14 DIAGNOSIS — Z3A23 23 weeks gestation of pregnancy: Secondary | ICD-10-CM | POA: Diagnosis not present

## 2019-08-14 DIAGNOSIS — Z148 Genetic carrier of other disease: Secondary | ICD-10-CM

## 2019-08-14 DIAGNOSIS — O99212 Obesity complicating pregnancy, second trimester: Secondary | ICD-10-CM

## 2019-08-14 DIAGNOSIS — Z362 Encounter for other antenatal screening follow-up: Secondary | ICD-10-CM | POA: Diagnosis not present

## 2019-08-14 DIAGNOSIS — O358XX0 Maternal care for other (suspected) fetal abnormality and damage, not applicable or unspecified: Secondary | ICD-10-CM

## 2019-08-14 DIAGNOSIS — E669 Obesity, unspecified: Secondary | ICD-10-CM | POA: Diagnosis not present

## 2019-08-14 DIAGNOSIS — O09292 Supervision of pregnancy with other poor reproductive or obstetric history, second trimester: Secondary | ICD-10-CM | POA: Diagnosis not present

## 2019-08-14 DIAGNOSIS — Z862 Personal history of diseases of the blood and blood-forming organs and certain disorders involving the immune mechanism: Secondary | ICD-10-CM | POA: Diagnosis not present

## 2019-08-14 DIAGNOSIS — O35EXX0 Maternal care for other (suspected) fetal abnormality and damage, fetal genitourinary anomalies, not applicable or unspecified: Secondary | ICD-10-CM

## 2019-08-30 ENCOUNTER — Inpatient Hospital Stay (HOSPITAL_COMMUNITY)
Admission: AD | Admit: 2019-08-30 | Discharge: 2019-08-30 | Disposition: A | Discharge status: Home / Self Care | Payer: Medicaid Other | Attending: Obstetrics and Gynecology | Admitting: Obstetrics and Gynecology

## 2019-08-30 ENCOUNTER — Other Ambulatory Visit: Payer: Self-pay

## 2019-08-30 ENCOUNTER — Encounter (HOSPITAL_COMMUNITY): Payer: Self-pay | Admitting: Obstetrics and Gynecology

## 2019-08-30 DIAGNOSIS — O99012 Anemia complicating pregnancy, second trimester: Secondary | ICD-10-CM | POA: Diagnosis not present

## 2019-08-30 DIAGNOSIS — R109 Unspecified abdominal pain: Secondary | ICD-10-CM | POA: Diagnosis present

## 2019-08-30 DIAGNOSIS — Z8632 Personal history of gestational diabetes: Secondary | ICD-10-CM | POA: Insufficient documentation

## 2019-08-30 DIAGNOSIS — Z3A25 25 weeks gestation of pregnancy: Secondary | ICD-10-CM | POA: Insufficient documentation

## 2019-08-30 DIAGNOSIS — R102 Pelvic and perineal pain: Secondary | ICD-10-CM | POA: Diagnosis not present

## 2019-08-30 DIAGNOSIS — N949 Unspecified condition associated with female genital organs and menstrual cycle: Secondary | ICD-10-CM

## 2019-08-30 DIAGNOSIS — D573 Sickle-cell trait: Secondary | ICD-10-CM | POA: Insufficient documentation

## 2019-08-30 DIAGNOSIS — O26892 Other specified pregnancy related conditions, second trimester: Secondary | ICD-10-CM

## 2019-08-30 DIAGNOSIS — Z3689 Encounter for other specified antenatal screening: Secondary | ICD-10-CM | POA: Diagnosis not present

## 2019-08-30 LAB — URINALYSIS, ROUTINE W REFLEX MICROSCOPIC
Bacteria, UA: NONE SEEN
Bilirubin Urine: NEGATIVE
Glucose, UA: 150 mg/dL — AB
Hgb urine dipstick: NEGATIVE
Ketones, ur: NEGATIVE mg/dL
Nitrite: NEGATIVE
Protein, ur: NEGATIVE mg/dL
Specific Gravity, Urine: 1.029 (ref 1.005–1.030)
pH: 6 (ref 5.0–8.0)

## 2019-08-30 LAB — WET PREP, GENITAL
Clue Cells Wet Prep HPF POC: NONE SEEN
Sperm: NONE SEEN
Trich, Wet Prep: NONE SEEN
Yeast Wet Prep HPF POC: NONE SEEN

## 2019-08-30 MED ORDER — COMFORT FIT MATERNITY SUPP SM MISC
1.0000 [IU] | Freq: Every day | 0 refills | Status: DC | PRN
Start: 2019-08-30 — End: 2019-11-23

## 2019-08-30 NOTE — Discharge Instructions (Signed)
PREGNANCY SUPPORT BELT: You are not alone, Seventy-five percent of women have some sort of abdominal or back pain at some point in their pregnancy. Your baby is growing at a fast pace, which means that your whole body is rapidly trying to adjust to the changes. As your uterus grows, your back may start feeling a bit under stress and this can result in back or abdominal pain that can go from mild, and therefore bearable, to severe pains that will not allow you to sit or lay down comfortably, When it comes to dealing with pregnancy-related pains and cramps, some pregnant women usually prefer natural remedies, which the market is filled with nowadays. For example, wearing a pregnancy support belt can help ease and lessen your discomfort and pain. WHAT ARE THE BENEFITS OF WEARING A PREGNANCY SUPPORT BELT? A pregnancy support belt provides support to the lower portion of the belly taking some of the weight of the growing uterus and distributing to the other parts of your body. It is designed make you comfortable and gives you extra support. Over the years, the pregnancy apparel market has been studying the needs and wants of pregnant women and they have come up with the most comfortable pregnancy support belts that woman could ever ask for. In fact, you will no longer have to wear a stretched-out or bulky pregnancy belt that is visible underneath your clothes and makes you feel even more uncomfortable. Nowadays, a pregnancy support belt is made of comfortable and stretchy materials that will not irritate your skin but will actually make you feel at ease and you will not even notice you are wearing it. They are easy to put on and adjust during the day and can be worn at night for additional support.  BENEFITS: . Relives Back pain . Relieves Abdominal Muscle and Leg Pain . Stabilizes the Pelvic Ring . Offers a Cushioned Abdominal Lift Pad . Relieves pressure on the Sciatic Nerve Within Minutes WHERE TO GET  YOUR PREGNANCY BELT: [employer] [phone] @[id number]  [address] [place], [place]        Round Ligament Pain  The round ligament is a cord of muscle and tissue that helps support the uterus. It can become a source of pain during pregnancy if it becomes stretched or twisted as the baby grows. The pain usually begins in the second trimester (13-28 weeks) of pregnancy, and it can come and go until the baby is delivered. It is not a serious problem, and it does not cause harm to the baby. Round ligament pain is usually a short, sharp, and pinching pain, but it can also be a dull, lingering, and aching pain. The pain is felt in the lower side of the abdomen or in the groin. It usually starts deep in the groin and moves up to the outside of the hip area. The pain may occur when you:  Suddenly change position, such as quickly going from a sitting to standing position.  Roll over in bed.  Cough or sneeze.  Do physical activity. Follow these instructions at home:   Watch your condition for any changes.  When the pain starts, relax. Then try any of these methods to help with the pain: ? Sitting down. ? Flexing your knees up to your abdomen. ? Lying on your side with one pillow under your abdomen and another pillow between your legs. ? Sitting in a warm bath for 15-20 minutes or until the pain goes away.  Take over-the-counter and prescription medicines only as told by your health care provider.  Move slowly when you sit down or stand up.  Avoid long walks if they cause pain.  Stop or reduce your physical activities if they cause pain.  Keep all follow-up visits as told by your health care provider. This is important. Contact a health care provider if:  Your pain does not go away with treatment.  You feel pain in your back that you did not have before.  Your medicine is not helping. Get help right away if:  You have a fever or chills.  You develop  uterine contractions.  You have vaginal bleeding.  You have nausea or vomiting.  You have diarrhea.  You have pain when you urinate. Summary  Round ligament pain is felt in the lower abdomen or groin. It is usually a short, sharp, and pinching pain. It can also be a dull, lingering, and aching pain.  This pain usually begins in the second trimester (13-28 weeks). It occurs because the uterus is stretching with the growing baby, and it is not harmful to the baby.  You may notice the pain when you suddenly change position, when you cough or sneeze, or during physical activity.  Relaxing, flexing your knees to your abdomen, lying on one side, or taking a warm bath may help to get rid of the pain.  Get help from your health care provider if the pain does not go away or if you have vaginal bleeding, nausea, vomiting, diarrhea, or painful urination. This information is not intended to replace advice given to you by your health care provider. Make sure you discuss any questions you have with your health care provider. Document Revised: 08/24/2017 Document Reviewed: 08/24/2017 Elsevier Patient Education  2020 [employer].        Second Trimester of Pregnancy The second trimester is from week 14 through week 27 (months 4 through 6). The second trimester is often a time when you feel your best. Your body has adjusted to being pregnant, and you begin to feel better physically. Usually, morning sickness has lessened or quit completely, you may have more energy, and you may have an increase in appetite. The second trimester is also a time when the fetus is growing rapidly. At the end of the sixth month, the fetus is about 9 inches long and weighs about 1 pounds. You will likely begin to feel the baby move (quickening) between 16 and 20 weeks of pregnancy. Body changes during your second trimester Your body continues to go through many changes during your second trimester. The changes vary from  woman to woman.  Your weight will continue to increase. You will notice your lower abdomen bulging out.  You may begin to get stretch marks on your hips, abdomen, and breasts.  You may develop headaches that can be relieved by medicines. The medicines should be approved by your health care provider.  You may urinate more often because the fetus is pressing on your bladder.  You may develop or continue to have heartburn as a result of your pregnancy.  You may develop constipation because certain hormones are causing the muscles that push waste through your intestines to slow down.  You may develop hemorrhoids or swollen, bulging veins (varicose veins).  You may have back pain. This is caused by: ? Weight gain. ? Pregnancy hormones that are relaxing the joints in your pelvis. ? A shift in weight and the muscles that support your  balance.  Your breasts will continue to grow and they will continue to become tender.  Your gums may bleed and may be sensitive to brushing and flossing.  Dark spots or blotches (chloasma, mask of pregnancy) may develop on your face. This will likely fade after the baby is born.  A dark line from your belly button to the pubic area (linea nigra) may appear. This will likely fade after the baby is born.  You may have changes in your hair. These can include thickening of your hair, rapid growth, and changes in texture. Some women also have hair loss during or after pregnancy, or hair that feels dry or thin. Your hair will most likely return to normal after your baby is born. What to expect at prenatal visits During a routine prenatal visit:  You will be weighed to make sure you and the fetus are growing normally.  Your blood pressure will be taken.  Your abdomen will be measured to track your baby's growth.  The fetal heartbeat will be listened to.  Any test results from the previous visit will be discussed. Your health care provider may ask you:  How  you are feeling.  If you are feeling the baby move.  If you have had any abnormal symptoms, such as leaking fluid, bleeding, severe headaches, or abdominal cramping.  If you are using any tobacco products, including cigarettes, chewing tobacco, and electronic cigarettes.  If you have any questions. Other tests that may be performed during your second trimester include:  Blood tests that check for: ? Low iron levels (anemia). ? High blood sugar that affects pregnant women (gestational diabetes) between 65 and 28 weeks. ? Rh antibodies. This is to check for a protein on red blood cells (Rh factor).  Urine tests to check for infections, diabetes, or protein in the urine.  An ultrasound to confirm the proper growth and development of the baby.  An amniocentesis to check for possible genetic problems.  Fetal screens for spina bifida and Down syndrome.  HIV (human immunodeficiency virus) testing. Routine prenatal testing includes screening for HIV, unless you choose not to have this test. Follow these instructions at home: Medicines  Follow your health care provider's instructions regarding medicine use. Specific medicines may be either safe or unsafe to take during pregnancy.  Take a prenatal vitamin that contains at least 600 micrograms (mcg) of folic acid.  If you develop constipation, try taking a stool softener if your health care provider approves. Eating and drinking   Eat a balanced diet that includes fresh fruits and vegetables, whole grains, good sources of protein such as meat, eggs, or tofu, and low-fat dairy. Your health care provider will help you determine the amount of weight gain that is right for you.  Avoid raw meat and uncooked cheese. These carry germs that can cause birth defects in the baby.  If you have low calcium intake from food, talk to your health care provider about whether you should take a daily calcium supplement.  Limit foods that are high in fat  and processed sugars, such as fried and sweet foods.  To prevent constipation: ? Drink enough fluid to keep your urine clear or pale yellow. ? Eat foods that are high in fiber, such as fresh fruits and vegetables, whole grains, and beans. Activity  Exercise only as directed by your health care provider. Most women can continue their usual exercise routine during pregnancy. Try to exercise for 30 minutes at least 5 days  a week. Stop exercising if you experience uterine contractions.  Avoid heavy lifting, wear low heel shoes, and practice good posture.  A sexual relationship may be continued unless your health care provider directs you otherwise. Relieving pain and discomfort  Wear a good support bra to prevent discomfort from breast tenderness.  Take warm sitz baths to soothe any pain or discomfort caused by hemorrhoids. Use hemorrhoid cream if your health care provider approves.  Rest with your legs elevated if you have leg cramps or low back pain.  If you develop varicose veins, wear support hose. Elevate your feet for 15 minutes, 3-4 times a day. Limit salt in your diet. Prenatal Care  Write down your questions. Take them to your prenatal visits.  Keep all your prenatal visits as told by your health care provider. This is important. Safety  Wear your seat belt at all times when driving.  Make a list of emergency phone numbers, including numbers for family, friends, the hospital, and police and fire departments. General instructions  Ask your health care provider for a referral to a local prenatal education class. Begin classes no later than the beginning of month 6 of your pregnancy.  Ask for help if you have counseling or nutritional needs during pregnancy. Your health care provider can offer advice or refer you to specialists for help with various needs.  Do not use hot tubs, steam rooms, or saunas.  Do not douche or use tampons or scented sanitary pads.  Do not cross  your legs for long periods of time.  Avoid cat litter boxes and soil used by cats. These carry germs that can cause birth defects in the baby and possibly loss of the fetus by miscarriage or stillbirth.  Avoid all smoking, herbs, alcohol, and unprescribed drugs. Chemicals in these products can affect the formation and growth of the baby.  Do not use any products that contain nicotine or tobacco, such as cigarettes and e-cigarettes. If you need help quitting, ask your health care provider.  Visit your dentist if you have not gone yet during your pregnancy. Use a soft toothbrush to brush your teeth and be gentle when you floss. Contact a health care provider if:  You have dizziness.  You have mild pelvic cramps, pelvic pressure, or nagging pain in the abdominal area.  You have persistent nausea, vomiting, or diarrhea.  You have a bad smelling vaginal discharge.  You have pain when you urinate. Get help right away if:  You have a fever.  You are leaking fluid from your vagina.  You have spotting or bleeding from your vagina.  You have severe abdominal cramping or pain.  You have rapid weight gain or weight loss.  You have shortness of breath with chest pain.  You notice sudden or extreme swelling of your face, hands, ankles, feet, or legs.  You have not felt your baby move in over an hour.  You have severe headaches that do not go away when you take medicine.  You have vision changes. Summary  The second trimester is from week 14 through week 27 (months 4 through 6). It is also a time when the fetus is growing rapidly.  Your body goes through many changes during pregnancy. The changes vary from woman to woman.  Avoid all smoking, herbs, alcohol, and unprescribed drugs. These chemicals affect the formation and growth your baby.  Do not use any tobacco products, such as cigarettes, chewing tobacco, and e-cigarettes. If you need help quitting,  ask your health care  provider.  Contact your health care provider if you have any questions. Keep all prenatal visits as told by your health care provider. This is important. This information is not intended to replace advice given to you by your health care provider. Make sure you discuss any questions you have with your health care provider. Document Revised: 06/30/2018 Document Reviewed: 04/13/2016 Elsevier Patient Education  [place] of Pregnancy The third trimester is from week 28 through week 40 (months 7 through 9). The third trimester is a time when the unborn baby (fetus) is growing rapidly. At the end of the ninth month, the fetus is about 20 inches in length and weighs 6-10 pounds. Body changes during your third trimester Your body will continue to go through many changes during pregnancy. The changes vary from woman to woman. During the third trimester:  Your weight will continue to increase. You can expect to gain 25-35 pounds (11-16 kg) by the end of the pregnancy.  You may begin to get stretch marks on your hips, abdomen, and breasts.  You may urinate more often because the fetus is moving lower into your pelvis and pressing on your bladder.  You may develop or continue to have heartburn. This is caused by increased hormones that slow down muscles in the digestive tract.  You may develop or continue to have constipation because increased hormones slow digestion and cause the muscles that push waste through your intestines to relax.  You may develop hemorrhoids. These are swollen veins (varicose veins) in the rectum that can itch or be painful.  You may develop swollen, bulging veins (varicose veins) in your legs.  You may have increased body aches in the pelvis, back, or thighs. This is due to weight gain and increased hormones that are relaxing your joints.  You may have changes in your hair. These can include thickening of your hair, rapid growth, and  changes in texture. Some women also have hair loss during or after pregnancy, or hair that feels dry or thin. Your hair will most likely return to normal after your baby is born.  Your breasts will continue to grow and they will continue to become tender. A yellow fluid (colostrum) may leak from your breasts. This is the first milk you are producing for your baby.  Your belly button may stick out.  You may notice more swelling in your hands, face, or ankles.  You may have increased tingling or numbness in your hands, arms, and legs. The skin on your belly may also feel numb.  You may feel short of breath because of your expanding uterus.  You may have more problems sleeping. This can be caused by the size of your belly, increased need to urinate, and an increase in your body's metabolism.  You may notice the fetus "dropping," or moving lower in your abdomen (lightening).  You may have increased vaginal discharge.  You may notice your joints feel loose and you may have pain around your pelvic bone. What to expect at prenatal visits You will have prenatal exams every 2 weeks until week 36. Then you will have weekly prenatal exams. During a routine prenatal visit:  You will be weighed to make sure you and the baby are growing normally.  Your blood pressure will be taken.  Your abdomen will be measured to track your baby's growth.  The fetal heartbeat will be listened to.  Any test results from the previous visit will be discussed.  You may have a cervical check near your due date to see if your cervix has softened or thinned (effaced).  You will be tested for Group B streptococcus. This happens between 35 and 37 weeks. Your health care provider may ask you:  What your birth plan is.  How you are feeling.  If you are feeling the baby move.  If you have had any abnormal symptoms, such as leaking fluid, bleeding, severe headaches, or abdominal cramping.  If you are using any  tobacco products, including cigarettes, chewing tobacco, and electronic cigarettes.  If you have any questions. Other tests or screenings that may be performed during your third trimester include:  Blood tests that check for low iron levels (anemia).  Fetal testing to check the health, activity level, and growth of the fetus. Testing is done if you have certain medical conditions or if there are problems during the pregnancy.  Nonstress test (NST). This test checks the health of your baby to make sure there are no signs of problems, such as the baby not getting enough oxygen. During this test, a belt is placed around your belly. The baby is made to move, and its heart rate is monitored during movement. What is false labor? False labor is a condition in which you feel small, irregular tightenings of the muscles in the womb (contractions) that usually go away with rest, changing position, or drinking water. These are called Braxton Hicks contractions. Contractions may last for hours, days, or even weeks before true labor sets in. If contractions come at regular intervals, become more frequent, increase in intensity, or become painful, you should see your health care provider. What are the signs of labor?  Abdominal cramps.  Regular contractions that start at 10 minutes apart and become stronger and more frequent with time.  Contractions that start on the top of the uterus and spread down to the lower abdomen and back.  Increased pelvic pressure and dull back pain.  A watery or bloody mucus discharge that comes from the vagina.  Leaking of amniotic fluid. This is also known as your "water breaking." It could be a slow trickle or a gush. Let your health care provider know if it has a color or strange odor. If you have any of these signs, call your health care provider right away, even if it is before your due date. Follow these instructions at home: Medicines  Follow your health care  provider's instructions regarding medicine use. Specific medicines may be either safe or unsafe to take during pregnancy.  Take a prenatal vitamin that contains at least 600 micrograms (mcg) of folic acid.  If you develop constipation, try taking a stool softener if your health care provider approves. Eating and drinking   Eat a balanced diet that includes fresh fruits and vegetables, whole grains, good sources of protein such as meat, eggs, or tofu, and low-fat dairy. Your health care provider will help you determine the amount of weight gain that is right for you.  Avoid raw meat and uncooked cheese. These carry germs that can cause birth defects in the baby.  If you have low calcium intake from food, talk to your health care provider about whether you should take a daily calcium supplement.  Eat four or five small meals rather than three large meals a day.  Limit foods that are high in fat and processed sugars, such as fried and sweet foods.  To prevent constipation: ? Drink enough fluid to keep your urine clear or pale yellow. ? Eat foods that are high in fiber, such as fresh fruits and vegetables, whole grains, and beans. Activity  Exercise only as directed by your health care provider. Most women can continue their usual exercise routine during pregnancy. Try to exercise for 30 minutes at least 5 days a week. Stop exercising if you experience uterine contractions.  Avoid heavy lifting.  Do not exercise in extreme heat or humidity, or at high altitudes.  Wear low-heel, comfortable shoes.  Practice good posture.  You may continue to have sex unless your health care provider tells you otherwise. Relieving pain and discomfort  Take frequent breaks and rest with your legs elevated if you have leg cramps or low back pain.  Take warm sitz baths to soothe any pain or discomfort caused by hemorrhoids. Use hemorrhoid cream if your health care provider approves.  Wear a good  support bra to prevent discomfort from breast tenderness.  If you develop varicose veins: ? Wear support pantyhose or compression stockings as told by your healthcare provider. ? Elevate your feet for 15 minutes, 3-4 times a day. Prenatal care  Write down your questions. Take them to your prenatal visits.  Keep all your prenatal visits as told by your health care provider. This is important. Safety  Wear your seat belt at all times when driving.  Make a list of emergency phone numbers, including numbers for family, friends, the hospital, and police and fire departments. General instructions  Avoid cat litter boxes and soil used by cats. These carry germs that can cause birth defects in the baby. If you have a cat, ask someone to clean the litter box for you.  Do not travel far distances unless it is absolutely necessary and only with the approval of your health care provider.  Do not use hot tubs, steam rooms, or saunas.  Do not drink alcohol.  Do not use any products that contain nicotine or tobacco, such as cigarettes and e-cigarettes. If you need help quitting, ask your health care provider.  Do not use any medicinal herbs or unprescribed drugs. These chemicals affect the formation and growth of the baby.  Do not douche or use tampons or scented sanitary pads.  Do not cross your legs for long periods of time.  To prepare for the arrival of your baby: ? Take prenatal classes to understand, practice, and ask questions about labor and delivery. ? Make a trial run to the hospital. ? Visit the hospital and tour the maternity area. ? Arrange for maternity or paternity leave through employers. ? Arrange for family and friends to take care of pets while you are in the hospital. ? Purchase a rear-facing car seat and make sure you know how to install it in your car. ? Pack your hospital bag. ? Prepare the baby's nursery. Make sure to remove all pillows and stuffed animals from the  baby's crib to prevent suffocation.  Visit your dentist if you have not gone during your pregnancy. Use a soft toothbrush to brush your teeth and be gentle when you floss. Contact a health care provider if:  You are unsure if you are in labor or if your water has broken.  You become dizzy.  You have mild pelvic cramps, pelvic pressure, or nagging pain in your abdominal area.  You have lower back pain.  You have persistent nausea, vomiting, or diarrhea.  You have an unusual  or bad smelling vaginal discharge.  You have pain when you urinate. Get help right away if:  Your water breaks before 37 weeks.  You have regular contractions less than 5 minutes apart before 37 weeks.  You have a fever.  You are leaking fluid from your vagina.  You have spotting or bleeding from your vagina.  You have severe abdominal pain or cramping.  You have rapid weight loss or weight gain.  You have shortness of breath with chest pain.  You notice sudden or extreme swelling of your face, hands, ankles, feet, or legs.  Your baby makes fewer than 10 movements in 2 hours.  You have severe headaches that do not go away when you take medicine.  You have vision changes. Summary  The third trimester is from week 28 through week 40, months 7 through 9. The third trimester is a time when the unborn baby (fetus) is growing rapidly.  During the third trimester, your discomfort may increase as you and your baby continue to gain weight. You may have abdominal, leg, and back pain, sleeping problems, and an increased need to urinate.  During the third trimester your breasts will keep growing and they will continue to become tender. A yellow fluid (colostrum) may leak from your breasts. This is the first milk you are producing for your baby.  False labor is a condition in which you feel small, irregular tightenings of the muscles in the womb (contractions) that eventually go away. These are called Braxton  Hicks contractions. Contractions may last for hours, days, or even weeks before true labor sets in.  Signs of labor can include: abdominal cramps; regular contractions that start at 10 minutes apart and become stronger and more frequent with time; watery or bloody mucus discharge that comes from the vagina; increased pelvic pressure and dull back pain; and leaking of amniotic fluid. This information is not intended to replace advice given to you by your health care provider. Make sure you discuss any questions you have with your health care provider. Document Revised: 06/29/2018 Document Reviewed: 04/13/2016 Elsevier Patient Education  2020 [employer].

## 2019-08-30 NOTE — MAU Note (Signed)
Last night was having some sharp pains.  Started again today, they did go away, were not  Back to back. No longer having them. No hx of PTL.  No leaking or bleeding.

## 2019-08-30 NOTE — MAU Provider Note (Signed)
History     CSN: [id number]  Arrival date and time: 08/30/19 1223   First Provider Initiated Contact with Patient 08/30/19 1325      Chief Complaint  Patient presents with  . Abdominal Pain   Sarah Mayo is a 29 y.o. G3P2002 at 47w2dwho presents to MAU for abdominal pain that she reports is not present at this time. Patient reports intercourse in past 24 hours. Patient does not have pregnant support belt at home.  Onset: last night around 11PM Location: right and left sides of abdomen Duration: about 5 minutes Character: left-sided pain last night, right-sided pain at lunch time, sharp, intermittent, stabbing, lats about 5 minutes at a time Aggravating/Associated: none/none Relieving: none Treatment: warm bubble bath - worked Severity: 0/10  Pt denies VB, LOF, ctx, decreased FM, vaginal discharge/odor/itching. Pt denies N/V, abdominal pain, constipation, diarrhea, or urinary problems. Pt denies fever, chills, fatigue, sweating or changes in appetite. Pt denies SOB or chest pain. Pt denies dizziness, HA, light-headedness, weakness.  Problems this pregnancy include: hx GDM, ?BP, fetal pyelectasis. Allergies? NKDA Current medications/supplements? BASA, PNVs Prenatal care provider? [facility], next appt 09/13/2019   OB History    Gravida  3   Para  2   Term  2   Preterm      AB      Living  2     SAB      TAB      Ectopic      Multiple  0   Live Births  2           Past Medical History:  Diagnosis Date  . Gestational diabetes   . Sickle cell trait ([facility]     Past Surgical History:  Procedure Laterality Date  . NO PAST SURGERIES      Family History  Problem Relation Age of Onset  . Asthma Brother   . Sickle cell trait Mother     Social History   Tobacco Use  . Smoking status: Never Smoker  . Smokeless tobacco: Never Used  Vaping Use  . Vaping Use: Never used  Substance Use Topics  . Alcohol use: No    Comment: Social, Last  drink August 2018  . Drug use: No    Types: Marijuana    Comment: quit with pregnancy    Allergies: No Known Allergies  Medications Prior to Admission  Medication Sig Dispense Refill Last Dose  . aspirin EC 81 MG tablet Take 1 tablet (81 mg total) by mouth daily. Take after 12 weeks for prevention of preeclampsia later in pregnancy. Start taking after 05/31/2019 300 tablet 0 Past Month at Unknown time  . Prenat-FeCbn-FeAsp-Meth-FA-DHA (PRENATE MINI) 18-0.6-0.4-350 MG CAPS Take 1 tablet by mouth daily. 60 capsule 7 08/29/2019 at Unknown time  . Blood Pressure Monitoring (BLOOD PRESSURE KIT) DEVI 1 kit by Does not apply route as needed. 1 each 0   . Prenat-Fe Poly-Methfol-FA-DHA (VITAFOL FE+) 90-0.6-0.4-200 MG CAPS Take 1 tablet by mouth daily. (Patient not taking: Reported on 06/21/2019) 30 capsule 12     Review of Systems  Constitutional: Negative for chills, diaphoresis, fatigue and fever.  Eyes: Negative for visual disturbance.  Respiratory: Negative for shortness of breath.   Cardiovascular: Negative for chest pain.  Gastrointestinal: Positive for abdominal pain. Negative for constipation, diarrhea, nausea and vomiting.  Genitourinary: Negative for dysuria, flank pain, frequency, pelvic pain, urgency, vaginal bleeding and vaginal discharge.  Neurological: Negative for dizziness, weakness, light-headedness and headaches.  Physical Exam   Blood pressure 131/66, pulse 90, temperature 98.7 F (37.1 C), temperature source Oral, resp. rate 18, height 5' 9" (1.753 m), weight 118.8 kg, last menstrual period 03/01/2019, SpO2 100 %.  Patient Vitals for the past 24 hrs:  BP Temp Temp src Pulse Resp SpO2 Height Weight  08/30/19 1326 131/66 -- -- 90 -- 100 % -- --  08/30/19 1240 131/67 98.7 F (37.1 C) Oral 99 18 99 % 5' 9" (1.753 m) 118.8 kg   Physical Exam  Constitutional: She is oriented to person, place, and time. She appears well-developed. No distress.  HENT:  Head: Normocephalic and  atraumatic.  Respiratory: Effort normal.  GI: Soft. She exhibits no distension and no mass. There is no abdominal tenderness. There is no rebound and no guarding.  Genitourinary:    Vulva normal.  There is no rash, tenderness or lesion on the right labia. There is no rash, tenderness or lesion on the left labia.    No vaginal discharge.     Genitourinary Comments: CE: long/closed/posterior   Neurological: She is alert and oriented to person, place, and time.  Skin: Skin is warm and dry. She is not diaphoretic.  Psychiatric: Her behavior is normal. Judgment and thought content normal.   Results for orders placed or performed during the hospital encounter of 08/30/19 (from the past 24 hour(s))  Urinalysis, Routine w reflex microscopic     Status: Abnormal   Collection Time: 08/30/19  1:15 PM  Result Value Ref Range   Color, Urine YELLOW YELLOW   APPearance CLEAR CLEAR   Specific Gravity, Urine 1.029 1.005 - 1.030   pH 6.0 5.0 - 8.0   Glucose, UA 150 (A) NEGATIVE mg/dL   Hgb urine dipstick NEGATIVE NEGATIVE   Bilirubin Urine NEGATIVE NEGATIVE   Ketones, ur NEGATIVE NEGATIVE mg/dL   Protein, ur NEGATIVE NEGATIVE mg/dL   Nitrite NEGATIVE NEGATIVE   Leukocytes,Ua TRACE (A) NEGATIVE   RBC / HPF 0-5 0 - 5 RBC/hpf   WBC, UA 0-5 0 - 5 WBC/hpf   Bacteria, UA NONE SEEN NONE SEEN   Squamous Epithelial / LPF 0-5 0 - 5   Mucus PRESENT   Wet prep, genital     Status: Abnormal   Collection Time: 08/30/19  2:05 PM  Result Value Ref Range   Yeast Wet Prep HPF POC NONE SEEN NONE SEEN   Trich, Wet Prep NONE SEEN NONE SEEN   Clue Cells Wet Prep HPF POC NONE SEEN NONE SEEN   WBC, Wet Prep HPF POC MODERATE (A) NONE SEEN   Sperm NONE SEEN    MAU Course  Procedures  MDM -suspect RLP, r/o PTL -UA: 150GLU/trace leuks, sending urine for culture based on symptoms -CE: long/closed/posterior -fFN: low suspicion for PTL, intercourse in past 24 hours -WetPrep: WNL -GC/CT collected -EFM: reactive        -baseline: 150       -variability: moderate       -accels: present, 15x15       -decels: single variable       -TOCO: quiet -no medication given as patient without pain -pt discharged to home in stable condition  Orders Placed This Encounter  Procedures  . Wet prep, genital    Standing Status:   Standing    Number of Occurrences:   1  . Culture, OB Urine    Standing Status:   Standing    Number of Occurrences:   1  . Urinalysis, Routine w  reflex microscopic    Standing Status:   Standing    Number of Occurrences:   1  . Discharge patient    Order Specific Question:   Discharge disposition    Answer:   01-Home or Self Care [1]    Order Specific Question:   Discharge patient date    Answer:   08/30/2019    Assessment and Plan   1. Round ligament pain   2. [redacted] weeks gestation of pregnancy   3. NST (non-stress test) reactive     Allergies as of 08/30/2019   No Known Allergies     Medication List    TAKE these medications   aspirin EC 81 MG tablet Take 1 tablet (81 mg total) by mouth daily. Take after 12 weeks for prevention of preeclampsia later in pregnancy. Start taking after 05/31/2019   Blood Pressure Kit Devi 1 kit by Does not apply route as needed.   Comfort Fit Maternity Supp Sm Misc 1 Units by Does not apply route daily as needed.   Prenate Mini 18-0.6-0.4-350 MG Caps Take 1 tablet by mouth daily.   Vitafol FE+ 90-0.6-0.4-200 MG Caps Take 1 tablet by mouth daily.       -will call with culture results, if positive -discussed PTL vs. RLP -RX pregnancy support belt -return MAU precautions given -pt discharged to home in stable condition  Elmyra Ricks E Anja Neuzil 08/30/2019, 3:39 PM

## 2019-08-31 LAB — GC/CHLAMYDIA PROBE AMP (~~LOC~~) NOT AT ARMC
Chlamydia: NEGATIVE
Comment: NEGATIVE
Comment: NORMAL
Neisseria Gonorrhea: NEGATIVE

## 2019-09-01 LAB — CULTURE, OB URINE: Culture: 50000 — AB

## 2019-09-03 ENCOUNTER — Ambulatory Visit: Payer: Self-pay

## 2019-09-06 DIAGNOSIS — O339 Maternal care for disproportion, unspecified: Secondary | ICD-10-CM | POA: Diagnosis not present

## 2019-09-07 DIAGNOSIS — Z79899 Other long term (current) drug therapy: Secondary | ICD-10-CM | POA: Diagnosis not present

## 2019-09-10 DIAGNOSIS — Z79899 Other long term (current) drug therapy: Secondary | ICD-10-CM | POA: Diagnosis not present

## 2019-09-12 DIAGNOSIS — Z79899 Other long term (current) drug therapy: Secondary | ICD-10-CM | POA: Diagnosis not present

## 2019-09-13 ENCOUNTER — Encounter: Payer: Self-pay | Admitting: Obstetrics & Gynecology

## 2019-09-13 ENCOUNTER — Other Ambulatory Visit: Payer: Self-pay

## 2019-09-13 ENCOUNTER — Other Ambulatory Visit: Payer: Medicaid Other

## 2019-09-13 ENCOUNTER — Ambulatory Visit (INDEPENDENT_AMBULATORY_CARE_PROVIDER_SITE_OTHER): Payer: Medicaid Other | Admitting: Obstetrics & Gynecology

## 2019-09-13 VITALS — BP 120/75 | HR 102 | Wt 258.0 lb

## 2019-09-13 DIAGNOSIS — Z3480 Encounter for supervision of other normal pregnancy, unspecified trimester: Secondary | ICD-10-CM

## 2019-09-13 DIAGNOSIS — O99210 Obesity complicating pregnancy, unspecified trimester: Secondary | ICD-10-CM | POA: Diagnosis not present

## 2019-09-13 DIAGNOSIS — O99212 Obesity complicating pregnancy, second trimester: Secondary | ICD-10-CM

## 2019-09-13 DIAGNOSIS — Z3A27 27 weeks gestation of pregnancy: Secondary | ICD-10-CM

## 2019-09-13 DIAGNOSIS — O24419 Gestational diabetes mellitus in pregnancy, unspecified control: Secondary | ICD-10-CM

## 2019-09-13 DIAGNOSIS — Z302 Encounter for sterilization: Secondary | ICD-10-CM

## 2019-09-13 DIAGNOSIS — E669 Obesity, unspecified: Secondary | ICD-10-CM

## 2019-09-13 NOTE — Patient Instructions (Signed)
Postpartum Tubal Ligation Postpartum tubal ligation (PPTL) is a procedure to close the fallopian tubes. This is done so that you cannot get pregnant. When the fallopian tubes are closed, the eggs that the ovaries release cannot enter the uterus, and sperm cannot reach the eggs. PPTL is done right after childbirth or 1-2 days after childbirth, before the uterus returns to its normal location. If you have a cesarean section, it can be performed at the same time as the procedure. Having this done after childbirth does not make your stay in the hospital longer. PPTL is sometimes called "getting your tubes tied." You should not have this procedure if you want to get pregnant again or if you are unsure about having more children. Tell a health care provider about:  Any allergies you have.  All medicines you are taking, including vitamins, herbs, eye drops, creams, and over-the-counter medicines.  Any problems you or family members have had with anesthetic medicines.  Any blood disorders you have.  Any surgeries you have had.  Any medical conditions you have or have had.  Any past pregnancies. What are the risks? Generally, this is a safe procedure. However, problems may occur, including:  Infection.  Bleeding.  Injury to other organs in the abdomen.  Side effects from anesthetic medicines.  Failure of the procedure. If this happens, you could get pregnant.  Having a fertilized egg attach outside the uterus (ectopic pregnancy). What happens before the procedure?  Ask your health care provider about: ? How much pain you can expect to have. ? What medicines you will be given for pain, especially if you are planning to breastfeed. What happens during the procedure? If you had a vaginal delivery:  You will be given one or more of the following: ? A medicine to help you relax (sedative). ? A medicine to numb the area (local anesthetic). ? A medicine to make you fall asleep (general  anesthetic). ? A medicine that is injected into an area of your body to numb everything below the injection site (regional anesthetic).  If you have been given a general anesthetic, a tube will be put down your throat to help you breathe.  An IV will be inserted into one of your veins.  Your bladder may be emptied with a small tube (catheter).  An incision will be made just below your belly button.  Your fallopian tubes will be located and brought up through the incision.  Your fallopian tubes will be tied off, burned (cauterized), or blocked with a clip, ring, or clamp. A small part in the center of each fallopian tube may be removed.  The incision will be closed with stitches (sutures).  A bandage (dressing) will be placed over the incision. If you had a cesarean delivery:  Tubal ligation will be done through the incision that was used for the cesarean delivery of your baby.  The incision will be closed with sutures.  A dressing will be placed over the incision. The procedure may vary among health care providers and hospitals. What happens after the procedure?  Your blood pressure, heart rate, breathing rate, and blood oxygen level will be monitored until you leave the hospital.  You will be given pain medicine as needed.  Do not drive for 24 hours if you were given a sedative during your procedure. Summary  Postpartum tubal ligation is a procedure that closes the fallopian tubes so you cannot get pregnant anymore.  This procedure is done while you are still   in the hospital after childbirth. If you have a cesarean section, it can be performed at the same time.  Having this done after childbirth does not make your stay in the hospital longer.  Postpartum tubal ligation is considered permanent. You should not have this procedure if you want to get pregnant again or if you are unsure about having more children.  Talk to your health care provider to see if this procedure is  right for you. This information is not intended to replace advice given to you by your health care provider. Make sure you discuss any questions you have with your health care provider. Document Revised: 08/21/2018 Document Reviewed: 01/26/2018 Elsevier Patient Education  2020 [employer]  

## 2019-09-13 NOTE — Progress Notes (Signed)
PRENATAL VISIT NOTE  Subjective:  Sarah Mayo is a 29 y.o. G3P2002 at [redacted]w[redacted]d being seen today for ongoing prenatal care.  She is currently monitored for the following issues for this low-risk pregnancy and has BMI 37.0-37.9, adult; Sickle cell trait ([place]); Hemoglobin C trait ([place]); History of marijuana use; Supervision of other normal pregnancy, antepartum; Obesity during pregnancy, antepartum; Hx of gestational diabetes in prior pregnancy, currently pregnant; and Alpha thalassemia silent carrier on their problem list.  Patient reports no complaints.  Contractions: Irritability. Vag. Bleeding: None.  Movement: Present. Denies leaking of fluid.   The following portions of the patient's history were reviewed and updated as appropriate: allergies, current medications, past family history, past medical history, past social history, past surgical history and problem list.   Objective:   Vitals:   09/13/19 0927  BP: 120/75  Pulse: (!) 102  Weight: 258 lb (117 kg)    Fetal Status: Fetal Heart Rate (bpm): 152   Movement: Present     General:  Alert, oriented and cooperative. Patient is in no acute distress.  Skin: Skin is warm and dry. No rash noted.   Cardiovascular: Normal heart rate noted  Respiratory: Normal respiratory effort, no problems with respiration noted  Abdomen: Soft, gravid, appropriate for gestational age.  Pain/Pressure: Absent     Pelvic: Cervical exam deferred        Extremities: Normal range of motion.  Edema: None  Mental Status: Normal mood and affect. Normal behavior. Normal judgment and thought content.   [place] MFM OB DETAIL +14 WK  Result Date: 07/17/2019 ----------------------------------------------------------------------  OBSTETRICS REPORT                       (Signed Final 07/17/2019 03:43 pm) ---------------------------------------------------------------------- Patient Info  ID #:       [id number]                          D.O.B.:  1991/03/04 (29 yrs)  Name:        Sarah Mayo              Visit Date: 07/17/2019 10:35 am ---------------------------------------------------------------------- Performed By  Performed By:     Rodrigo Ran BS      Ref. Address:     [address]                                                             [address]                                                             [phone]  [place]                                                             [address]  Attending:        Ma Rings MD         [facility]  Referred By:      [facility] ---------------------------------------------------------------------- Orders   #  Description                          Code         Ordered By   1  [place] MFM OB DETAIL +14 WK              76811.01     Steward Drone  ----------------------------------------------------------------------   #  Order #                    Accession #                 Episode #   1  [id number]                  [id number]                  [id number]  ---------------------------------------------------------------------- Indications   Antenatal screening for malformations (low     Z36.3   risk NIPS, neg AFP)   Obesity complicating pregnancy, second         O99.212   trimester   [redacted] weeks gestation of pregnancy                Z3A.19   History of sickle cell trait                   Z86.2   Genetic carrier (hemoglobin C/silent alpha     Z14.8   thal)   Poor obstetric history: Previous gestational   O09.299   diabetes  ---------------------------------------------------------------------- Fetal Evaluation  Num Of Fetuses:         1  Fetal Heart Rate(bpm):  164  Cardiac Activity:       Observed  Presentation:           Breech  Placenta:               Posterior  P. Cord Insertion:      Visualized  Amniotic Fluid  AFI FV:      Within normal limits                               Largest Pocket(cm)                              5.92 ---------------------------------------------------------------------- Biometry  BPD:  48.1  mm     G. Age:  20w 4d         96  %    CI:        76.52   %    70 - 86                                                          FL/HC:      18.5   %    16.1 - 18.3  HC:      174.2  mm     G. Age:  20w 0d         84  %    HC/AC:      1.12        1.09 - 1.39  AC:      156.2  mm     G. Age:  20w 5d         93  %    FL/BPD:     66.9   %  FL:       32.2  mm     G. Age:  20w 0d         79  %    FL/AC:      20.6   %    20 - 24  HUM:      30.5  mm     G. Age:  20w 1d         78  %  CER:      20.5  mm     G. Age:  19w 4d         61  %  NFT:         4  mm  CM:        5.2  mm  Est. FW:     351  gm    0 lb 12 oz      99  % ---------------------------------------------------------------------- OB History  Gravidity:    3         Term:   2        Prem:   0        SAB:   0  TOP:          0       Ectopic:  0        Living: 2 ---------------------------------------------------------------------- Gestational Age  LMP:           19w 5d        Date:  03/01/19                 EDD:   12/06/19  U/S Today:     20w 2d                                        EDD:   12/02/19  Best:          19w 0d     Det. ByMarcella Dubs         EDD:   12/11/19                                      (  04/23/19) ---------------------------------------------------------------------- Anatomy  Cranium:               Appears normal         LVOT:                   Appears normal  Cavum:                 Appears normal         Aortic Arch:            Appears normal  Ventricles:            Appears normal         Ductal Arch:            Appears normal  Choroid Plexus:        Appears normal         Diaphragm:              Appears normal  Cerebellum:            Appears normal         Stomach:                Appears normal, left                                                                         sided  Posterior Fossa:       Appears normal         Abdomen:                Appears normal  Nuchal Fold:           Appears normal         Abdominal Wall:         Appears nml (cord                                                                        insert, abd wall)  Face:                  Appears normal         Cord Vessels:           Appears normal (3                         (orbits and profile)                           vessel cord)  Lips:                  Appears normal         Kidneys:                Bilateral UTD RT 7  mm LT 5.6 mm  Palate:                Appears normal         Bladder:                Appears normal  Thoracic:              Appears normal         Spine:                  Appears normal  Heart:                 Appears normal         Upper Extremities:      Appears normal                         (4CH, axis, and                         situs)  RVOT:                  Appears normal         Lower Extremities:      Appears normal  Other:  Heels/feet and open hands/5th digits visualized. Nasal bone          visualized. ---------------------------------------------------------------------- Cervix Uterus Adnexa  Cervix  Length:           3.59  cm.  Normal appearance by transabdominal scan.  Uterus  No abnormality visualized.  Left Ovary  Within normal limits.  Right Ovary  Within normal limits.  Cul De Sac  No free fluid seen.  Adnexa  No abnormality visualized. ---------------------------------------------------------------------- Comments  This patient was seen for a detailed fetal anatomy scan due  to maternal obesity.  She denies any significant past medical history and denies  any problems in her current pregnancy.  She had a cell free DNA test earlier in her pregnancy which  indicated a low risk for trisomy 23, 53, and 13. A female fetus is  predicted.  She was informed that the fetal growth and amniotic fluid  level were  appropriate for her gestational age.  Mild bilateral pyelectasis measuring 0.6 to 0.7 cm dilated was  noted on today's ultrasound exam.  The patient was advised  that the mild bilateral pylectasis noted today is most likely a  normal variant.  However, there may also be other processes  causing this finding that may require treatment after birth.  The association between pyelectasis and Down syndrome  was discussed.  Due to this finding, the patient was advised  regarding the increased risk of Down syndrome.  She was  offered and declined an amniocentesis today for definitive  diagnosis of fetal aneuploidy.  The patient reports that her  daughter was also found to have bilateral pyelectasis during  her prenatal ultrasounds.  The pyelectasis resolved after birth  and therefore she is less concerned regarding this finding.  She was advised that we will continue to follow her closely  throughout her pregnancy to assess this finding.  The patient was informed that anomalies may be missed due  to technical limitations. If the fetus is in a suboptimal position  or maternal habitus is increased, visualization of the fetus in  the maternal uterus may be impaired.  A follow-up exam was scheduled in 4 weeks for follow-up of  the  mild bilateral pyelectasis noted today. ----------------------------------------------------------------------                   Ma Rings, MD Electronically Signed Final Report   07/17/2019 03:43 pm ----------------------------------------------------------------------  [place] MFM OB FOLLOW UP  Result Date: 08/14/2019 ----------------------------------------------------------------------  OBSTETRICS REPORT                       (Signed Final 08/14/2019 03:39 pm) ---------------------------------------------------------------------- Patient Info  ID #:       [id number]                          D.O.B.:  October 05, 1990 (29 yrs)  Name:       Sarah Mayo              Visit Date: 08/14/2019 09:54 am  ---------------------------------------------------------------------- Performed By  Attending:        Ma Rings MD         Ref. Address:     [address]                                                             [address]                                                             [place]                                                             [address]  Performed By:     Eden Lathe BS      Location[facility]                                 [facility]  Referred By:      [facility] ---------------------------------------------------------------------- Orders  #  Description                           Code        Ordered By  1  [place] MFM OB FOLLOW UP                   19147.82    Rosana Hoes ----------------------------------------------------------------------  #  Order #  Accession #                Episode #  1  [id number]                   [id number]                 [id number] ---------------------------------------------------------------------- Indications  Obesity complicating pregnancy, second         O99.212  trimester  [redacted] weeks gestation of pregnancy                Z3A.23  Encounter for other antenatal screening        Z36.2  follow-up (low risk NIPS, neg AFP)  History of sickle cell trait                   Z86.2  Genetic carrier (hemoglobin C/silent alpha     Z14.8  thal)  Poor obstetric history: Previous gestational   O09.299  diabetes ---------------------------------------------------------------------- Fetal Evaluation  Num Of Fetuses:         1  Fetal Heart Rate(bpm):  144  Cardiac Activity:       Observed  Presentation:           Breech  Placenta:               Posterior  P. Cord Insertion:      Visualized  Amniotic Fluid  AFI FV:      Within normal limits                              Largest Pocket(cm)                              6.3  ---------------------------------------------------------------------- Biometry  BPD:      60.7  mm     G. Age:  24w 5d         94  %    CI:        74.34   %    70 - 86                                                          FL/HC:      19.1   %    19.2 - 20.8  HC:      223.5  mm     G. Age:  24w 3d         85  %    HC/AC:      1.07        1.05 - 1.21  AC:      208.3  mm     G. Age:  25w 3d         96  %    FL/BPD:     70.5   %    71 - 87  FL:       42.8  mm     G. Age:  24w 0d         71  %    FL/AC:      20.5   %    20 - 24  HUM:      42.1  mm     G. Age:  25w 2d       > 95  %  Est. FW:     730  gm    1 lb 10 oz      99  % ---------------------------------------------------------------------- OB History  Gravidity:    3         Term:   2        Prem:   0        SAB:   0  TOP:          0       Ectopic:  0        Living: 2 ---------------------------------------------------------------------- Gestational Age  LMP:           23w 5d        Date:  03/01/19                 EDD:   12/06/19  U/S Today:     24w 5d                                        EDD:   11/29/19  Best:          23w 0d     Det. ByMarcella Dubs         EDD:   12/11/19                                      (04/23/19) ---------------------------------------------------------------------- Anatomy  Cranium:               Appears normal         LVOT:                   Appears normal  Cavum:                 Appears normal         Aortic Arch:            Appears normal  Ventricles:            Appears normal         Ductal Arch:            Previously seen  Choroid Plexus:        Appears normal         Diaphragm:              Previously seen  Cerebellum:            Appears normal         Stomach:                Appears normal, left                                                                        sided  Posterior Fossa:       Appears normal         Abdomen:  Appears normal  Nuchal Fold:           Previously seen        Abdominal Wall:          Appears nml (cord                                                                        insert, abd wall)  Face:                  Appears normal         Cord Vessels:           Appears normal (3                         (orbits and profile)                           vessel cord)  Lips:                  Previously seen        Kidneys:                Bilateral UTD RT 9                                                                        mm LT 6.3 mm  Palate:                Previously seen        Bladder:                Appears normal  Thoracic:              Appears normal         Spine:                  Previously seen  Heart:                 Appears normal         Upper Extremities:      Previously seen                         (4CH, axis, and                         situs)  RVOT:                  Appears normal         Lower Extremities:      Previously seen  Other:  Heels/feet and open hands/5th digits previously visualized. Nasal          bone visualized. ---------------------------------------------------------------------- Cervix Uterus Adnexa  Cervix  Length:           3.75  cm.  Normal appearance by transabdominal scan.  Uterus  No abnormality visualized.  Right Ovary  Within normal limits.  Left Ovary  Within normal limits.  Cul De Sac  No free fluid seen.  Adnexa  No abnormality visualized. ---------------------------------------------------------------------- Comments  This patient was seen for a follow up growth scan due to  bilateral pyelectasis that was noted during her prior  ultrasound exam.  She denies any problems since her last  exam.  She was informed that the fetal growth continues to measure  in the upper normal limits for her gestational age.  There was  normal amniotic fluid noted today.  Bilateral pyelectasis measuring 0.9 cm on the right and 0.6  cm on the left continues to be noted today.  A normal-  appearing filled fetal bladder along with normal amniotic fluid  were noted today,  indicating that at least one or both of the  fetal kidneys are functioning normally.  She was advised that  her baby will require evaluation after birth to determine if any  treatment for the bilateral pyelectasis noted today will be  necessary.  A follow up exam was scheduled in 5-6 weeks to reassess  the fetal kidneys. ----------------------------------------------------------------------                   Victor Fang, MD Electronically Signed Final Report   08/14/2019 03:39 pm -----------------------Ma Rings-----------------------------------------------   Assessment and Plan:  Pregnancy: [id number] at 4934w2d 1. Obesity during pregnancy, antepartum Next growth scan later this month. TWG 4 lbs, recommended 11-20 lbs. - Comprehensive metabolic panel  2. Request for sterilization Counseled about BTL, also discussed LARCs/vasectomy. R/B/I/A discussed. Medicaid papers signed today.  3. [redacted] weeks gestation of pregnancy 4. Supervision of other normal pregnancy, antepartum - Glucose Tolerance, 2 Hours w/1 Hour - CBC - RPR - HIV Antibody (routine testing w rflx) Considering Tdap, may get in later visit.  Preterm labor symptoms and general obstetric precautions including but not limited to vaginal bleeding, contractions, leaking of fluid and fetal movement were reviewed in detail with the patient. Please refer to After Visit Summary for other counseling recommendations.   Return in about 2 weeks (around 09/27/2019) for OFFICE OB Visit.  Future Appointments  Date Time Provider Department Center  09/18/2019  9:15 AM [facility]  09/18/2019  9:15 AM [facility]    Jaynie CollinsUgonna Leota Maka, MD

## 2019-09-14 ENCOUNTER — Encounter: Payer: Self-pay | Admitting: Obstetrics & Gynecology

## 2019-09-14 DIAGNOSIS — O3663X0 Maternal care for excessive fetal growth, third trimester, not applicable or unspecified: Secondary | ICD-10-CM | POA: Insufficient documentation

## 2019-09-14 DIAGNOSIS — O24419 Gestational diabetes mellitus in pregnancy, unspecified control: Secondary | ICD-10-CM | POA: Insufficient documentation

## 2019-09-14 LAB — COMPREHENSIVE METABOLIC PANEL
ALT: 18 IU/L (ref 0–32)
AST: 13 IU/L (ref 0–40)
Albumin/Globulin Ratio: 1.3 (ref 1.2–2.2)
Albumin: 3.7 g/dL — ABNORMAL LOW (ref 3.9–5.0)
Alkaline Phosphatase: 92 IU/L (ref 48–121)
BUN/Creatinine Ratio: 16 (ref 9–23)
BUN: 8 mg/dL (ref 6–20)
Bilirubin Total: 0.2 mg/dL (ref 0.0–1.2)
CO2: 18 mmol/L — ABNORMAL LOW (ref 20–29)
Calcium: 9 mg/dL (ref 8.7–10.2)
Chloride: 108 mmol/L — ABNORMAL HIGH (ref 96–106)
Creatinine, Ser: 0.5 mg/dL — ABNORMAL LOW (ref 0.57–1.00)
GFR calc Af Amer: 153 mL/min/{1.73_m2} (ref >59)
GFR calc non Af Amer: 133 mL/min/{1.73_m2} (ref >59)
Globulin, Total: 2.9 g/dL (ref 1.5–4.5)
Glucose: 120 mg/dL — ABNORMAL HIGH (ref 65–99)
Potassium: 4.2 mmol/L (ref 3.5–5.2)
Sodium: 141 mmol/L (ref 134–144)
Total Protein: 6.6 g/dL (ref 6.0–8.5)

## 2019-09-14 LAB — CBC
Hematocrit: 33.4 % — ABNORMAL LOW (ref 34.0–46.6)
Hemoglobin: 11.2 g/dL (ref 11.1–15.9)
MCH: 26.6 pg (ref 26.6–33.0)
MCHC: 33.5 g/dL (ref 31.5–35.7)
MCV: 79 fL (ref 79–97)
Platelets: 285 10*3/uL (ref 150–450)
RBC: 4.21 x10E6/uL (ref 3.77–5.28)
RDW: 15.3 % (ref 11.7–15.4)
WBC: 13.9 10*3/uL — ABNORMAL HIGH (ref 3.4–10.8)

## 2019-09-14 LAB — GLUCOSE TOLERANCE, 2 HOURS W/ 1HR
Glucose, 1 hour: 210 mg/dL — ABNORMAL HIGH (ref 65–179)
Glucose, 2 hour: 199 mg/dL — ABNORMAL HIGH (ref 65–152)
Glucose, Fasting: 111 mg/dL — ABNORMAL HIGH (ref 65–91)

## 2019-09-14 LAB — HIV ANTIBODY (ROUTINE TESTING W REFLEX): HIV Screen 4th Generation wRfx: NONREACTIVE

## 2019-09-14 LAB — RPR: RPR Ser Ql: NONREACTIVE

## 2019-09-14 NOTE — Addendum Note (Signed)
Addended by: Jaynie Collins A on: 09/14/2019 08:26 AM   Modules accepted: Orders

## 2019-09-17 DIAGNOSIS — Z79899 Other long term (current) drug therapy: Secondary | ICD-10-CM | POA: Diagnosis not present

## 2019-09-18 ENCOUNTER — Ambulatory Visit: Payer: Medicaid Other | Attending: Obstetrics

## 2019-09-18 ENCOUNTER — Ambulatory Visit: Payer: Medicaid Other | Admitting: *Deleted

## 2019-09-18 ENCOUNTER — Encounter: Payer: Medicaid Other | Attending: Obstetrics & Gynecology | Admitting: Registered"

## 2019-09-18 ENCOUNTER — Other Ambulatory Visit: Payer: Self-pay | Admitting: Lactation Services

## 2019-09-18 ENCOUNTER — Other Ambulatory Visit: Payer: Self-pay

## 2019-09-18 ENCOUNTER — Other Ambulatory Visit: Payer: Self-pay | Admitting: *Deleted

## 2019-09-18 ENCOUNTER — Ambulatory Visit: Payer: Medicaid Other | Admitting: Registered"

## 2019-09-18 DIAGNOSIS — O09293 Supervision of pregnancy with other poor reproductive or obstetric history, third trimester: Secondary | ICD-10-CM

## 2019-09-18 DIAGNOSIS — O24419 Gestational diabetes mellitus in pregnancy, unspecified control: Secondary | ICD-10-CM | POA: Diagnosis not present

## 2019-09-18 DIAGNOSIS — D563 Thalassemia minor: Secondary | ICD-10-CM

## 2019-09-18 DIAGNOSIS — Z3A28 28 weeks gestation of pregnancy: Secondary | ICD-10-CM | POA: Diagnosis not present

## 2019-09-18 DIAGNOSIS — E669 Obesity, unspecified: Secondary | ICD-10-CM

## 2019-09-18 DIAGNOSIS — Z862 Personal history of diseases of the blood and blood-forming organs and certain disorders involving the immune mechanism: Secondary | ICD-10-CM

## 2019-09-18 DIAGNOSIS — O358XX0 Maternal care for other (suspected) fetal abnormality and damage, not applicable or unspecified: Secondary | ICD-10-CM | POA: Diagnosis not present

## 2019-09-18 DIAGNOSIS — O35EXX0 Maternal care for other (suspected) fetal abnormality and damage, fetal genitourinary anomalies, not applicable or unspecified: Secondary | ICD-10-CM

## 2019-09-18 DIAGNOSIS — O99210 Obesity complicating pregnancy, unspecified trimester: Secondary | ICD-10-CM | POA: Insufficient documentation

## 2019-09-18 DIAGNOSIS — O24410 Gestational diabetes mellitus in pregnancy, diet controlled: Secondary | ICD-10-CM

## 2019-09-18 DIAGNOSIS — Z148 Genetic carrier of other disease: Secondary | ICD-10-CM | POA: Diagnosis not present

## 2019-09-18 DIAGNOSIS — Z713 Dietary counseling and surveillance: Secondary | ICD-10-CM | POA: Insufficient documentation

## 2019-09-18 DIAGNOSIS — O99213 Obesity complicating pregnancy, third trimester: Secondary | ICD-10-CM

## 2019-09-18 DIAGNOSIS — Z3A00 Weeks of gestation of pregnancy not specified: Secondary | ICD-10-CM | POA: Diagnosis not present

## 2019-09-18 DIAGNOSIS — Z362 Encounter for other antenatal screening follow-up: Secondary | ICD-10-CM | POA: Diagnosis not present

## 2019-09-18 MED ORDER — ACCU-CHEK GUIDE VI STRP
ORAL_STRIP | 12 refills | Status: DC
Start: 2019-09-18 — End: 2019-11-23

## 2019-09-18 MED ORDER — ACCU-CHEK SOFTCLIX LANCETS MISC
12 refills | Status: DC
Start: 2019-09-18 — End: 2019-11-23

## 2019-09-18 NOTE — Progress Notes (Signed)
Patient was seen on 09/18/19 for Gestational Diabetes self-management. EDD 12/11/19.    The following learning objectives were met by the patient :   States the definition of Gestational Diabetes  States why dietary management is important in controlling blood glucose  Describes the effects of carbohydrates on blood glucose levels  Demonstrates ability to create a balanced meal plan  Demonstrates carbohydrate counting   States when to check blood glucose levels  Demonstrates proper blood glucose monitoring techniques  States the effect of stress and exercise on blood glucose levels  States the importance of limiting caffeine and abstaining from alcohol and smoking  Plan:  Aim for 3 Carbohydrate Choices per meal (45 grams) +/- 1 either way  Aim for 1-2 Carbohydrate Choices per snack Begin reading food labels for Total Carbohydrate of foods If OK with your MD, consider  increasing your activity level by walking, Arm Chair Exercises or other activity daily as tolerated Begin checking Blood Glucose before breakfast and 2 hours after first bite of breakfast, lunch and dinner as directed by MD  Bring Log Book/Sheet and meter to every medical appointment  Baby Scripts: Patient was introduced to [employer] and  plans to use as record of blood glucose electronically  Take medication if directed by MD  Blood glucose monitor given: Acc[employer]k Guide Me Lot #[id number] Exp: 11/08/2020 Blood Glucose: 94 mg/dL  Patient instructed to monitor glucose levels: FBS: 60 - 95 mg/dl 2 hour: <120 mg/dl  Patient received the following handouts:  Nutrition Diabetes and Pregnancy  Carbohydrate Counting List  Blood glucose Log Sheet  Patient will be seen for follow-up in as needed.

## 2019-09-19 ENCOUNTER — Other Ambulatory Visit: Payer: Self-pay

## 2019-09-19 ENCOUNTER — Ambulatory Visit: Payer: Medicaid Other

## 2019-09-19 DIAGNOSIS — Z79899 Other long term (current) drug therapy: Secondary | ICD-10-CM | POA: Diagnosis not present

## 2019-09-27 ENCOUNTER — Encounter: Payer: Self-pay | Admitting: Obstetrics & Gynecology

## 2019-09-27 ENCOUNTER — Ambulatory Visit (INDEPENDENT_AMBULATORY_CARE_PROVIDER_SITE_OTHER): Payer: Medicaid Other | Admitting: Obstetrics & Gynecology

## 2019-09-27 ENCOUNTER — Other Ambulatory Visit: Payer: Self-pay

## 2019-09-27 VITALS — BP 117/75 | HR 109 | Wt 259.8 lb

## 2019-09-27 DIAGNOSIS — O24415 Gestational diabetes mellitus in pregnancy, controlled by oral hypoglycemic drugs: Secondary | ICD-10-CM

## 2019-09-27 DIAGNOSIS — O0993 Supervision of high risk pregnancy, unspecified, third trimester: Secondary | ICD-10-CM

## 2019-09-27 DIAGNOSIS — O3663X0 Maternal care for excessive fetal growth, third trimester, not applicable or unspecified: Secondary | ICD-10-CM

## 2019-09-27 DIAGNOSIS — Z3A29 29 weeks gestation of pregnancy: Secondary | ICD-10-CM

## 2019-09-27 MED ORDER — METFORMIN HCL 500 MG PO TABS: 500.0000 mg | ORAL_TABLET | Freq: Every day | ORAL | 2 refills | Status: DC

## 2019-09-27 NOTE — Patient Instructions (Signed)
Gestational Diabetes Mellitus, Self Care When you have gestational diabetes (gestational diabetes mellitus), you must make sure your blood sugar (glucose) stays in a healthy range. You can do this with:  Nutrition.  Exercise.  Lifestyle changes.  Medicines or insulin, if needed.  Support from your doctors and others. If you get treated for this condition, it may not hurt you or your unborn baby (fetus). If you do not get treated for this condition, it may cause problems that can hurt you or your unborn baby. If you get gestational diabetes, you are:  More likely to get it if you get pregnant again.  More likely to develop type 2 diabetes in the future. How to stay aware of blood sugar   Check your blood sugar every day while you are pregnant. Check it as often as told.  Call your doctor if your blood sugar is above your goal numbers for two tests in a row. Your doctor will set personal treatment goals for you. Generally, you should have these blood sugar levels:  Before meals, or after not eating for a long time (fasting or preprandial): at or below 95 mg/dL (5.3 mmol/L).  After meals (postprandial): ? One hour after a meal: at or below 140 mg/dL (7.8 mmol/L). ? Two hours after a meal: at or below 120 mg/dL (6.7 mmol/L).  A1c (hemoglobin A1c) level: 6-6.5%. How to manage high and low blood sugar Signs of high blood sugar High blood sugar is called hyperglycemia. Know the early signs of high blood sugar. Signs may include:  Feeling: ? Thirsty. ? Hungry. ? Very tired.  Needing to pee (urinate) more than usual.  Blurry vision. Signs of low blood sugar Low blood sugar is called hypoglycemia. This is when blood sugar is at or below 70 mg/dL (3.9 mmol/L). Signs may include:  Feeling: ? Hungry. ? Worried or nervous (anxious). ? Sweaty and clammy. ? Confused. ? Dizzy. ? Sleepy. ? Sick to your stomach (nauseous).  Having: ? A fast heartbeat. ? A headache. ? A change  in your vision. ? Tingling or no feeling (numbness) around your mouth, lips, or tongue. ? Jerky movements that you cannot control (seizure).  Having trouble with: ? Moving (coordination). ? Sleeping. ? Passing out (fainting). ? Getting upset easily (irritability). Treating low blood sugar To treat low blood sugar, eat or drink something sugary right away. If you can think clearly and swallow safely, follow the 15:15 rule:  Take 15 grams of a fast-acting carb (carbohydrate). Talk with your doctor about how much you should take.  Some fast-acting carbs are: ? Sugar tablets (glucose pills). Take 3-4 glucose pills. ? 6-8 pieces of hard candy. ? 4-6 oz (120-150 mL) of fruit juice. ? 4-6 oz (120-150 mL) of regular (not diet) soda. ? 1 Tbsp (15 mL) honey or sugar.  Check your blood sugar 15 minutes after you take the carb.  If your blood sugar is still at or below 70 mg/dL (3.9 mmol/L), take 15 grams of a carb again.  If your blood sugar does not go above 70 mg/dL (3.9 mmol/L) after 3 tries, get help right away.  After your blood sugar goes back to normal, eat a meal or a snack within 1 hour. Treating very low blood sugar If your blood sugar is at or below 54 mg/dL (3 mmol/L), you have very low blood sugar (severe hypoglycemia). This is an emergency. Do not wait to see if the symptoms will go away. Get medical help right  away. Call your local emergency services (911 in the [place] If you have very low blood sugar and you cannot eat or drink, you may need a glucagon shot (injection). A family member or friend should learn how to check your blood sugar and how to give you a glucagon shot. Ask your doctor if you need to have a glucagon shot kit at home. Follow these instructions at home: Medicine  Take your insulin and diabetes medicines as told.  If your doctor says you should take more or less insulin or medicines, do this exactly as told.  Do not run out of insulin or  medicines. Food   Make healthy food choices. These include: ? Chicken, fish, egg whites, and beans. ? Oats, whole wheat, bulgur, brown rice, quinoa, and millet. ? Fresh fruits and vegetables. ? Low-fat dairy products. ? Nuts, avocado, olive oil, and canola oil.  Meet with a food specialist (dietitian). He or she can help you make an eating plan that is right for you.  Follow instructions from your doctor about what you cannot eat or drink.  Drink enough fluid to keep your pee (urine) pale yellow.  Eat healthy snacks between healthy meals.  Keep track of carbs that you eat. Do this by reading food labels and learning food serving sizes.  Follow your sick day plan when you cannot eat or drink normally. Make this plan with your doctor so it is ready to use. Activity  Exercise for 30 or more minutes a day, or as much as your doctor recommends.  Talk with your doctor before you start a new exercise or activity. Your doctor may need to tell you to change: ? How much insulin or medicines you take. ? How much food you eat. Lifestyle  Do not drink alcohol.  Do not use any tobacco products. These include cigarettes, chewing tobacco, and e-cigarettes. If you need help quitting, ask your doctor.  Learn how to deal with stress. If you need help with this, ask your doctor. Body care  Stay up to date with your shots (immunizations).  Brush your teeth and gums two times a day. Floss one or more times a day.  Go to the dentist one or more times every 6 months.  Stay at a healthy weight while you are pregnant. General instructions  Take over-the-counter and prescription medicines only as told by your doctor.  Ask your doctor about risks of high blood pressure in pregnancy (preeclampsia and eclampsia).  Share your diabetes care plan with: ? Your work or school. ? People you live with.  Check your pee for ketones: ? When you are sick. ? As told by your doctor.  Carry a card or  wear jewelry that says you have diabetes.  Keep all follow-up visits as told by your doctor. This is important. Care after giving birth  Have your blood sugar checked 4-12 weeks after you give birth.  Get checked for diabetes one or more times during 3 years. Questions to ask your doctor  Do I need to meet with a diabetes educator?  Where can I find a support group for people with gestational diabetes? Where to find more information To learn more about diabetes, visit:  American Diabetes Association: www.diabetes.org  Centers for Disease Control and Prevention (CDC): www.cdc.gov Summary  Check your blood sugar (glucose) every day while you are pregnant. Check it as often as told.  Take your insulin and diabetes medicines as told.  Keep all follow-up visits as   told by your doctor. This is important.  Have your blood sugar checked 4-12 weeks after you give birth. This information is not intended to replace advice given to you by your health care provider. Make sure you discuss any questions you have with your health care provider. Document Revised: 08/29/2017 Document Reviewed: 04/11/2015 Elsevier Patient Education  2020 [employer]  

## 2019-09-27 NOTE — Progress Notes (Signed)
Pt is here for ROB, [redacted]w[redacted]d. Pt reports fasting BG of 135 and after meals 140.

## 2019-09-27 NOTE — Progress Notes (Signed)
   PRENATAL VISIT NOTE  Subjective:  Sarah Mayo is a 29 y.o. G3P2002 at [redacted]w[redacted]d being seen today for ongoing prenatal care.  She is currently monitored for the following issues for this high-risk pregnancy and has BMI 37.0-37.9, adult; Sickle cell trait (HCC); Hemoglobin C trait (HCC); History of marijuana use; Supervision of high risk pregnancy in third trimester; Obesity during pregnancy, antepartum; Alpha thalassemia silent carrier; Gestational diabetes mellitus, antepartum; and LGA (large for gestational age) fetus affecting management of mother, third trimester on their problem list.  Patient reports no complaints.  Contractions: Not present. Vag. Bleeding: None.  Movement: Present. Denies leaking of fluid.   The following portions of the patient's history were reviewed and updated as appropriate: allergies, current medications, past family history, past medical history, past social history, past surgical history and problem list.   Objective:   Vitals:   09/27/19 1117  BP: 117/75  Pulse: (!) 109  Weight: 259 lb 12.8 oz (117.8 kg)    Fetal Status: Fetal Heart Rate (bpm): 150   Movement: Present     General:  Alert, oriented and cooperative. Patient is in no acute distress.  Skin: Skin is warm and dry. No rash noted.   Cardiovascular: Normal heart rate noted  Respiratory: Normal respiratory effort, no problems with respiration noted  Abdomen: Soft, gravid, appropriate for gestational age.  Pain/Pressure: Present     Pelvic: Cervical exam deferred        Extremities: Normal range of motion.  Edema: None  Mental Status: Normal mood and affect. Normal behavior. Normal judgment and thought content.   Assessment and Plan:  Pregnancy: G3P2002 at [redacted]w[redacted]d 1. Gestational diabetes mellitus (GDM) controlled on oral hypoglycemic drug, antepartum FBS 130s and PP 140 - metFORMIN (GLUCOPHAGE) 500 MG tablet; Take 1 tablet (500 mg total) by mouth at bedtime.  Dispense: 30 tablet; Refill:  2  2. LGA (large for gestational age) fetus affecting management of mother, third trimester [place] reviewed, f/u 32 weeks  3. Supervision of high risk pregnancy in third trimester   Preterm labor symptoms and general obstetric precautions including but not limited to vaginal bleeding, contractions, leaking of fluid and fetal movement were reviewed in detail with the patient. Please refer to After Visit Summary for other counseling recommendations.   Return in about 2 weeks (around 10/11/2019).  Future Appointments  Date Time Provider Department Center  10/16/2019 10:00 AM [facility] NURSE [facility]  10/16/2019 10:00 AM [id number]    Scheryl Darter, MD

## 2019-10-09 ENCOUNTER — Encounter (HOSPITAL_COMMUNITY): Payer: Self-pay | Admitting: Obstetrics and Gynecology

## 2019-10-09 ENCOUNTER — Ambulatory Visit
Admission: EM | Admit: 2019-10-09 | Discharge: 2019-10-09 | Disposition: A | Discharge status: Home / Self Care | Payer: Medicaid Other | Attending: Physician Assistant | Admitting: Physician Assistant

## 2019-10-09 ENCOUNTER — Other Ambulatory Visit: Payer: Self-pay

## 2019-10-09 ENCOUNTER — Inpatient Hospital Stay (HOSPITAL_COMMUNITY)
Admission: AD | Admit: 2019-10-09 | Discharge: 2019-10-09 | Disposition: A | Discharge status: Home / Self Care | Payer: Medicaid Other | Attending: Obstetrics and Gynecology | Admitting: Obstetrics and Gynecology

## 2019-10-09 DIAGNOSIS — R102 Pelvic and perineal pain: Secondary | ICD-10-CM

## 2019-10-09 DIAGNOSIS — Z7984 Long term (current) use of oral hypoglycemic drugs: Secondary | ICD-10-CM | POA: Diagnosis not present

## 2019-10-09 DIAGNOSIS — D573 Sickle-cell trait: Secondary | ICD-10-CM | POA: Insufficient documentation

## 2019-10-09 DIAGNOSIS — O26899 Other specified pregnancy related conditions, unspecified trimester: Secondary | ICD-10-CM

## 2019-10-09 DIAGNOSIS — Z7982 Long term (current) use of aspirin: Secondary | ICD-10-CM | POA: Insufficient documentation

## 2019-10-09 DIAGNOSIS — O26893 Other specified pregnancy related conditions, third trimester: Secondary | ICD-10-CM | POA: Insufficient documentation

## 2019-10-09 DIAGNOSIS — M545 Low back pain, unspecified: Secondary | ICD-10-CM

## 2019-10-09 DIAGNOSIS — O99013 Anemia complicating pregnancy, third trimester: Secondary | ICD-10-CM | POA: Insufficient documentation

## 2019-10-09 DIAGNOSIS — O3663X0 Maternal care for excessive fetal growth, third trimester, not applicable or unspecified: Secondary | ICD-10-CM | POA: Diagnosis not present

## 2019-10-09 DIAGNOSIS — R030 Elevated blood-pressure reading, without diagnosis of hypertension: Secondary | ICD-10-CM | POA: Insufficient documentation

## 2019-10-09 DIAGNOSIS — Z3A31 31 weeks gestation of pregnancy: Secondary | ICD-10-CM

## 2019-10-09 DIAGNOSIS — N764 Abscess of vulva: Secondary | ICD-10-CM

## 2019-10-09 DIAGNOSIS — Z3689 Encounter for other specified antenatal screening: Secondary | ICD-10-CM

## 2019-10-09 LAB — URINALYSIS, ROUTINE W REFLEX MICROSCOPIC
Bilirubin Urine: NEGATIVE
Glucose, UA: 50 mg/dL — AB
Ketones, ur: 5 mg/dL — AB
Nitrite: NEGATIVE
Protein, ur: 30 mg/dL — AB
Specific Gravity, Urine: 1.023 (ref 1.005–1.030)
pH: 5 (ref 5.0–8.0)

## 2019-10-09 LAB — CBC
HCT: 33.5 % — ABNORMAL LOW (ref 36.0–46.0)
Hemoglobin: 11.2 g/dL — ABNORMAL LOW (ref 12.0–15.0)
MCH: 25.7 pg — ABNORMAL LOW (ref 26.0–34.0)
MCHC: 33.4 g/dL (ref 30.0–36.0)
MCV: 76.8 fL — ABNORMAL LOW (ref 80.0–100.0)
Platelets: 270 10*3/uL (ref 150–400)
RBC: 4.36 MIL/uL (ref 3.87–5.11)
RDW: 14.9 % (ref 11.5–15.5)
WBC: 13.3 10*3/uL — ABNORMAL HIGH (ref 4.0–10.5)
nRBC: 0 % (ref 0.0–0.2)

## 2019-10-09 LAB — WET PREP, GENITAL
Sperm: NONE SEEN
Trich, Wet Prep: NONE SEEN
Yeast Wet Prep HPF POC: NONE SEEN

## 2019-10-09 LAB — COMPREHENSIVE METABOLIC PANEL
ALT: 28 U/L (ref 0–44)
AST: 18 U/L (ref 15–41)
Albumin: 2.7 g/dL — ABNORMAL LOW (ref 3.5–5.0)
Alkaline Phosphatase: 77 U/L (ref 38–126)
Anion gap: 8 (ref 5–15)
BUN: 5 mg/dL — ABNORMAL LOW (ref 6–20)
CO2: 20 mmol/L — ABNORMAL LOW (ref 22–32)
Calcium: 8.6 mg/dL — ABNORMAL LOW (ref 8.9–10.3)
Chloride: 108 mmol/L (ref 98–111)
Creatinine, Ser: 0.52 mg/dL (ref 0.44–1.00)
GFR calc Af Amer: >60 mL/min (ref >60)
GFR calc non Af Amer: >60 mL/min (ref >60)
Glucose, Bld: 169 mg/dL — ABNORMAL HIGH (ref 70–99)
Potassium: 3.9 mmol/L (ref 3.5–5.1)
Sodium: 136 mmol/L (ref 135–145)
Total Bilirubin: 0.5 mg/dL (ref 0.3–1.2)
Total Protein: 6.4 g/dL — ABNORMAL LOW (ref 6.5–8.1)

## 2019-10-09 LAB — PROTEIN / CREATININE RATIO, URINE
Creatinine, Urine: 251.26 mg/dL
Protein Creatinine Ratio: 0.21 mg/mg{Cre} — ABNORMAL HIGH (ref 0.00–0.15)
Total Protein, Urine: 54 mg/dL

## 2019-10-09 NOTE — Discharge Instructions (Signed)
Abscess drained. There is no surrounding skin infection, so no need for antibiotic pills for now. Keep area dressed as it will continue to drain. Change dressing at least once a day, more often as needed. Keep wound clean and dry. You can clean gently with soap and water. Do not soak area in water. Warm compress at least 10-15 mins at a time, at least 2-3 times a day. Monitor for spreading redness, increased warmth, increased swelling, fever, follow up for reevaluation needed.

## 2019-10-09 NOTE — MAU Provider Note (Signed)
History     CSN: [id number]  Arrival date and time: 10/09/19 1121   First Provider Initiated Contact with Patient 10/09/19 1237      Chief Complaint  Patient presents with  . vaginal pressure   Ms. NUSAYBA CADENAS is a 29 y.o. G3P2002 at 46w0dwho presents to MAU for pelvic pressure and low back discomfort. Patient reports she has a pregnancy support belt that she wears "here and there." Patient reports she purchased it from the prescription store. Patient has not been referred to pelvic physical therapy.  Incidentally, pt found to have elevated pressures on arrival to MAU with ?hx of cHTN.  Onset: three days ago Location: pelvis, low back Duration: three days Character: "I can't put my finger on it" Aggravating/Associated: standing up, walking, movement Relieving: lying down, sitting, resting Treatment: warm baths, Tylenol - did not work Severity: 0/10  Pt denies VB, LOF, ctx, decreased FM, vaginal discharge/odor/itching. Pt denies N/V, abdominal pain, constipation, diarrhea, or urinary problems. Pt denies fever, chills, fatigue, sweating or changes in appetite. Pt denies SOB or chest pain. Pt denies dizziness, HA, light-headedness, weakness.  Problems this pregnancy include: GDM, LGA. Allergies? NKDA Current medications/supplements? PNVs, bASA, metformin Prenatal care provider? [facility], next appt 10/10/2019   OB History    Gravida  3   Para  2   Term  2   Preterm      AB      Living  2     SAB      TAB      Ectopic      Multiple  0   Live Births  2           Past Medical History:  Diagnosis Date  . Gestational diabetes   . Sickle cell trait ([facility]     Past Surgical History:  Procedure Laterality Date  . NO PAST SURGERIES      Family History  Problem Relation Age of Onset  . Asthma Brother   . Sickle cell trait Mother     Social History   Tobacco Use  . Smoking status: Never Smoker  . Smokeless tobacco: Never Used  Vaping Use   . Vaping Use: Never used  Substance Use Topics  . Alcohol use: No    Comment: Social, Last drink August 2018  . Drug use: No    Types: Marijuana    Comment: quit with pregnancy    Allergies: No Known Allergies  Medications Prior to Admission  Medication Sig Dispense Refill Last Dose  . Accu-Chek Softclix Lancets lancets Use as instructed 100 each 12 10/08/2019 at Unknown time  . aspirin EC 81 MG tablet Take 1 tablet (81 mg total) by mouth daily. Take after 12 weeks for prevention of preeclampsia later in pregnancy. Start taking after 05/31/2019 300 tablet 0 10/08/2019 at Unknown time  . Blood Pressure Monitoring (BLOOD PRESSURE KIT) DEVI 1 kit by Does not apply route as needed. 1 each 0 10/08/2019 at Unknown time  . Elastic Bandages & Supports (COMFORT FIT MATERNITY SUPP SM) MISC 1 Units by Does not apply route daily as needed. 1 each 0 Past Month at Unknown time  . glucose blood (ACCU-CHEK GUIDE) test strip Use as instructed 100 each 12 10/08/2019 at Unknown time  . metFORMIN (GLUCOPHAGE) 500 MG tablet Take 1 tablet (500 mg total) by mouth at bedtime. 30 tablet 2 10/08/2019 at Unknown time  . Prenat-FeCbn-FeAsp-Meth-FA-DHA (PRENATE MINI) 18-0.6-0.4-350 MG CAPS Take 1 tablet by mouth daily.  60 capsule 7 10/08/2019 at Unknown time  . Prenat-Fe Poly-Methfol-FA-DHA (VITAFOL FE+) 90-0.6-0.4-200 MG CAPS Take 1 tablet by mouth daily. (Patient not taking: Reported on 09/27/2019) 30 capsule 12     Review of Systems  Constitutional: Negative for chills, diaphoresis, fatigue and fever.  Eyes: Negative for visual disturbance.  Respiratory: Negative for shortness of breath.   Cardiovascular: Negative for chest pain.  Gastrointestinal: Negative for abdominal pain, constipation, diarrhea, nausea and vomiting.  Genitourinary: Positive for pelvic pain (pt describes as pressure, not pain). Negative for dysuria, flank pain, frequency, urgency, vaginal bleeding and vaginal discharge.  Musculoskeletal: Positive  for back pain (low back).  Neurological: Negative for dizziness, weakness, light-headedness and headaches.   Physical Exam   Blood pressure 132/67, pulse (!) 116, temperature 98.3 F (36.8 C), temperature source Oral, resp. rate 15, height _0  (1.702 m), weight 117.5 kg, last menstrual period 03/01/2019, SpO2 100 %.  Patient Vitals for the past 24 hrs:  BP Temp Temp src Pulse Resp SpO2 Height Weight  10/09/19 1433 132/67 -- -- (!) 116 15 100 % -- --  10/09/19 1416 126/70 -- -- (!) 118 -- -- -- --  10/09/19 1346 136/71 -- -- (!) 111 -- -- -- --  10/09/19 1331 130/72 -- -- (!) 109 -- -- -- --  10/09/19 1316 (!) 124/106 -- -- (!) 102 -- -- -- --  10/09/19 1300 125/71 -- -- (!) 120 -- 98 % -- --  10/09/19 1246 129/79 -- -- (!) 113 -- -- -- --  10/09/19 1231 132/71 -- -- (!) 114 -- -- -- --  10/09/19 1216 132/75 -- -- (!) 114 -- -- -- --  10/09/19 1206 131/69 -- -- (!) 124 -- -- -- --  10/09/19 1157 -- -- -- -- -- -- _1  (1.702 m) 117.5 kg  10/09/19 1155 (!) 148/71 98.3 F (36.8 C) Oral (!) 111 16 99 % -- --   Physical Exam Exam conducted with a chaperone present.  Constitutional:      General: She is not in acute distress.    Appearance: She is well-developed. She is not diaphoretic.  HENT:     Head: Normocephalic and atraumatic.  Pulmonary:     Effort: Pulmonary effort is normal.  Abdominal:     Palpations: Abdomen is soft.  Genitourinary:    Vagina: No vaginal discharge.     Comments: Site of abscess drainage appears well-healing, no redness, warmth, drainage, tenderness, swelling noted. Skin:    General: Skin is warm and dry.  Neurological:     Mental Status: She is alert and oriented to person, place, and time.  Psychiatric:        Behavior: Behavior normal.        Thought Content: Thought content normal.        Judgment: Judgment normal.    Results for orders placed or performed during the hospital encounter of 10/09/19 (from the past 24 hour(s))  Urinalysis,  Routine w reflex microscopic     Status: Abnormal   Collection Time: 10/09/19 12:29 PM  Result Value Ref Range   Color, Urine AMBER (A) YELLOW   APPearance HAZY (A) CLEAR   Specific Gravity, Urine 1.023 1.005 - 1.030   pH 5.0 5.0 - 8.0   Glucose, UA 50 (A) NEGATIVE mg/dL   Hgb urine dipstick SMALL (A) NEGATIVE   Bilirubin Urine NEGATIVE NEGATIVE   Ketones, ur 5 (A) NEGATIVE mg/dL   Protein, ur 30 (A) NEGATIVE mg/dL  Nitrite NEGATIVE NEGATIVE   Leukocytes,Ua SMALL (A) NEGATIVE   RBC / HPF 6-10 0 - 5 RBC/hpf   WBC, UA 6-10 0 - 5 WBC/hpf   Bacteria, UA RARE (A) NONE SEEN   Squamous Epithelial / LPF 21-50 0 - 5   Mucus PRESENT   Protein / creatinine ratio, urine     Status: Abnormal   Collection Time: 10/09/19 12:39 PM  Result Value Ref Range   Creatinine, Urine 251.26 mg/dL   Total Protein, Urine 54 mg/dL   Protein Creatinine Ratio 0.21 (H) 0.00 - 0.15 mg/mg[Cre]  Wet prep, genital     Status: Abnormal   Collection Time: 10/09/19 12:52 PM   Specimen: Vaginal  Result Value Ref Range   Yeast Wet Prep HPF POC NONE SEEN NONE SEEN   Trich, Wet Prep NONE SEEN NONE SEEN   Clue Cells Wet Prep HPF POC PRESENT (A) NONE SEEN   WBC, Wet Prep HPF POC FEW (A) NONE SEEN   Sperm NONE SEEN   CBC     Status: Abnormal   Collection Time: 10/09/19  1:27 PM  Result Value Ref Range   WBC 13.3 (H) 4.0 - 10.5 K/uL   RBC 4.36 3.87 - 5.11 MIL/uL   Hemoglobin 11.2 (L) 12.0 - 15.0 g/dL   HCT 33.5 (L) 36 - 46 %   MCV 76.8 (L) 80.0 - 100.0 fL   MCH 25.7 (L) 26.0 - 34.0 pg   MCHC 33.4 30.0 - 36.0 g/dL   RDW 14.9 11.5 - 15.5 %   Platelets 270 150 - 400 K/uL   nRBC 0.0 0.0 - 0.2 %  Comprehensive metabolic panel     Status: Abnormal   Collection Time: 10/09/19  1:27 PM  Result Value Ref Range   Sodium 136 135 - 145 mmol/L   Potassium 3.9 3.5 - 5.1 mmol/L   Chloride 108 98 - 111 mmol/L   CO2 20 (L) 22 - 32 mmol/L   Glucose, Bld 169 (H) 70 - 99 mg/dL   BUN 5 (L) 6 - 20 mg/dL   Creatinine, Ser 0.52  0.44 - 1.00 mg/dL   Calcium 8.6 (L) 8.9 - 10.3 mg/dL   Total Protein 6.4 (L) 6.5 - 8.1 g/dL   Albumin 2.7 (L) 3.5 - 5.0 g/dL   AST 18 15 - 41 U/L   ALT 28 0 - 44 U/L   Alkaline Phosphatase 77 38 - 126 U/L   Total Bilirubin 0.5 0.3 - 1.2 mg/dL   GFR calc non Af Amer >60 >60 mL/min   GFR calc Af Amer >60 >60 mL/min   Anion gap 8 5 - 15   [place] MFM OB FOLLOW UP  Result Date: 09/18/2019 ----------------------------------------------------------------------  OBSTETRICS REPORT                       (Signed Final 09/18/2019 10:57 am) ---------------------------------------------------------------------- Patient Info  ID #:       [id number]                          D.O.B.:  07/27/1990 (29 yrs)  Name:       GENNIE EISINGER              Visit Date: 09/18/2019 09:46 am ---------------------------------------------------------------------- Performed By  Attending:        Johnell Comings MD         Ref. Address:     [address]  [address]                                                             [address] [place]                                                             [place]  Performed By:     Wilnette Kales        [facility]  Referred By:      [facility] ---------------------------------------------------------------------- Orders  #  Description                           Code        Ordered By  1  [place] MFM OB FOLLOW UP                   [phone]    YU FANG ----------------------------------------------------------------------  #  Order #                     Accession #                Episode #  1  [id number]                   [id number]                 [id number] ---------------------------------------------------------------------- Indications  Obesity complicating pregnancy, second         O99.212   trimester  Gestational diabetes in pregnancy, diet        O24.410  controlled  Encounter for other antenatal screening        Z36.2  follow-up (low risk NIPS, neg AFP)  History of sickle cell trait                   Z86.2  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 Cord Insertion:      Previously Visualized  Amniotic Fluid  AFI FV:      Within normal limits  AFI Sum(cm)     %Tile       Largest Pocket(cm)  12.4            31          4.5  RUQ(cm)       RLQ(cm)       LUQ(cm)        LLQ(cm)  4.5           1.8           2              4.1 ---------------------------------------------------------------------- Biometry  BPD:      76.6  mm     G. Age:  30w 5d         98  %    CI:        76.78   %    70 - 86                                                          FL/HC:      20.4   %    18.8 - 20.6  HC:      276.9  mm     G. Age:  30w 2d         86  %    HC/AC:      0.95        1.05 - 1.21  AC:      290.9  mm     G. Age:  33w 1d       > 99  %    FL/BPD:     73.9   %    71 - 87  FL:       56.6  mm     G. Age:  29w 5d         82  %    FL/AC:      19.5   %    20 - 24  Est. FW:    1801  gm           4 lb   > 99  % ---------------------------------------------------------------------- OB History  Gravidity:    3         Term:   2        Prem:   0        SAB:   0  TOP:          0       Ectopic:  0        Living: 2 ---------------------------------------------------------------------- Gestational Age  LMP:           28w 5d        Date:  03/01/19                 EDD:   12/06/19  U/S Today:     31w 0d  EDD:    11/20/19  Best:          Timothy Lasso 0d     Det. ByLoman Chroman         EDD:   12/11/19                                      (04/23/19) ---------------------------------------------------------------------- Anatomy  Cranium:               Appears normal         LVOT:                   Appears normal  Cavum:                 Appears normal         Aortic Arch:            Previously seen  Ventricles:            Appears normal         Ductal Arch:            Previously seen  Choroid Plexus:        Previously seen        Diaphragm:              Appears normal  Cerebellum:            Previously seen        Stomach:                Appears normal, left                                                                        sided  Posterior Fossa:       Previously seen        Abdomen:                Appears normal  Nuchal Fold:           Previously seen        Abdominal Wall:         Appears nml (cord                                                                        insert, abd wall)  Face:                  Orbits and profile     Cord Vessels:           Appears normal (3                         previously seen                                vessel cord)  Lips:                  Previously seen        Kidneys:                Bilateral UTD RT 9                                                                        mm LT 7 mm  Palate:                Previously seen        Bladder:                Appears normal  Thoracic:              Appears normal         Spine:                  Previously seen  Heart:                 Appears normal         Upper Extremities:      Previously seen                         (4CH, axis, and                         situs)  RVOT:                  Previously seen        Lower Extremities:      Previously seen  Other:  Heels/feet and open hands/5th digits previously visualized. Nasal          bone visualized. ---------------------------------------------------------------------- Cervix Uterus Adnexa   Cervix  Normal appearance by transabdominal scan.  Uterus  No abnormality visualized. ---------------------------------------------------------------------- Comments  This patient was seen for a follow up growth scan due to  bilateral pyelectasis that was noted during her prior  ultrasound exam.  She reports that she was recently  diagnosed with gestational diabetes.  She is scheduled for  diabetic teaching later this afternoon.  She was informed that the fetal growth continues to measure  at the 99th percentile for her gestational age.  There was  normal amniotic fluid noted today.  Bilateral pyelectasis measuring 0.9 cm on the right and 0.7  cm on the left continues to be noted today.  A normal-  appearing filled fetal bladder along with normal amniotic fluid  were noted today, indicating that at least one or both of the  fetal kidneys are functioning normally.  She was advised that  her baby will require evaluation after birth to determine if any  treatment for the bilateral pyelectasis noted today will be  necessary.  The implications and management of diabetes in pregnancy  was discussed in detail with the patient. She was advised that  our goals for her fingerstick values are fasting values of 90-95  or less and two-hour postprandials of 120 or less.  Should her  fingerstick values be above these values, she may have to be  started on  insulin or metformin to help her achieve better  glycemic control. The patient was advised that getting her  fingerstick values as close to these goals as possible would  provide her with the most optimal obstetrical outcome.  A follow-up growth scan was scheduled in 4 weeks.  Weekly  fetal testing will be started at that time should she require  insulin or Metformin for treatment of gestational diabetes. ----------------------------------------------------------------------                   Johnell Comings, MD Electronically Signed Final Report   09/18/2019 10:57 am  ----------------------------------------------------------------------   MAU Course  Procedures  MDM -pelvic pressure and low back discomfort without ctx Dilation: 1 Effacement (%): Thick Cervical Position: Posterior Exam by:: Vernice Jefferson, NP  -WetPrep: +ClueCells (isolated finding not requiring treatment)  -preeclampsia evaluation without severe range BP in MAU -elevated BP x2 -symptoms of PEC include: none -UA: amber/hazy/50GLU/sm hgb/5ketones/30PRO/sm leuks/rare bacteria, sending urine for culture -CBC: H/H 11.2/33.5, platelets 270 -CMP: serum creatinine 0.52, AST/ALT 18/28 -PCr: 0.21 -EFM: reactive       -baseline: 145       -variability: moderate       -accels: present, 15x15       -decels: absent       -TOCO: irritability -consulted with Dr. Elgie Congo, pt OK to wait until Thursday for BP check in office -pt discharged to home in stable condition   Orders Placed This Encounter  Procedures  . Wet prep, genital    Standing Status:   Standing    Number of Occurrences:   1  . Culture, OB Urine    Standing Status:   Standing    Number of Occurrences:   1  . Urinalysis, Routine w reflex microscopic    Standing Status:   Standing    Number of Occurrences:   1  . CBC    Standing Status:   Standing    Number of Occurrences:   1  . Comprehensive metabolic panel    Standing Status:   Standing    Number of Occurrences:   1  . Protein / creatinine ratio, urine    Standing Status:   Standing    Number of Occurrences:   1  . AMB referral to rehabilitation    Referral Priority:   Routine    Referral Type:   Consultation    Number of Visits Requested:   1  . Discharge patient    Order Specific Question:   Discharge disposition    Answer:   01-Home or Self Care [1]    Order Specific Question:   Discharge patient date    Answer:   10/09/2019   No orders of the defined types were placed in this encounter.   Assessment and Plan   1. Elevated blood pressure reading  without diagnosis of hypertension   2. Pelvic pressure in pregnancy   3. Low back pain during pregnancy in third trimester   4. [redacted] weeks gestation of pregnancy   5. NST (non-stress test) reactive     Allergies as of 10/09/2019   No Known Allergies     Medication List    TAKE these medications   Accu-Chek Guide test strip Generic drug: glucose blood Use as instructed   Accu-Chek Softclix Lancets lancets Use as instructed   aspirin EC 81 MG tablet Take 1 tablet (81 mg total) by mouth daily. Take after 12 weeks for prevention of preeclampsia later in pregnancy. Start  taking after 05/31/2019   Blood Pressure Kit Devi 1 kit by Does not apply route as needed.   Comfort Fit Maternity Supp Sm Misc 1 Units by Does not apply route daily as needed.   metFORMIN 500 MG tablet Commonly known as: Glucophage Take 1 tablet (500 mg total) by mouth at bedtime.   Prenate Mini 18-0.6-0.4-350 MG Caps Take 1 tablet by mouth daily.   Vitafol FE+ 90-0.6-0.4-200 MG Caps Take 1 tablet by mouth daily.      -encouraged increased use of pregnancy support belt -referral to pelvic PT -BP check two days in office, pt already has OB appt scheduled -discussed appropriate hydration in pregnancy given UA results -reviewed glucose monitoring and exercise for glucose control -Reviewed warning blood pressure values (systolic = / > 483 and/or diastolic =/> 90). Explained that, if blood pressure is elevated, she should sit down, rest, and eat/drink something. If still elevated 15 minutes later, she should call clinic or come to MAU. She should come to MAU if she has elevated pressures and any of the following:  - headache not relieved with tylenol, rest, hydration -blurry vision, floating spots in her vision - sudden full-body edema or facial edema -RUQ pain that is constant. These symptoms may indicate that her blood pressure is worsening and she may be developing gestational hypertension or pre-eclampsia,  which is an emergency. -preterm labor precautions given -return MAU precautions given -pt discharged to home in stable condition  Gerrie Nordmann Zhania Shaheen 10/09/2019, 3:15 PM

## 2019-10-09 NOTE — Discharge Instructions (Signed)
Preeclampsia and Eclampsia °Preeclampsia is a serious condition that may develop during pregnancy. This condition causes high blood pressure and increased protein in your urine along with other symptoms, such as headaches and vision changes. These symptoms may develop as the condition gets worse. Preeclampsia may occur at 20 weeks of pregnancy or later. °Diagnosing and treating preeclampsia early is very important. If not treated early, it can cause serious problems for you and your baby. One problem it can lead to is eclampsia. Eclampsia is a condition that causes muscle jerking or shaking (convulsions or seizures) and other serious problems for the mother. During pregnancy, delivering your baby may be the best treatment for preeclampsia or eclampsia. For most women, preeclampsia and eclampsia symptoms go away after giving birth. °In rare cases, a woman may develop preeclampsia after giving birth (postpartum preeclampsia). This usually occurs within 48 hours after childbirth but may occur up to 6 weeks after giving birth. °What are the causes? °The cause of preeclampsia is not known. °What increases the risk? °The following risk factors make you more likely to develop preeclampsia: °· Being pregnant for the first time. °· Having had preeclampsia during a past pregnancy. °· Having a family history of preeclampsia. °· Having high blood pressure. °· Being pregnant with more than one baby. °· Being 35 or older. °· Being African-American. °· Having kidney disease or diabetes. °· Having medical conditions such as lupus or blood diseases. °· Being very overweight (obese). °What are the signs or symptoms? °The most common symptoms are: °· Severe headaches. °· Vision problems, such as blurred or double vision. °· Abdominal pain, especially upper abdominal pain. °Other symptoms that may develop as the condition gets worse include: °· Sudden weight gain. °· Sudden swelling of the hands, face, legs, and feet. °· Severe nausea  and vomiting. °· Numbness in the face, arms, legs, and feet. °· Dizziness. °· Urinating less than usual. °· Slurred speech. °· Convulsions or seizures. °How is this diagnosed? °There are no screening tests for preeclampsia. Your health care provider will ask you about symptoms and check for signs of preeclampsia during your prenatal visits. You may also have tests that include: °· Checking your blood pressure. °· Urine tests to check for protein. Your health care provider will check for this at every prenatal visit. °· Blood tests. °· Monitoring your baby's heart rate. °· Ultrasound. °How is this treated? °You and your health care provider will determine the treatment approach that is best for you. Treatment may include: °· Having more frequent prenatal exams to check for signs of preeclampsia, if you have an increased risk for preeclampsia. °· Medicine to lower your blood pressure. °· Staying in the hospital, if your condition is severe. There, treatment will focus on controlling your blood pressure and the amount of fluids in your body (fluid retention). °· Taking medicine (magnesium sulfate) to prevent seizures. This may be given as an injection or through an IV. °· Taking a low-dose aspirin during your pregnancy. °· Delivering your baby early. You may have your labor started with medicine (induced), or you may have a cesarean delivery. °Follow these instructions at home: °Eating and drinking ° °· Drink enough fluid to keep your urine pale yellow. °· Avoid caffeine. °Lifestyle °· Do not use any products that contain nicotine or tobacco, such as cigarettes and e-cigarettes. If you need help quitting, ask your health care provider. °· Do not use alcohol or drugs. °· Avoid stress as much as possible. Rest and get   plenty of sleep. General instructions  Take over-the-counter and prescription medicines only as told by your health care provider.  When lying down, lie on your left side. This keeps pressure off your  major blood vessels.  When sitting or lying down, raise (elevate) your feet. Try putting some pillows underneath your lower legs.  Exercise regularly. Ask your health care provider what kinds of exercise are best for you.  Keep all follow-up and prenatal visits as told by your health care provider. This is important. How is this prevented? There is no known way of preventing preeclampsia or eclampsia from developing. However, to lower your risk of complications and detect problems early:  Get regular prenatal care. Your health care provider may be able to diagnose and treat the condition early.  Maintain a healthy weight. Ask your health care provider for help managing weight gain during pregnancy.  Work with your health care provider to manage any long-term (chronic) health conditions you have, such as diabetes or kidney problems.  You may have tests of your blood pressure and kidney function after giving birth.  Your health care provider may have you take low-dose aspirin during your next pregnancy. Contact a health care provider if:  You have symptoms that your health care provider told you may require more treatment or monitoring, such as: ? Headaches. ? Nausea or vomiting. ? Abdominal pain. ? Dizziness. ? Light-headedness. Get help right away if:  You have severe: ? Abdominal pain. ? Headaches that do not get better. ? Dizziness. ? Vision problems. ? Confusion. ? Nausea or vomiting.  You have any of the following: ? A seizure. ? Sudden, rapid weight gain. ? Sudden swelling in your hands, ankles, or face. ? Trouble moving any part of your body. ? Numbness in any part of your body. ? Trouble speaking. ? Abnormal bleeding.  You faint. Summary  Preeclampsia is a serious condition that may develop during pregnancy.  This condition causes high blood pressure and increased protein in your urine along with other symptoms, such as headaches and vision  changes.  Diagnosing and treating preeclampsia early is very important. If not treated early, it can cause serious problems for you and your baby.  Get help right away if you have symptoms that your health care provider told you to watch for. This information is not intended to replace advice given to you by your health care provider. Make sure you discuss any questions you have with your health care provider. Document Revised: 11/08/2017 Document Reviewed: 10/13/2015 Elsevier Patient Education  [employer].        Preterm Labor and Birth Information  The normal length of a pregnancy is 39-41 weeks. Preterm labor is when labor starts before 37 completed weeks of pregnancy. What are the risk factors for preterm labor? Preterm labor is more likely to occur in women who:  Have certain infections during pregnancy such as a bladder infection, sexually transmitted infection, or infection inside the uterus (chorioamnionitis).  Have a shorter-than-normal cervix.  Have gone into preterm labor before.  Have had surgery on their cervix.  Are younger than age 30 or older than age 65.  Are African American.  Are pregnant with twins or multiple babies (multiple gestation).  Take street drugs or smoke while pregnant.  Do not gain enough weight while pregnant.  Became pregnant shortly after having been pregnant. What are the symptoms of preterm labor? Symptoms of preterm labor include:  Cramps similar to those that can happen  during a menstrual period. The cramps may happen with diarrhea.  Pain in the abdomen or lower back.  Regular uterine contractions that may feel like tightening of the abdomen.  A feeling of increased pressure in the pelvis.  Increased watery or bloody mucus discharge from the vagina.  Water breaking (ruptured amniotic sac). Why is it important to recognize signs of preterm labor? It is important to recognize signs of preterm labor because babies who  are born prematurely may not be fully developed. This can put them at an increased risk for:  Long-term (chronic) heart and lung problems.  Difficulty immediately after birth with regulating body systems, including blood sugar, body temperature, heart rate, and breathing rate.  Bleeding in the brain.  Cerebral palsy.  Learning difficulties.  Death. These risks are highest for babies who are born before 34 weeks of pregnancy. How is preterm labor treated? Treatment depends on the length of your pregnancy, your condition, and the health of your baby. It may involve:  Having a stitch (suture) placed in your cervix to prevent your cervix from opening too early (cerclage).  Taking or being given medicines, such as: ? Hormone medicines. These may be given early in pregnancy to help support the pregnancy. ? Medicine to stop contractions. ? Medicines to help mature the babys lungs. These may be prescribed if the risk of delivery is high. ? Medicines to prevent your baby from developing cerebral palsy. If the labor happens before 34 weeks of pregnancy, you may need to stay in the hospital. What should I do if I think I am in preterm labor? If you think that you are going into preterm labor, call your health care provider right away. How can I prevent preterm labor in future pregnancies? To increase your chance of having a full-term pregnancy:  Do not use any tobacco products, such as cigarettes, chewing tobacco, and e-cigarettes. If you need help quitting, ask your health care provider.  Do not use street drugs or medicines that have not been prescribed to you during your pregnancy.  Talk with your health care provider before taking any herbal supplements, even if you have been taking them regularly.  Make sure you gain a healthy amount of weight during your pregnancy.  Watch for infection. If you think that you might have an infection, get it checked right away.  Make sure to tell  your health care provider if you have gone into preterm labor before. This information is not intended to replace advice given to you by your health care provider. Make sure you discuss any questions you have with your health care provider. Document Revised: 06/30/2018 Document Reviewed: 07/30/2015 Elsevier Patient Education  2020 Else[employer] Inc.         Abdominal Pain During Pregnancy  Abdominal pain is common during pregnancy, and has many possible causes. Some causes are more serious than others, and sometimes the cause is not known. Abdominal pain can be a sign that labor is starting. It can also be caused by normal growth and stretching of muscles and ligaments during pregnancy. Always tell your health care provider if you have any abdominal pain. Follow these instructions at home:  Do not have sex or put anything in your vagina until your pain goes away completely.  Get plenty of rest until your pain improves.  Drink enough fluid to keep your urine pale yellow.  Take over-the-counter and prescription medicines only as told by your health care provider.  Keep all follow-up visits  as told by your health care provider. This is important. Contact a health care provider if:  Your pain continues or gets worse after resting.  You have lower abdominal pain that: ? Comes and goes at regular intervals. ? Spreads to your back. ? Is similar to menstrual cramps.  You have pain or burning when you urinate. Get help right away if:  You have a fever or chills.  You have vaginal bleeding.  You are leaking fluid from your vagina.  You are passing tissue from your vagina.  You have vomiting or diarrhea that lasts for more than 24 hours.  Your baby is moving less than usual.  You feel very weak or faint.  You have shortness of breath.  You develop severe pain in your upper abdomen. Summary  Abdominal pain is common during pregnancy, and has many possible causes.  If you  experience abdominal pain during pregnancy, tell your health care provider right away.  Follow your health care provider's home care instructions and keep all follow-up visits as directed. This information is not intended to replace advice given to you by your health care provider. Make sure you discuss any questions you have with your health care provider. Document Revised: 06/26/2018 Document Reviewed: 06/10/2016 Elsevier Patient Education  2020 Elsevier Inc.        Back Pain in Pregnancy Back pain during pregnancy is common. Back pain may be caused by several factors that are related to changes during your pregnancy. Follow these instructions at home: Managing pain, stiffness, and swelling      If directed, for sudden (acute) back pain, put ice on the painful area. ? Put ice in a plastic bag. ? Place a towel between your skin and the bag. ? Leave the ice on for 20 minutes, 2-3 times per day.  If directed, apply heat to the affected area before you exercise. Use the heat source that your health care provider recommends, such as a moist heat pack or a heating pad. ? Place a towel between your skin and the heat source. ? Leave the heat on for 20-30 minutes. ? Remove the heat if your skin turns bright red. This is especially important if you are unable to feel pain, heat, or cold. You may have a greater risk of getting burned.  If directed, massage the affected area. Activity  Exercise as told by your health care provider. Gentle exercise is the best way to prevent or manage back pain.  Listen to your body when lifting. If lifting hurts, ask for help or bend your knees. This uses your leg muscles instead of your back muscles.  Squat down when picking up something from the floor. Do not bend over.  Only use bed rest for short periods as told by your health care provider. Bed rest should only be used for the most severe episodes of back pain. Standing, sitting, and lying  down  Do not stand in one place for long periods of time.  Use good posture when sitting. Make sure your head rests over your shoulders and is not hanging forward. Use a pillow on your lower back if necessary.  Try sleeping on your side, preferably the left side, with a pregnancy support pillow or 1-2 regular pillows between your legs. ? If you have back pain after a night's rest, your bed may be too soft. ? A firm mattress may provide more support for your back during pregnancy. General instructions  Do not wear high heels.  Eat a healthy diet. Try to gain weight within your health care provider's recommendations.  Use a maternity girdle, elastic sling, or back brace as told by your health care provider.  Take over-the-counter and prescription medicines only as told by your health care provider.  Work with a physical therapist or massage therapist to find ways to manage back pain. Acupuncture or massage therapy may be helpful.  Keep all follow-up visits as told by your health care provider. This is important. Contact a health care provider if:  Your back pain interferes with your daily activities.  You have increasing pain in other parts of your body. Get help right away if:  You develop numbness, tingling, weakness, or problems with the use of your arms or legs.  You develop severe back pain that is not controlled with medicine.  You have a change in bowel or bladder control.  You develop shortness of breath, dizziness, or you faint.  You develop nausea, vomiting, or sweating.  You have back pain that is a rhythmic, cramping pain similar to labor pains. Labor pain is usually 1-2 minutes apart, lasts for about 1 minute, and involves a bearing down feeling or pressure in your pelvis.  You have back pain and your water breaks or you have vaginal bleeding.  You have back pain or numbness that travels down your leg.  Your back pain developed after you fell.  You develop  pain on one side of your back.  You see blood in your urine.  You develop skin blisters in the area of your back pain. Summary  Back pain may be caused by several factors that are related to changes during your pregnancy.  Follow instructions as told by your health care provider for managing pain, stiffness, and swelling.  Exercise as told by your health care provider. Gentle exercise is the best way to prevent or manage back pain.  Take over-the-counter and prescription medicines only as told by your health care provider.  Keep all follow-up visits as told by your health care provider. This is important. This information is not intended to replace advice given to you by your health care provider. Make sure you discuss any questions you have with your health care provider. Document Revised: 06/27/2018 Document Reviewed: 08/24/2017 Elsevier Patient Education  2020 [employer].

## 2019-10-09 NOTE — MAU Note (Signed)
.  Sarah Mayo is a 29 y.o. at [redacted]w[redacted]d here in MAU reporting: prevents with vaginal pressure, states she had a cyst drained on her left labia earlier today at urgent care. Denies any VB  Onset of complaint: this morning Pain score: 0 There were no vitals filed for this visit.   FHT:146 Lab orders placed from triage: UA

## 2019-10-09 NOTE — ED Provider Notes (Signed)
[name]    CSN: [id number] Arrival date & time: 10/09/19  1013      History   Chief Complaint Chief Complaint  Patient presents with  . Abscess    HPI Sarah Mayo is a 29 y.o. female.   29 year old female who is [redacted] weeks pregnant comes in for 2 day history of left labial pain with possible abscess. Recently shaved prior to symptoms starting. No fevers. Has felt area getting larger. Has been doing warm compresses without relief.   Good fetal movement. No abdominal pain, vaginal bleeding.      Past Medical History:  Diagnosis Date  . Gestational diabetes   . Sickle cell trait [facility])     Patient Active Problem List   Diagnosis Date Noted  . Gestational diabetes mellitus, antepartum 09/14/2019  . LGA (large for gestational age) fetus affecting management of mother, third trimester 09/14/2019  . Alpha thalassemia silent carrier 06/11/2019  . Obesity during pregnancy, antepartum 05/24/2019  . Supervision of high risk pregnancy in third trimester 05/23/2019  . BMI 37.0-37.9, adult 07/23/2014  . Sickle cell trait ([place]) 07/23/2014  . 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 History of marijuana use 07/23/2014    Past Surgical History:  Procedure Laterality Date  . NO PAST SURGERIES      OB History    Gravida  3   Para  2   Term  2   Preterm      AB      Living  2     SAB      TAB      Ectopic      Multiple  0   Live Births  2            Home Medications    Prior to Admission medications   Medication Sig Start Date End Date Taking? Authorizing Provider  Accu-Chek Softclix Lancets lancets Use as instructed 09/18/19   Sparacino, Hailey L, DO  aspirin EC 81 MG tablet Take 1 tablet (81 mg total) by mouth daily. Take after 12 weeks for prevention of preeclampsia later in pregnancy. Start taking after 05/31/2019 05/31/19   Lajean Manes, CNM  Blood Pressure Monitoring (BLOOD PRESSURE KIT) DEVI 1 kit by Does not apply route  as needed. 06/01/19   Lajean Manes, CNM  Elastic Bandages & Supports (COMFORT FIT MATERNITY SUPP SM) MISC 1 Units by Does not apply route daily as needed. 08/30/19   Nugent, Gerrie Nordmann, NP  glucose blood (ACCU-CHEK GUIDE) test strip Use as instructed 09/18/19   Sparacino, Hailey L, DO  metFORMIN (GLUCOPHAGE) 500 MG tablet Take 1 tablet (500 mg total) by mouth at bedtime. 09/27/19   Woodroe Mode, MD  Prenat-Fe Poly-Methfol-FA-DHA (VITAFOL FE+) 90-0.6-0.4-200 MG CAPS Take 1 tablet by mouth daily. Patient not taking: Reported on 09/27/2019 06/01/19   Lajean Manes, CNM  Prenat-FeCbn-FeAsp-Meth-FA-DHA (PRENATE MINI) 18-0.6-0.4-350 MG CAPS Take 1 tablet by mouth daily. 05/24/19   Lajean Manes, CNM  drospirenone-ethinyl estradiol (YASMIN 28) 3-0.03 MG tablet Take 1 tablet by mouth daily. Patient not taking: Reported on 02/12/2019 06/06/17 04/07/19  Woodroe Mode, MD  fluticasone [facility]) 50 MCG/ACT nasal spray Place 2 sprays into both nostrils daily. Patient not taking: Reported on 02/12/2019 06/13/18 04/07/19  Lestine Box, PA-C    Family History Family History  Problem Relation Age of Onset  . Asthma Brother   . Sickle cell trait Mother     Social  History Social History   Tobacco Use  . Smoking status: Never Smoker  . Smokeless tobacco: Never Used  Vaping Use  . Vaping Use: Never used  Substance Use Topics  . Alcohol use: No    Comment: Social, Last drink August 2018  . Drug use: No    Types: Marijuana    Comment: quit with pregnancy     Allergies   Patient has no known allergies.   Review of Systems Review of Systems  Reason unable to perform ROS: See HPI as above.     Physical Exam Triage Vital Signs ED Triage Vitals  Enc Vitals Group     BP 10/09/19 1024 111/60     Pulse Rate 10/09/19 1024 (!) 107     Resp 10/09/19 1024 18     Temp 10/09/19 1024 98.4 F (36.9 C)     Temp src --      SpO2 10/09/19 1024 96 %     Weight --      Height --      Head  Circumference --      Peak Flow --      Pain Score 10/09/19 1029 6     Pain Loc --      Pain Edu? --      Excl. in [place]? --    No data found.  Updated Vital Signs BP 111/60 (BP Location: Left Arm)   Pulse (!) 107   Temp 98.4 F (36.9 C)   Resp 18   LMP 03/01/2019   SpO2 96%   Visual Acuity Right Eye Distance:   Left Eye Distance:   Bilateral Distance:    Right Eye Near:   Left Eye Near:    Bilateral Near:     Physical Exam Exam conducted with a chaperone present.  Constitutional:      General: She is not in acute distress.    Appearance: Normal appearance. She is well-developed. She is not toxic-appearing or diaphoretic.  HENT:     Head: Normocephalic and atraumatic.  Eyes:     Conjunctiva/sclera: Conjunctivae normal.     Pupils: Pupils are equal, round, and reactive to light.  Pulmonary:     Effort: Pulmonary effort is normal. No respiratory distress.  Genitourinary:   Musculoskeletal:     Cervical back: Normal range of motion and neck supple.  Skin:    General: Skin is warm and dry.  Neurological:     Mental Status: She is alert and oriented to person, place, and time.      UC Treatments / Results  Labs (all labs ordered are listed, but only abnormal results are displayed) Labs Reviewed - No data to display  EKG   Radiology No results found.  Procedures Incision and Drainage  Date/Time: 10/09/2019 11:19 AM Performed by: Ok Edwards, PA-C Authorized by: Ok Edwards, PA-C   Consent:    Consent obtained:  Verbal   Consent given by:  Patient   Risks discussed:  Bleeding, incomplete drainage, pain, infection and damage to other organs   Alternatives discussed:  Alternative treatment and referral Location:    Type:  Abscess   Size:  1cm x 1cm   Location:  Anogenital   Anogenital location:  [place] Pre-procedure details:    Skin preparation:  Chloraprep Anesthesia (see MAR for exact dosages):    Anesthesia method:  Local infiltration   Local  anesthetic:  Lidocaine 1% WITH epi Procedure type:    Complexity:  Simple Procedure  details:    Needle aspiration: no     Incision types:  Single straight   Incision depth:  Dermal   Scalpel blade:  11   Drainage:  Purulent   Drainage amount:  Moderate   Wound treatment:  Wound left open Post-procedure details:    Patient tolerance of procedure:  Tolerated well, no immediate complications   (including critical care time)  Medications Ordered in UC Medications - No data to display  Initial Impression / Assessment and Plan / UC Course  I have reviewed the triage vital signs and the nursing notes.  Pertinent labs & imaging results that were available during my care of the patient were reviewed by me and considered in my medical decision making (see chart for details).    Patient tolerated procedure well. No surrounding cellulitis. Abscess was able to be drained well, will defer oral abx for now. Wound care instructions given. Return precautions given. Patient expresses understanding and agrees to plan.   Final Clinical Impressions(s) / UC Diagnoses   Final diagnoses:  Labial abscess    ED Prescriptions    None     PDMP not reviewed this encounter.   Ok Edwards, PA-C 10/09/19 1120

## 2019-10-09 NOTE — ED Triage Notes (Signed)
Patient is here today with complaints of an abscess on her left labia that began about two days ago. Patient states she shaved about 5-7 days prior to the abscess. Patient states she has been applying warm compresses on/off for the pass two days with no relief.

## 2019-10-10 LAB — GC/CHLAMYDIA PROBE AMP (~~LOC~~) NOT AT ARMC
Chlamydia: NEGATIVE
Comment: NEGATIVE
Comment: NORMAL
Neisseria Gonorrhea: NEGATIVE

## 2019-10-10 LAB — CULTURE, OB URINE

## 2019-10-11 ENCOUNTER — Other Ambulatory Visit: Payer: Self-pay

## 2019-10-11 ENCOUNTER — Encounter: Payer: Self-pay | Admitting: Obstetrics

## 2019-10-11 ENCOUNTER — Ambulatory Visit (INDEPENDENT_AMBULATORY_CARE_PROVIDER_SITE_OTHER): Payer: Medicaid Other | Admitting: Obstetrics

## 2019-10-11 ENCOUNTER — Encounter: Payer: Medicaid Other | Admitting: Obstetrics and Gynecology

## 2019-10-11 VITALS — BP 117/79 | HR 102 | Wt 258.0 lb

## 2019-10-11 DIAGNOSIS — O0993 Supervision of high risk pregnancy, unspecified, third trimester: Secondary | ICD-10-CM

## 2019-10-11 DIAGNOSIS — O99210 Obesity complicating pregnancy, unspecified trimester: Secondary | ICD-10-CM

## 2019-10-11 DIAGNOSIS — Z3A31 31 weeks gestation of pregnancy: Secondary | ICD-10-CM

## 2019-10-11 DIAGNOSIS — O3663X0 Maternal care for excessive fetal growth, third trimester, not applicable or unspecified: Secondary | ICD-10-CM

## 2019-10-11 DIAGNOSIS — E669 Obesity, unspecified: Secondary | ICD-10-CM

## 2019-10-11 DIAGNOSIS — O99213 Obesity complicating pregnancy, third trimester: Secondary | ICD-10-CM

## 2019-10-11 DIAGNOSIS — Z302 Encounter for sterilization: Secondary | ICD-10-CM

## 2019-10-11 DIAGNOSIS — Z3009 Encounter for other general counseling and advice on contraception: Secondary | ICD-10-CM

## 2019-10-11 DIAGNOSIS — O24415 Gestational diabetes mellitus in pregnancy, controlled by oral hypoglycemic drugs: Secondary | ICD-10-CM

## 2019-10-11 MED ORDER — METFORMIN HCL ER 500 MG PO TB24: 1000.0000 mg | ORAL_TABLET | Freq: Two times a day (BID) | ORAL | 11 refills | Status: DC

## 2019-10-11 NOTE — Progress Notes (Signed)
Pt reports fetal movement with pressure. Pt reports fasting BG today was 123

## 2019-10-11 NOTE — Progress Notes (Signed)
Subjective:  Sarah Mayo is a 29 y.o. G3P2002 at [redacted]w[redacted]d being seen today for ongoing prenatal care.  She is currently monitored for the following issues for this high-risk pregnancy and has BMI 37.0-37.9, adult; Sickle cell trait (HCC); Hemoglobin C trait (HCC); History of marijuana use; Supervision of high risk pregnancy in third trimester; Obesity during pregnancy, antepartum; Alpha thalassemia silent carrier; Gestational diabetes mellitus, antepartum; and LGA (large for gestational age) fetus affecting management of mother, third trimester on their problem list.  Patient reports heartburn.  Contractions: Not present. Vag. Bleeding: None.  Movement: Present. Denies leaking of fluid.   The following portions of the patient's history were reviewed and updated as appropriate: allergies, current medications, past family history, past medical history, past social history, past surgical history and problem list. Problem list updated.  Objective:   Vitals:   10/11/19 1334  BP: 117/79  Pulse: 102  Weight: (!) 258 lb (117 kg)    Fetal Status:     Movement: Present     General:  Alert, oriented and cooperative. Patient is in no acute distress.  Skin: Skin is warm and dry. No rash noted.   Cardiovascular: Normal heart rate noted  Respiratory: Normal respiratory effort, no problems with respiration noted  Abdomen: Soft, gravid, appropriate for gestational age. Pain/Pressure: Present     Pelvic:  Cervical exam deferred        Extremities: Normal range of motion.  Edema: None  Mental Status: Normal mood and affect. Normal behavior. Normal judgment and thought content.   Urinalysis:      Assessment and Plan:  Pregnancy: G3P2002 at [redacted]w[redacted]d  1. Supervision of high risk pregnancy in third trimester  2. Gestational diabetes mellitus (GDM) controlled on oral hypoglycemic drug, antepartum - blood sugars not well controlled.  FBS = 123 today - Metformin increased to 1000 mg po bid Rx: -  metFORMIN (GLUCOPHAGE XR) 500 MG 24 hr tablet; Take 2 tablets (1,000 mg total) by mouth 2 (two) times daily after a meal. Take after breakfast and after supper.  Dispense: 120 tablet; Refill: 11  3. LGA (large for gestational age) fetus affecting management of mother, third trimester - ultrasounds for interval growth q 3-4 weeks  4. Obesity during pregnancy, antepartum - Baby ASA Rx  5. Request for sterilization - tubal papers signed 09-13-2019   Preterm labor symptoms and general obstetric precautions including but not limited to vaginal bleeding, contractions, leaking of fluid and fetal movement were reviewed in detail with the patient. Please refer to After Visit Summary for other counseling recommendations.   Return in about 2 weeks (around 10/25/2019) for MyChart HOB-Faculty Only.   Brock Bad, MD  10/11/19

## 2019-10-15 ENCOUNTER — Telehealth: Payer: Self-pay | Admitting: *Deleted

## 2019-10-15 NOTE — Telephone Encounter (Signed)
Spoke with pt as she had questions about partner testing with [employer].  Pt states they are being billed [phone] for partner testing and did not expect this expense or they would not have completed test.   Pt made aware that concerns would be sent to [facility] with Avelina Laine and she will be in contact with her regarding bill.  Advised pt to contact office later this week if she does not get a response.

## 2019-10-16 ENCOUNTER — Other Ambulatory Visit: Payer: Self-pay | Admitting: Obstetrics

## 2019-10-16 ENCOUNTER — Other Ambulatory Visit: Payer: Self-pay

## 2019-10-16 ENCOUNTER — Other Ambulatory Visit: Payer: Self-pay | Admitting: *Deleted

## 2019-10-16 ENCOUNTER — Ambulatory Visit: Payer: Medicaid Other | Admitting: *Deleted

## 2019-10-16 ENCOUNTER — Ambulatory Visit: Payer: Medicaid Other | Attending: Obstetrics and Gynecology

## 2019-10-16 VITALS — BP 121/56 | HR 97

## 2019-10-16 DIAGNOSIS — O24415 Gestational diabetes mellitus in pregnancy, controlled by oral hypoglycemic drugs: Secondary | ICD-10-CM | POA: Diagnosis not present

## 2019-10-16 DIAGNOSIS — O99213 Obesity complicating pregnancy, third trimester: Secondary | ICD-10-CM | POA: Diagnosis not present

## 2019-10-16 DIAGNOSIS — E669 Obesity, unspecified: Secondary | ICD-10-CM | POA: Diagnosis not present

## 2019-10-16 DIAGNOSIS — D573 Sickle-cell trait: Secondary | ICD-10-CM

## 2019-10-16 DIAGNOSIS — O358XX0 Maternal care for other (suspected) fetal abnormality and damage, not applicable or unspecified: Secondary | ICD-10-CM

## 2019-10-16 DIAGNOSIS — O35EXX0 Maternal care for other (suspected) fetal abnormality and damage, fetal genitourinary anomalies, not applicable or unspecified: Secondary | ICD-10-CM

## 2019-10-16 DIAGNOSIS — O09293 Supervision of pregnancy with other poor reproductive or obstetric history, third trimester: Secondary | ICD-10-CM | POA: Diagnosis not present

## 2019-10-16 DIAGNOSIS — O99210 Obesity complicating pregnancy, unspecified trimester: Secondary | ICD-10-CM

## 2019-10-16 DIAGNOSIS — D563 Thalassemia minor: Secondary | ICD-10-CM | POA: Insufficient documentation

## 2019-10-16 DIAGNOSIS — Z3A32 32 weeks gestation of pregnancy: Secondary | ICD-10-CM

## 2019-10-16 DIAGNOSIS — Z362 Encounter for other antenatal screening follow-up: Secondary | ICD-10-CM | POA: Diagnosis not present

## 2019-10-16 DIAGNOSIS — Z148 Genetic carrier of other disease: Secondary | ICD-10-CM

## 2019-10-24 ENCOUNTER — Encounter: Payer: Self-pay | Admitting: *Deleted

## 2019-10-24 ENCOUNTER — Ambulatory Visit: Payer: Medicaid Other | Attending: Obstetrics and Gynecology

## 2019-10-24 ENCOUNTER — Other Ambulatory Visit: Payer: Self-pay

## 2019-10-24 ENCOUNTER — Ambulatory Visit: Payer: Medicaid Other | Admitting: *Deleted

## 2019-10-24 DIAGNOSIS — Z7984 Long term (current) use of oral hypoglycemic drugs: Secondary | ICD-10-CM

## 2019-10-24 DIAGNOSIS — D563 Thalassemia minor: Secondary | ICD-10-CM | POA: Diagnosis not present

## 2019-10-24 DIAGNOSIS — O99213 Obesity complicating pregnancy, third trimester: Secondary | ICD-10-CM | POA: Diagnosis not present

## 2019-10-24 DIAGNOSIS — O24415 Gestational diabetes mellitus in pregnancy, controlled by oral hypoglycemic drugs: Secondary | ICD-10-CM | POA: Insufficient documentation

## 2019-10-24 DIAGNOSIS — O99210 Obesity complicating pregnancy, unspecified trimester: Secondary | ICD-10-CM | POA: Diagnosis not present

## 2019-10-24 DIAGNOSIS — E669 Obesity, unspecified: Secondary | ICD-10-CM

## 2019-10-24 DIAGNOSIS — D573 Sickle-cell trait: Secondary | ICD-10-CM

## 2019-10-24 DIAGNOSIS — O09293 Supervision of pregnancy with other poor reproductive or obstetric history, third trimester: Secondary | ICD-10-CM | POA: Diagnosis not present

## 2019-10-24 DIAGNOSIS — Z148 Genetic carrier of other disease: Secondary | ICD-10-CM

## 2019-10-24 DIAGNOSIS — Z3A33 33 weeks gestation of pregnancy: Secondary | ICD-10-CM

## 2019-10-24 NOTE — Progress Notes (Signed)
Pt states Dr. Clearance Coots increased her dose of Metformin. Pt states she is still taking the lower dose because she does not want to take that much medicine, yet her blood sugars continue to be high. Encouraged pt to comply with increased dose as elevated blood sugars can be detrimental to her and her baby. Informed pt that the next step would be Insulin. Pt states she will consider.

## 2019-10-25 ENCOUNTER — Encounter: Payer: Self-pay | Admitting: Obstetrics & Gynecology

## 2019-10-25 ENCOUNTER — Telehealth (INDEPENDENT_AMBULATORY_CARE_PROVIDER_SITE_OTHER): Payer: Medicaid Other | Admitting: Obstetrics & Gynecology

## 2019-10-25 DIAGNOSIS — Z3A27 27 weeks gestation of pregnancy: Secondary | ICD-10-CM

## 2019-10-25 DIAGNOSIS — D563 Thalassemia minor: Secondary | ICD-10-CM

## 2019-10-25 DIAGNOSIS — O24415 Gestational diabetes mellitus in pregnancy, controlled by oral hypoglycemic drugs: Secondary | ICD-10-CM | POA: Diagnosis not present

## 2019-10-25 DIAGNOSIS — E669 Obesity, unspecified: Secondary | ICD-10-CM | POA: Diagnosis not present

## 2019-10-25 DIAGNOSIS — O99210 Obesity complicating pregnancy, unspecified trimester: Secondary | ICD-10-CM | POA: Diagnosis not present

## 2019-10-25 NOTE — Progress Notes (Signed)
TELEHEALTH OBSTETRICS VISIT ENCOUNTER NOTE  I connected with [name] on 10/25/19 at  9:15 AM EDT by telephone at home and verified that I am speaking with the correct person using two identifiers.   I discussed the limitations, risks, security and privacy concerns of performing an evaluation and management service by telephone and the availability of in person appointments. I also discussed with the patient that there may be a patient responsible charge related to this service. The patient expressed understanding and agreed to proceed.  Subjective:  Sarah Mayo is a 29 y.o. G3P2002 at [redacted]w[redacted]d being followed for ongoing prenatal care.  She is currently monitored for the following issues for this high-risk pregnancy and has BMI 37.0-37.9, adult; Sickle cell trait (HCC); Hemoglobin C trait (HCC); History of marijuana use; Supervision of high risk pregnancy in third trimester; Obesity during pregnancy, antepartum; Alpha thalassemia silent carrier; Gestational diabetes mellitus, antepartum; and LGA (large for gestational age) fetus affecting management of mother, third trimester on their problem list.  Patient reports no complaints. Reports fetal movement. Denies any contractions, bleeding or leaking of fluid.   The following portions of the patient's history were reviewed and updated as appropriate: allergies, current medications, past family history, past medical history, past social history, past surgical history and problem list.   Objective:   General:  Alert, oriented and cooperative.   Mental Status: Normal mood and affect perceived. Normal judgment and thought content.  Rest of physical exam deferred due to type of encounter  Assessment and Plan:  Pregnancy: G3P2002 at [redacted]w[redacted]d 1. Gestational diabetes mellitus (GDM) controlled on oral hypoglycemic drug, antepartum Just increased to present Metformin last night, FBS 119, PP 130-140.We discussed that she likely should use  insulin for control but will check soon to see how she does on present dose of oral medication  2. Alpha thalassemia silent carrier   3. Obesity during pregnancy, antepartum   Preterm labor symptoms and general obstetric precautions including but not limited to vaginal bleeding, contractions, leaking of fluid and fetal movement were reviewed in detail with the patient.  I discussed the assessment and treatment plan with the patient. The patient was provided an opportunity to ask questions and all were answered. The patient agreed with the plan and demonstrated an understanding of the instructions. The patient was advised to call back or seek an in-person office evaluation/go to MAU at [facility] for any urgent or concerning symptoms. Please refer to After Visit Summary for other counseling recommendations.   I provided 12 minutes of non-face-to-face time during this encounter.  Return in about 1 week (around 11/01/2019).  Future Appointments  Date Time Provider Department Center  11/01/2019  9:15 AM [facility] NURSE [facility]  11/01/2019  9:30 AM [facility] US3 [facility]  11/08/2019  9:15 AM [facility] NURSE [facility]  11/08/2019  9:30 AM [facility] US3 [facility]  11/13/2019  9:15 AM [facility] NURSE [facility]  11/13/2019  9:30 AM [facility] US3 [facility]  11/19/2019 11:00 AM Eulis Foster F, PT [facility]  11/21/2019 11:45 AM Theressa Millard, PT [facility]  11/30/2019 11:00 AM Theressa Millard, PT [facility]  12/03/2019 11:45 AM Theressa Millard, PT [facility]  12/06/2019 11:45 AM Theressa Millard, PT [facility]  12/10/2019 11:45 AM Theressa Millard, PT [facility]  12/13/2019 11:45 AM Theressa Millard, PT [facility]  12/17/2019 11:45 AM Theressa Millard, PT [facility]  12/20/2019 11:45 AM  Theressa Millard, PT [facility]    Scheryl Darter, MD Center for [facility], [facility]

## 2019-10-25 NOTE — Patient Instructions (Signed)

## 2019-10-25 NOTE — Progress Notes (Signed)
Pt is on the phone preparing for virtual visit with provider, [redacted]w[redacted]d. Pt reports that her blood sugar levels have been running a little high, fasting this morning of 119. Pt reports that she is not taking the Metformin as prescribed, she has been taking one tablet twice per day.

## 2019-10-28 ENCOUNTER — Encounter: Payer: Self-pay | Admitting: Emergency Medicine

## 2019-10-28 ENCOUNTER — Ambulatory Visit
Admission: EM | Admit: 2019-10-28 | Discharge: 2019-10-28 | Disposition: A | Discharge status: Home / Self Care | Payer: Medicaid Other | Attending: Physician Assistant | Admitting: Physician Assistant

## 2019-10-28 ENCOUNTER — Other Ambulatory Visit: Payer: Self-pay

## 2019-10-28 ENCOUNTER — Ambulatory Visit: Payer: Self-pay

## 2019-10-28 DIAGNOSIS — N764 Abscess of vulva: Secondary | ICD-10-CM | POA: Diagnosis not present

## 2019-10-28 MED ORDER — CEPHALEXIN 500 MG PO CAPS
500.0000 mg | ORAL_CAPSULE | Freq: Four times a day (QID) | ORAL | 0 refills | Status: DC
Start: 2019-10-28 — End: 2019-11-06

## 2019-10-28 MED ORDER — MUPIROCIN 2 % EX OINT: 1.0000 "application " | TOPICAL_OINTMENT | Freq: Two times a day (BID) | CUTANEOUS | 0 refills | Status: DC

## 2019-10-28 NOTE — ED Triage Notes (Signed)
Pt here for abscess in vaginal area to the left side; pt is currently [redacted] weeks pregnant; pt sts had similar recently that was drained and now has another

## 2019-10-28 NOTE — Discharge Instructions (Addendum)
Start keflex as directed. You can remove current dressing in 24 hours. Keep wound clean and dry. You can clean gently with soap and water. Do not soak area in water. Monitor for spreading redness, increased warmth, increased swelling, fever, follow up for reevaluation needed. 

## 2019-10-28 NOTE — ED Provider Notes (Signed)
[name]    CSN: [id number] Arrival date & time: 10/28/19  1252      History   Chief Complaint Chief Complaint  Patient presents with   Abscess    HPI Sarah Mayo is a 29 y.o. female.   29 year old female who is [redacted] weeks pregnant comes in for few day history of left labial abscess. Had similar symptoms 10/09/2019 with I&D, states that area completely resolved and healed prior to current symptom onset. Denies fever. Warm compresses without relief.      Past Medical History:  Diagnosis Date   Gestational diabetes    Sickle cell trait [facility])     Patient Active Problem List   Diagnosis Date Noted   Gestational diabetes mellitus, antepartum 09/14/2019   LGA (large for gestational age) fetus affecting management of mother, third trimester 09/14/2019   Alpha thalassemia silent carrier 06/11/2019   Obesity during pregnancy, antepartum 05/24/2019   Supervision of high risk pregnancy in third trimester 05/23/2019   BMI 37.0-37.9, adult 07/23/2014   Sickle cell trait (HCC) 07/23/2014   Hemoglobin C trait (HCC) 07/23/2014   History of marijuana use 07/23/2014    Past Surgical History:  Procedure Laterality Date   NO PAST SURGERIES      OB History    Gravida  3   Para  2   Term  2   Preterm      AB      Living  2     SAB      TAB      Ectopic      Multiple  0   Live Births  2            Home Medications    Prior to Admission medications   Medication Sig Start Date End Date Taking? Authorizing Provider  Accu-Chek Softclix Lancets lancets Use as instructed 09/18/19   Sparacino, Hailey L, DO  aspirin EC 81 MG tablet Take 1 tablet (81 mg total) by mouth daily. Take after 12 weeks for prevention of preeclampsia later in pregnancy. Start taking after 05/31/2019 05/31/19   Sharyon Cable, CNM  Blood Pressure Monitoring (BLOOD PRESSURE KIT) DEVI 1 kit by Does not apply route as needed. 06/01/19   Sharyon Cable, CNM    cephALEXin (KEFLEX) 500 MG capsule Take 1 capsule (500 mg total) by mouth 4 (four) times daily. 10/28/19   Belinda Fisher, PA-C  Elastic Bandages & Supports (COMFORT FIT MATERNITY SUPP SM) MISC 1 Units by Does not apply route daily as needed. 08/30/19   Nugent, Odie Sera, NP  glucose blood (ACCU-CHEK GUIDE) test strip Use as instructed 09/18/19   Sparacino, Hailey L, DO  metFORMIN (GLUCOPHAGE XR) 500 MG 24 hr tablet Take 2 tablets (1,000 mg total) by mouth 2 (two) times daily after a meal. Take after breakfast and after supper. 10/11/19   Brock Bad, MD  mupirocin ointment (BACTROBAN) 2 % Apply 1 application topically 2 (two) times daily. 10/28/19   Shaylea Ucci V, PA-C  Prenat-Fe Poly-Methfol-FA-DHA (VITAFOL FE+) 90-0.6-0.4-200 MG CAPS Take 1 tablet by mouth daily. Patient not taking: Reported on 09/27/2019 06/01/19   Sharyon Cable, CNM  Prenat-FeCbn-FeAsp-Meth-FA-DHA (PRENATE MINI) 18-0.6-0.4-350 MG CAPS Take 1 tablet by mouth daily. 05/24/19   Sharyon Cable, CNM  drospirenone-ethinyl estradiol (YASMIN 28) 3-0.03 MG tablet Take 1 tablet by mouth daily. Patient not taking: Reported on 02/12/2019 06/06/17 04/07/19  Adam Phenix, MD  fluticasone Aleda Grana)  50 MCG/ACT nasal spray Place 2 sprays into both nostrils daily. Patient not taking: Reported on 02/12/2019 06/13/18 04/07/19  Lestine Box, PA-C    Family History Family History  Problem Relation Age of Onset   Asthma Brother    Sickle cell trait Mother     Social History Social History   Tobacco Use   Smoking status: Never Smoker   Smokeless tobacco: Never Used  Vaping Use   Vaping Use: Never used  Substance Use Topics   Alcohol use: No    Comment: Social, Last drink August 2018   Drug use: No    Types: Marijuana    Comment: quit with pregnancy     Allergies   Patient has no known allergies.   Review of Systems Review of Systems  Reason unable to perform ROS: See HPI as above.     Physical Exam Triage Vital  Signs ED Triage Vitals  Enc Vitals Group     BP 10/28/19 0928 104/65     Pulse Rate 10/28/19 0928 (!) 121     Resp 10/28/19 0928 18     Temp 10/28/19 0928 98.4 F (36.9 C)     Temp Source 10/28/19 0928 Oral     SpO2 10/28/19 0928 97 %     Weight --      Height --      Head Circumference --      Peak Flow --      Pain Score 10/28/19 0938 8     Pain Loc --      Pain Edu? --      Excl. in [place]? --    No data found.  Updated Vital Signs BP 104/65 (BP Location: Left Arm)    Pulse (!) 121    Temp 98.4 F (36.9 C) (Oral)    Resp 18    LMP 03/01/2019    SpO2 97%   Visual Acuity Right Eye Distance:   Left Eye Distance:   Bilateral Distance:    Right Eye Near:   Left Eye Near:    Bilateral Near:     Physical Exam Constitutional:      General: She is not in acute distress.    Appearance: Normal appearance. She is well-developed. She is not toxic-appearing or diaphoretic.  HENT:     Head: Normocephalic and atraumatic.  Eyes:     Conjunctiva/sclera: Conjunctivae normal.     Pupils: Pupils are equal, round, and reactive to light.  Pulmonary:     Effort: Pulmonary effort is normal. No respiratory distress.  Genitourinary:   Musculoskeletal:     Cervical back: Normal range of motion and neck supple.  Skin:    General: Skin is warm and dry.  Neurological:     Mental Status: She is alert and oriented to person, place, and time.      UC Treatments / Results  Labs (all labs ordered are listed, but only abnormal results are displayed) Labs Reviewed - No data to display  EKG   Radiology No results found.  Procedures Incision and Drainage  Date/Time: 10/28/2019 10:04 AM Performed by: Ok Edwards, PA-C Authorized by: Ok Edwards, PA-C   Consent:    Consent obtained:  Verbal   Consent given by:  Patient   Risks discussed:  Bleeding, incomplete drainage, pain and infection   Alternatives discussed:  Alternative treatment and referral Location:    Type:  Abscess    Size:  1cm x 1cm   Location:  Anogenital   Anogenital location:  Vulva Pre-procedure details:    Skin preparation:  Chloraprep Anesthesia (see MAR for exact dosages):    Anesthesia method:  Local infiltration   Local anesthetic:  Lidocaine 1% w/o epi Procedure type:    Complexity:  Simple Procedure details:    Needle aspiration: no     Incision types:  Single straight   Incision depth:  Subcutaneous   Scalpel blade:  11   Drainage:  Purulent   Drainage amount:  Moderate   Wound treatment:  Wound left open   Packing materials:  None Post-procedure details:    Patient tolerance of procedure:  Tolerated well, no immediate complications   (including critical care time)  Medications Ordered in UC Medications - No data to display  Initial Impression / Assessment and Plan / UC Course  I have reviewed the triage vital signs and the nursing notes.  Pertinent labs & imaging results that were available during my care of the patient were reviewed by me and considered in my medical decision making (see chart for details).     Patient was slightly tachycardic. No chest pain, shob. Will have patient monitor at home and follow up with OB. Patient tolerated procedure well. Start keflex for surrounding cellulitis. Wound care instructions given. Return precautions given. Patient expresses understanding and agrees to plan.   Final Clinical Impressions(s) / UC Diagnoses   Final diagnoses:  Labial abscess    ED Prescriptions    Medication Sig Dispense Auth. Provider   cephALEXin (KEFLEX) 500 MG capsule Take 1 capsule (500 mg total) by mouth 4 (four) times daily. 28 capsule Verlie Liotta V, PA-C   mupirocin ointment (BACTROBAN) 2 % Apply 1 application topically 2 (two) times daily. 22 g Ok Edwards, PA-C     PDMP not reviewed this encounter.   Ok Edwards, PA-C 10/28/19 1005

## 2019-11-01 ENCOUNTER — Ambulatory Visit: Payer: Medicaid Other | Attending: Obstetrics and Gynecology

## 2019-11-01 ENCOUNTER — Other Ambulatory Visit: Payer: Self-pay

## 2019-11-01 ENCOUNTER — Ambulatory Visit: Payer: Medicaid Other | Admitting: *Deleted

## 2019-11-01 DIAGNOSIS — O99210 Obesity complicating pregnancy, unspecified trimester: Secondary | ICD-10-CM | POA: Insufficient documentation

## 2019-11-01 DIAGNOSIS — D563 Thalassemia minor: Secondary | ICD-10-CM | POA: Insufficient documentation

## 2019-11-01 DIAGNOSIS — E669 Obesity, unspecified: Secondary | ICD-10-CM | POA: Diagnosis not present

## 2019-11-01 DIAGNOSIS — Z862 Personal history of diseases of the blood and blood-forming organs and certain disorders involving the immune mechanism: Secondary | ICD-10-CM | POA: Diagnosis not present

## 2019-11-01 DIAGNOSIS — O99213 Obesity complicating pregnancy, third trimester: Secondary | ICD-10-CM

## 2019-11-01 DIAGNOSIS — Z3A34 34 weeks gestation of pregnancy: Secondary | ICD-10-CM

## 2019-11-01 DIAGNOSIS — Z148 Genetic carrier of other disease: Secondary | ICD-10-CM | POA: Diagnosis not present

## 2019-11-01 DIAGNOSIS — O24415 Gestational diabetes mellitus in pregnancy, controlled by oral hypoglycemic drugs: Secondary | ICD-10-CM

## 2019-11-01 DIAGNOSIS — O09293 Supervision of pregnancy with other poor reproductive or obstetric history, third trimester: Secondary | ICD-10-CM | POA: Diagnosis not present

## 2019-11-05 ENCOUNTER — Telehealth: Payer: Self-pay

## 2019-11-05 NOTE — Telephone Encounter (Signed)
Called pt to find out if having problems logging into [employer] to record Glucose Readings. Pt stated no, asked if she can make sure to log info daily. Pt verbalized understanding.l

## 2019-11-06 ENCOUNTER — Encounter: Payer: Self-pay | Admitting: Family Medicine

## 2019-11-06 ENCOUNTER — Encounter (HOSPITAL_COMMUNITY): Payer: Self-pay | Admitting: Obstetrics and Gynecology

## 2019-11-06 ENCOUNTER — Encounter: Payer: Medicaid Other | Admitting: Family Medicine

## 2019-11-06 ENCOUNTER — Inpatient Hospital Stay (HOSPITAL_COMMUNITY)
Admission: AD | Admit: 2019-11-06 | Discharge: 2019-11-06 | Disposition: A | Discharge status: Home / Self Care | Payer: Medicaid Other | Attending: Obstetrics and Gynecology | Admitting: Obstetrics and Gynecology

## 2019-11-06 ENCOUNTER — Other Ambulatory Visit: Payer: Self-pay

## 2019-11-06 DIAGNOSIS — O4703 False labor before 37 completed weeks of gestation, third trimester: Secondary | ICD-10-CM

## 2019-11-06 DIAGNOSIS — O99013 Anemia complicating pregnancy, third trimester: Secondary | ICD-10-CM | POA: Diagnosis not present

## 2019-11-06 DIAGNOSIS — Z79899 Other long term (current) drug therapy: Secondary | ICD-10-CM | POA: Insufficient documentation

## 2019-11-06 DIAGNOSIS — Z7984 Long term (current) use of oral hypoglycemic drugs: Secondary | ICD-10-CM | POA: Diagnosis not present

## 2019-11-06 DIAGNOSIS — Z7982 Long term (current) use of aspirin: Secondary | ICD-10-CM | POA: Insufficient documentation

## 2019-11-06 DIAGNOSIS — Z3A35 35 weeks gestation of pregnancy: Secondary | ICD-10-CM | POA: Diagnosis not present

## 2019-11-06 DIAGNOSIS — D573 Sickle-cell trait: Secondary | ICD-10-CM | POA: Insufficient documentation

## 2019-11-06 LAB — COMPREHENSIVE METABOLIC PANEL
ALT: 36 U/L (ref 0–44)
AST: 24 U/L (ref 15–41)
Albumin: 2.8 g/dL — ABNORMAL LOW (ref 3.5–5.0)
Alkaline Phosphatase: 119 U/L (ref 38–126)
Anion gap: 9 (ref 5–15)
BUN: <5 mg/dL — ABNORMAL LOW (ref 6–20)
CO2: 19 mmol/L — ABNORMAL LOW (ref 22–32)
Calcium: 9 mg/dL (ref 8.9–10.3)
Chloride: 105 mmol/L (ref 98–111)
Creatinine, Ser: 0.53 mg/dL (ref 0.44–1.00)
GFR calc Af Amer: >60 mL/min (ref >60)
GFR calc non Af Amer: >60 mL/min (ref >60)
Glucose, Bld: 98 mg/dL (ref 70–99)
Potassium: 4.1 mmol/L (ref 3.5–5.1)
Sodium: 133 mmol/L — ABNORMAL LOW (ref 135–145)
Total Bilirubin: 0.8 mg/dL (ref 0.3–1.2)
Total Protein: 6.7 g/dL (ref 6.5–8.1)

## 2019-11-06 LAB — URINALYSIS, ROUTINE W REFLEX MICROSCOPIC
Bilirubin Urine: NEGATIVE
Glucose, UA: NEGATIVE mg/dL
Hgb urine dipstick: NEGATIVE
Ketones, ur: 20 mg/dL — AB
Nitrite: NEGATIVE
Protein, ur: 30 mg/dL — AB
Specific Gravity, Urine: 1.02 (ref 1.005–1.030)
pH: 6 (ref 5.0–8.0)

## 2019-11-06 LAB — GLUCOSE, CAPILLARY: Glucose-Capillary: 100 mg/dL — ABNORMAL HIGH (ref 70–99)

## 2019-11-06 MED ORDER — CYCLOBENZAPRINE HCL 5 MG PO TABS
5.0000 mg | ORAL_TABLET | Freq: Once | ORAL | Status: AC
  Administered 2019-11-06: 5 mg via ORAL
  Filled 2019-11-06: qty 1

## 2019-11-06 MED ORDER — PROMETHAZINE HCL 25 MG PO TABS
12.5000 mg | ORAL_TABLET | Freq: Once | ORAL | Status: AC
  Administered 2019-11-06: 12.5 mg via ORAL
  Filled 2019-11-06: qty 1

## 2019-11-06 MED ORDER — LACTATED RINGERS IV BOLUS
500.0000 mL | Freq: Once | INTRAVENOUS | Status: AC
  Administered 2019-11-06: 500 mL via INTRAVENOUS

## 2019-11-06 NOTE — MAU Note (Signed)
Presents with c/o ctxs "back to back", began this morning approximately 30 minutes ago.  Denies VB or LOF.  Endorses +FM.

## 2019-11-06 NOTE — Discharge Instructions (Signed)
Braxton Hicks Contractions °Contractions of the uterus can occur throughout pregnancy, but they are not always a sign that you are in labor. You may have practice contractions called Braxton Hicks contractions. These false labor contractions are sometimes confused with true labor. °What are Braxton Hicks contractions? °Braxton Hicks contractions are tightening movements that occur in the muscles of the uterus before labor. Unlike true labor contractions, these contractions do not result in opening (dilation) and thinning of the cervix. Toward the end of pregnancy (32-34 weeks), Braxton Hicks contractions can happen more often and may become stronger. These contractions are sometimes difficult to tell apart from true labor because they can be very uncomfortable. You should not feel embarrassed if you go to the hospital with false labor. °Sometimes, the only way to tell if you are in true labor is for your health care provider to look for changes in the cervix. The health care provider will do a physical exam and may monitor your contractions. If you are not in true labor, the exam should show that your cervix is not dilating and your water has not broken. °If there are no other health problems associated with your pregnancy, it is completely safe for you to be sent home with false labor. You may continue to have Braxton Hicks contractions until you go into true labor. °How to tell the difference between true labor and false labor °True labor °· Contractions last 30-70 seconds. °· Contractions become very regular. °· Discomfort is usually felt in the top of the uterus, and it spreads to the lower abdomen and low back. °· Contractions do not go away with walking. °· Contractions usually become more intense and increase in frequency. °· The cervix dilates and gets thinner. °False labor °· Contractions are usually shorter and not as strong as true labor contractions. °· Contractions are usually irregular. °· Contractions  are often felt in the front of the lower abdomen and in the groin. °· Contractions may go away when you walk around or change positions while lying down. °· Contractions get weaker and are shorter-lasting as time goes on. °· The cervix usually does not dilate or become thin. °Follow these instructions at home: ° °· Take over-the-counter and prescription medicines only as told by your health care provider. °· Keep up with your usual exercises and follow other instructions from your health care provider. °· Eat and drink lightly if you think you are going into labor. °· If Braxton Hicks contractions are making you uncomfortable: °? Change your position from lying down or resting to walking, or change from walking to resting. °? Sit and rest in a tub of warm water. °? Drink enough fluid to keep your urine pale yellow. Dehydration may cause these contractions. °? Do slow and deep breathing several times an hour. °· Keep all follow-up prenatal visits as told by your health care provider. This is important. °Contact a health care provider if: °· You have a fever. °· You have continuous pain in your abdomen. °Get help right away if: °· Your contractions become stronger, more regular, and closer together. °· You have fluid leaking or gushing from your vagina. °· You pass blood-tinged mucus (bloody show). °· You have bleeding from your vagina. °· You have low back pain that you never had before. °· You feel your baby’s head pushing down and causing pelvic pressure. °· Your baby is not moving inside you as much as it used to. °Summary °· Contractions that occur before labor are   called Braxton Hicks contractions, false labor, or practice contractions. °· Braxton Hicks contractions are usually shorter, weaker, farther apart, and less regular than true labor contractions. True labor contractions usually become progressively stronger and regular, and they become more frequent. °· Manage discomfort from Braxton Hicks contractions  by changing position, resting in a warm bath, drinking plenty of water, or practicing deep breathing. °This information is not intended to replace advice given to you by your health care provider. Make sure you discuss any questions you have with your health care provider. °Document Revised: 02/18/2017 Document Reviewed: 07/22/2016 °Elsevier Patient Education © 2020 [employer] ° °

## 2019-11-06 NOTE — Progress Notes (Signed)
Patient did not keep appointment today. She will be called to reschedule.  

## 2019-11-06 NOTE — MAU Provider Note (Signed)
History     CSN: [id number]  Arrival date and time: 11/06/19 3291   First Provider Initiated Contact with Patient 11/06/19 1002      Chief Complaint  Patient presents with  . Contractions   Sarah Mayo is a 29 y.o. G3P2002 at 93w0dwho presents to MAU having "back to back" contractions she feels above and to the right of her pubic bone. She denies LOF, VB, vaginal discharge or urinary symptoms. Last checked her glucose last night.   Past Medical History:  Diagnosis Date  . Gestational diabetes   . Sickle cell trait ([facility]     Past Surgical History:  Procedure Laterality Date  . NO PAST SURGERIES      Family History  Problem Relation Age of Onset  . Asthma Brother   . Sickle cell trait Mother     Social History   Tobacco Use  . Smoking status: Never Smoker  . Smokeless tobacco: Never Used  Vaping Use  . Vaping Use: Never used  Substance Use Topics  . Alcohol use: No    Comment: Social, Last drink August 2018  . Drug use: No    Types: Marijuana    Comment: quit with pregnancy    Allergies: No Known Allergies  Medications Prior to Admission  Medication Sig Dispense Refill Last Dose  . Accu-Chek Softclix Lancets lancets Use as instructed 100 each 12 11/05/2019 at Unknown time  . aspirin EC 81 MG tablet Take 1 tablet (81 mg total) by mouth daily. Take after 12 weeks for prevention of preeclampsia later in pregnancy. Start taking after 05/31/2019 300 tablet 0 11/05/2019 at Unknown time  . Elastic Bandages & Supports (COMFORT FIT MATERNITY SUPP SM) MISC 1 Units by Does not apply route daily as needed. 1 each 0 Past Week at Unknown time  . glucose blood (ACCU-CHEK GUIDE) test strip Use as instructed 100 each 12 11/05/2019 at Unknown time  . metFORMIN (GLUCOPHAGE XR) 500 MG 24 hr tablet Take 2 tablets (1,000 mg total) by mouth 2 (two) times daily after a meal. Take after breakfast and after supper. 120 tablet 11 11/05/2019 at Unknown time  . Prenat-Fe  Poly-Methfol-FA-DHA (VITAFOL FE+) 90-0.6-0.4-200 MG CAPS Take 1 tablet by mouth daily. 30 capsule 12 11/05/2019 at Unknown time  . Prenat-FeCbn-FeAsp-Meth-FA-DHA (PRENATE MINI) 18-0.6-0.4-350 MG CAPS Take 1 tablet by mouth daily. 60 capsule 7 11/05/2019 at Unknown time  . Blood Pressure Monitoring (BLOOD PRESSURE KIT) DEVI 1 kit by Does not apply route as needed. 1 each 0   . cephALEXin (KEFLEX) 500 MG capsule Take 1 capsule (500 mg total) by mouth 4 (four) times daily. (Patient not taking: Reported on 11/01/2019) 28 capsule 0   . mupirocin ointment (BACTROBAN) 2 % Apply 1 application topically 2 (two) times daily. 22 g 0     Review of Systems  All other systems reviewed and are negative.  Physical Exam   Blood pressure 138/74, pulse (!) 109, temperature 98.3 F (36.8 C), temperature source Oral, resp. rate 20, height _0  (1.753 m), weight 262 lb 9.6 oz (119.1 kg), last menstrual period 03/01/2019, SpO2 100 %.  Physical Exam Vitals and nursing note reviewed.  Constitutional:      Appearance: Normal appearance.  HENT:     Mouth/Throat:     Mouth: Mucous membranes are moist.  Eyes:     Pupils: Pupils are equal, round, and reactive to light.  Cardiovascular:     Rate and Rhythm: Regular rhythm. Tachycardia present.  Pulses: Normal pulses.     Heart sounds: Normal heart sounds.     Comments: Mildly tachycardic (>100), was the same on last MAU admission Pulmonary:     Effort: Pulmonary effort is normal.     Breath sounds: Normal breath sounds.  Abdominal:     General: Bowel sounds are normal.  Genitourinary:    General: Normal vulva.  Musculoskeletal:        General: Normal range of motion.  Skin:    General: Skin is warm and dry.     Capillary Refill: Capillary refill takes less than 2 seconds.  Neurological:     Mental Status: She is alert and oriented to person, place, and time.  Psychiatric:        Mood and Affect: Mood normal.        Behavior: Behavior normal.         Thought Content: Thought content normal.        Judgment: Judgment normal.    Fetal Tracing (1015): reactive Baseline: 145 Variability: moderate Accelerations: present Decelerations: none Toco: q3-80mn  Dilation: 1 Effacement (%): Thick, 50 Cervical Position: Posterior Exam by:: Malone Admire, CNM  MAU Course & MDM   1000: UA, CBG, CMP Flexeril + 5058mLR fluid bolus (increased to 10006mfter UA resulted)  Results for orders placed or performed during the hospital encounter of 11/06/19 (from the past 24 hour(s))  Urinalysis, Routine w reflex microscopic Urine, Clean Catch     Status: Abnormal   Collection Time: 11/06/19  9:35 AM  Result Value Ref Range   Color, Urine AMBER (A) YELLOW   APPearance HAZY (A) CLEAR   Specific Gravity, Urine 1.020 1.005 - 1.030   pH 6.0 5.0 - 8.0   Glucose, UA NEGATIVE NEGATIVE mg/dL   Hgb urine dipstick NEGATIVE NEGATIVE   Bilirubin Urine NEGATIVE NEGATIVE   Ketones, ur 20 (A) NEGATIVE mg/dL   Protein, ur 30 (A) NEGATIVE mg/dL   Nitrite NEGATIVE NEGATIVE   Leukocytes,Ua TRACE (A) NEGATIVE   RBC / HPF 0-5 0 - 5 RBC/hpf   WBC, UA 0-5 0 - 5 WBC/hpf   Bacteria, UA RARE (A) NONE SEEN   Squamous Epithelial / LPF 11-20 0 - 5   Mucus PRESENT   Glucose, capillary     Status: Abnormal   Collection Time: 11/06/19 10:08 AM  Result Value Ref Range   Glucose-Capillary 100 (H) 70 - 99 mg/dL  Comprehensive metabolic panel     Status: Abnormal   Collection Time: 11/06/19 10:39 AM  Result Value Ref Range   Sodium 133 (L) 135 - 145 mmol/L   Potassium 4.1 3.5 - 5.1 mmol/L   Chloride 105 98 - 111 mmol/L   CO2 19 (L) 22 - 32 mmol/L   Glucose, Bld 98 70 - 99 mg/dL   BUN <5 (L) 6 - 20 mg/dL   Creatinine, Ser 0.53 0.44 - 1.00 mg/dL   Calcium 9.0 8.9 - 10.3 mg/dL   Total Protein 6.7 6.5 - 8.1 g/dL   Albumin 2.8 (L) 3.5 - 5.0 g/dL   AST 24 15 - 41 U/L   ALT 36 0 - 44 U/L   Alkaline Phosphatase 119 38 - 126 U/L   Total Bilirubin 0.8 0.3 - 1.2 mg/dL   GFR  calc non Af Amer >60 >60 mL/min   GFR calc Af Amer >60 >60 mL/min   Anion gap 9 5 - 15    1100 - Pt requested nausea meds, phenergan ordered  1230 -  nausea and pain relieved, contractions have eased, cervix remains unchanged  Assessment and Plan  [redacted] weeks gestation Preterm false labor Discharge home in stable condition Follow up today (2:45pm) at [facility], [place], MSN, [facility] 11/06/19 1:03 PM

## 2019-11-08 ENCOUNTER — Ambulatory Visit: Payer: Medicaid Other | Attending: Obstetrics and Gynecology

## 2019-11-08 ENCOUNTER — Ambulatory Visit: Payer: Medicaid Other | Admitting: *Deleted

## 2019-11-08 ENCOUNTER — Other Ambulatory Visit: Payer: Self-pay

## 2019-11-08 ENCOUNTER — Encounter: Payer: Self-pay | Admitting: *Deleted

## 2019-11-08 DIAGNOSIS — Z148 Genetic carrier of other disease: Secondary | ICD-10-CM

## 2019-11-08 DIAGNOSIS — O99210 Obesity complicating pregnancy, unspecified trimester: Secondary | ICD-10-CM | POA: Diagnosis not present

## 2019-11-08 DIAGNOSIS — O24415 Gestational diabetes mellitus in pregnancy, controlled by oral hypoglycemic drugs: Secondary | ICD-10-CM

## 2019-11-08 DIAGNOSIS — O99213 Obesity complicating pregnancy, third trimester: Secondary | ICD-10-CM

## 2019-11-08 DIAGNOSIS — D563 Thalassemia minor: Secondary | ICD-10-CM | POA: Diagnosis not present

## 2019-11-08 DIAGNOSIS — Z3A35 35 weeks gestation of pregnancy: Secondary | ICD-10-CM

## 2019-11-08 DIAGNOSIS — Z862 Personal history of diseases of the blood and blood-forming organs and certain disorders involving the immune mechanism: Secondary | ICD-10-CM

## 2019-11-08 DIAGNOSIS — O09293 Supervision of pregnancy with other poor reproductive or obstetric history, third trimester: Secondary | ICD-10-CM

## 2019-11-08 DIAGNOSIS — E669 Obesity, unspecified: Secondary | ICD-10-CM | POA: Diagnosis not present

## 2019-11-08 LAB — CULTURE, OB URINE

## 2019-11-09 ENCOUNTER — Encounter: Payer: Self-pay | Admitting: Family Medicine

## 2019-11-13 ENCOUNTER — Encounter: Payer: Self-pay | Admitting: *Deleted

## 2019-11-13 ENCOUNTER — Ambulatory Visit: Payer: Medicaid Other | Attending: Obstetrics and Gynecology

## 2019-11-13 ENCOUNTER — Ambulatory Visit: Payer: Medicaid Other | Admitting: *Deleted

## 2019-11-13 ENCOUNTER — Ambulatory Visit (INDEPENDENT_AMBULATORY_CARE_PROVIDER_SITE_OTHER): Payer: Medicaid Other | Admitting: Obstetrics and Gynecology

## 2019-11-13 ENCOUNTER — Other Ambulatory Visit: Payer: Self-pay

## 2019-11-13 ENCOUNTER — Other Ambulatory Visit (HOSPITAL_COMMUNITY)
Admission: RE | Admit: 2019-11-13 | Discharge: 2019-11-13 | Disposition: A | Discharge status: Home / Self Care | Payer: Medicaid Other | Source: Ambulatory Visit | Attending: Obstetrics and Gynecology | Admitting: Obstetrics and Gynecology

## 2019-11-13 ENCOUNTER — Encounter: Payer: Self-pay | Admitting: Obstetrics and Gynecology

## 2019-11-13 VITALS — BP 116/82 | HR 98 | Wt 265.2 lb

## 2019-11-13 DIAGNOSIS — Z362 Encounter for other antenatal screening follow-up: Secondary | ICD-10-CM

## 2019-11-13 DIAGNOSIS — Z148 Genetic carrier of other disease: Secondary | ICD-10-CM | POA: Diagnosis not present

## 2019-11-13 DIAGNOSIS — O09293 Supervision of pregnancy with other poor reproductive or obstetric history, third trimester: Secondary | ICD-10-CM | POA: Diagnosis not present

## 2019-11-13 DIAGNOSIS — O24415 Gestational diabetes mellitus in pregnancy, controlled by oral hypoglycemic drugs: Secondary | ICD-10-CM | POA: Insufficient documentation

## 2019-11-13 DIAGNOSIS — O99210 Obesity complicating pregnancy, unspecified trimester: Secondary | ICD-10-CM | POA: Diagnosis not present

## 2019-11-13 DIAGNOSIS — O0993 Supervision of high risk pregnancy, unspecified, third trimester: Secondary | ICD-10-CM | POA: Diagnosis not present

## 2019-11-13 DIAGNOSIS — D563 Thalassemia minor: Secondary | ICD-10-CM

## 2019-11-13 DIAGNOSIS — IMO0001 Reserved for inherently not codable concepts without codable children: Secondary | ICD-10-CM

## 2019-11-13 DIAGNOSIS — E669 Obesity, unspecified: Secondary | ICD-10-CM | POA: Diagnosis not present

## 2019-11-13 DIAGNOSIS — O99213 Obesity complicating pregnancy, third trimester: Secondary | ICD-10-CM | POA: Diagnosis not present

## 2019-11-13 DIAGNOSIS — Z3A36 36 weeks gestation of pregnancy: Secondary | ICD-10-CM

## 2019-11-13 DIAGNOSIS — Z862 Personal history of diseases of the blood and blood-forming organs and certain disorders involving the immune mechanism: Secondary | ICD-10-CM | POA: Diagnosis not present

## 2019-11-13 DIAGNOSIS — O3663X0 Maternal care for excessive fetal growth, third trimester, not applicable or unspecified: Secondary | ICD-10-CM

## 2019-11-13 NOTE — Patient Instructions (Signed)
Cesarean Delivery Cesarean birth, or cesarean delivery, is the surgical delivery of a baby through an incision in the abdomen and the uterus. This may be referred to as a C-section. This procedure may be scheduled ahead of time, or it may be done in an emergency situation. Tell a health care provider about:  Any allergies you have.  All medicines you are taking, including vitamins, herbs, eye drops, creams, and over-the-counter medicines.  Any problems you or family members have had with anesthetic medicines.  Any blood disorders you have.  Any surgeries you have had.  Any medical conditions you have.  Whether you or any members of your family have a history of deep vein thrombosis (DVT) or pulmonary embolism (PE). What are the risks? Generally, this is a safe procedure. However, problems may occur, including:  Infection.  Bleeding.  Allergic reactions to medicines.  Damage to other structures or organs.  Blood clots.  Injury to your baby. What happens before the procedure? General instructions  Follow instructions from your health care provider about eating or drinking restrictions.  If you know that you are going to have a cesarean delivery, do not shave your pubic area. Shaving before the procedure may increase your risk of infection.  Plan to have someone take you home from the hospital.  Ask your health care provider what steps will be taken to prevent infection. These may include: ? Removing hair at the surgery site. ? Washing skin with a germ-killing soap. ? Taking antibiotic medicine.  Depending on the reason for your cesarean delivery, you may have a physical exam or additional testing, such as an ultrasound.  You may have your blood or urine tested. Questions for your health care provider  Ask your health care provider about: ? Changing or stopping your regular medicines. This is especially important if you are taking diabetes medicines or blood  thinners. ? Your pain management plan. This is especially important if you plan to breastfeed your baby. ? How long you will be in the hospital after the procedure. ? Any concerns you may have about receiving blood products, if you need them during the procedure. ? Cord blood banking, if you plan to collect your baby's umbilical cord blood.  You may also want to ask your health care provider: ? Whether you will be able to hold or breastfeed your baby while you are still in the operating room. ? Whether your baby can stay with you immediately after the procedure and during your recovery. ? Whether a family member or a person of your choice can go with you into the operating room and stay with you during the procedure, immediately after the procedure, and during your recovery. What happens during the procedure?   An IV will be inserted into one of your veins.  Fluid and medicines, such as antibiotics, will be given before the surgery.  Fetal monitors will be placed on your abdomen to check your baby's heart rate.  You may be given a special warming gown to wear to keep your temperature stable.  A catheter may be inserted into your bladder through your urethra. This drains your urine during the procedure.  You may be given one or more of the following: ? A medicine to numb the area (local anesthetic). ? A medicine to make you fall asleep (general anesthetic). ? A medicine (regional anesthetic) that is injected into your back or through a small thin tube placed in your back (spinal anesthetic or epidural anesthetic).   This numbs everything below the injection site and allows you to stay awake during your procedure. If this makes you feel nauseous, tell your health care provider. Medicines will be available to help reduce any nausea you may feel.  An incision will be made in your abdomen, and then in your uterus.  If you are awake during your procedure, you may feel tugging and pulling in  your abdomen, but you should not feel pain. If you feel pain, tell your health care provider immediately.  Your baby will be removed from your uterus. You may feel more pressure or pushing while this happens.  Immediately after birth, your baby will be dried and kept warm. You may be able to hold and breastfeed your baby.  The umbilical cord may be clamped and cut during this time. This usually occurs after waiting a period of 1-2 minutes after delivery.  Your placenta will be removed from your uterus.  Your incisions will be closed with stitches (sutures). Staples, skin glue, or adhesive strips may also be applied to the incision in your abdomen.  Bandages (dressings) may be placed over the incision in your abdomen. The procedure may vary among health care providers and hospitals. What happens after the procedure?  Your blood pressure, heart rate, breathing rate, and blood oxygen level will be monitored until you are discharged from the hospital.  You may continue to receive fluids and medicines through an IV.  You will have some pain. Medicines will be available to help control your pain.  To help prevent blood clots: ? You may be given medicines. ? You may have to wear compression stockings or devices. ? You will be encouraged to walk around when you are able.  Hospital staff will encourage and support bonding with your baby. Your hospital may have you and your baby to stay in the same room (rooming in) during your hospital stay to encourage successful bonding and breastfeeding.  You may be encouraged to cough and breathe deeply often. This helps to prevent lung problems.  If you have a catheter draining your urine, it will be removed as soon as possible after your procedure. Summary  Cesarean birth, or cesarean delivery, is the surgical delivery of a baby through an incision in the abdomen and the uterus.  Follow instructions from your health care provider about eating or  drinking restrictions before the procedure.  You will have some pain after the procedure. Medicines will be available to help control your pain.  Hospital staff will encourage and support bonding with your baby after the procedure. Your hospital may have you and your baby to stay in the same room (rooming in) during your hospital stay to encourage successful bonding and breastfeeding. This information is not intended to replace advice given to you by your health care provider. Make sure you discuss any questions you have with your health care provider. Document Revised: 09/12/2017 Document Reviewed: 09/12/2017 Elsevier Patient Education  2020 [employer]  

## 2019-11-13 NOTE — Progress Notes (Signed)
Pt is here for ROB, [redacted]w[redacted]d.  

## 2019-11-13 NOTE — Progress Notes (Signed)
   PRENATAL VISIT NOTE  Subjective:  Sarah Mayo is a 29 y.o. G3P2002 at [redacted]w[redacted]d being seen today for ongoing prenatal care.  She is currently monitored for the following issues for this high-risk pregnancy and has BMI 37.0-37.9, adult; Sickle cell trait (HCC); Hemoglobin C trait (HCC); History of marijuana use; Supervision of high risk pregnancy in third trimester; Obesity during pregnancy, antepartum; Alpha thalassemia silent carrier; Gestational diabetes mellitus, antepartum; and LGA (large for gestational age) fetus affecting management of mother, third trimester on their problem list.  Patient reports no complaints.  Contractions: Irregular. Vag. Bleeding: None.  Movement: Present. Denies leaking of fluid.   Patient seen at [facility] today.  [place]:  EFW 4754 gms.  AFI 9.57  BPP 8/8  Patient did not bring her glucose log.    The following portions of the patient's history were reviewed and updated as appropriate: allergies, current medications, past family history, past medical history, past social history, past surgical history and problem list.   Objective:   Vitals:   11/13/19 0813  BP: 116/82  Pulse: 98  Weight: 265 lb 3.2 oz (120.3 kg)    Fetal Status: Fetal Heart Rate (bpm): 140 Fundal Height: 45 cm Movement: Present  Presentation: Vertex  General:  Alert, oriented and cooperative. Patient is in no acute distress.  Skin: Skin is warm and dry. No rash noted.   Cardiovascular: Normal heart rate noted  Respiratory: Normal respiratory effort, no problems with respiration noted  Abdomen: Soft, gravid, appropriate for gestational age.  Pain/Pressure: Present     Pelvic: Cervical exam deferred        Extremities: Normal range of motion.  Edema: None  Mental Status: Normal mood and affect. Normal behavior. Normal judgment and thought content.   Assessment and Plan:  Pregnancy: G3P2002 at [redacted]w[redacted]d 1. Supervision of high risk pregnancy in third trimester - GBS and genital cultures  collected.  2. Fetal macrosomia in third trimester, single or unspecified fetus - Risks of shoulder dystocia and neurologic injuries discussed. - Patient agrees to primary c/section.  3. Gestational diabetes mellitus (GDM) controlled on oral hypoglycemic drug, antepartum - Unable to view glucose log. - Last CMB revealed an elevated glucose - Patient for delivery via c/section at 37 weeks.  Will schedule for 11/20/2019.  4. Alpha thalassemia silent carrier   Preterm labor symptoms and general obstetric precautions including but not limited to vaginal bleeding, contractions, leaking of fluid and fetal movement were reviewed in detail with the patient. Please refer to After Visit Summary for other counseling recommendations.   No follow-ups on file.  Future Appointments  Date Time Provider Department Center  11/19/2019 11:00 AM Theressa Millard, [place] [facility]  11/20/2019  8:15 AM [facility] US2 [facility]  11/21/2019 11:45 AM Theressa Millard, PT [facility]  11/30/2019 11:00 AM Theressa Millard, PT [facility]  12/03/2019 11:45 AM Theressa Millard, PT [facility]  12/06/2019 11:45 AM Theressa Millard, PT [facility]  12/10/2019 11:45 AM Theressa Millard, PT [facility]  12/13/2019 11:45 AM Theressa Millard, PT [facility]  12/17/2019 11:45 AM Theressa Millard, PT [facility]  12/20/2019 11:45 AM Theressa Millard, PT [facility]    Johnny Bridge, MD

## 2019-11-14 ENCOUNTER — Other Ambulatory Visit: Payer: Self-pay | Admitting: Obstetrics and Gynecology

## 2019-11-14 ENCOUNTER — Encounter (HOSPITAL_COMMUNITY): Payer: Self-pay

## 2019-11-14 LAB — CERVICOVAGINAL ANCILLARY ONLY
Chlamydia: NEGATIVE
Comment: NEGATIVE
Comment: NORMAL
Neisseria Gonorrhea: NEGATIVE

## 2019-11-14 NOTE — Patient Instructions (Signed)
Sarah Mayo  11/14/2019   Your procedure is scheduled on:  11/21/2019  Arrive at 0800 at Entrance C on [address] at [facility]  and [employer]. You are invited to use the FREE valet parking or use the Visitor's parking deck.  Pick up the phone at the desk and dial [phone].  Call this number if you have problems the morning of surgery: [phone]  Remember:   Do not eat food:(After Midnight) Desps de medianoche.  Do not drink clear liquids: (After Midnight) Desps de medianoche.  Take these medicines the morning of surgery with A SIP OF WATER:  Do not take metformin the night before or the day of surgery.   Do not wear jewelry, make-up or nail polish.  Do not wear lotions, powders, or perfumes. Do not wear deodorant.  Do not shave 48 hours prior to surgery.  Do not bring valuables to the hospital.  [facility] is not   responsible for any belongings or valuables brought to the hospital.  Contacts, dentures or bridgework may not be worn into surgery.  Leave suitcase in the car. After surgery it may be brought to your room.  For patients admitted to the hospital, checkout time is 11:00 AM the day of              discharge.      Please read over the following fact sheets that you were given:     Preparing for Surgery

## 2019-11-15 ENCOUNTER — Other Ambulatory Visit: Payer: Self-pay | Admitting: Obstetrics and Gynecology

## 2019-11-15 DIAGNOSIS — O24410 Gestational diabetes mellitus in pregnancy, diet controlled: Secondary | ICD-10-CM

## 2019-11-15 DIAGNOSIS — Z3A37 37 weeks gestation of pregnancy: Secondary | ICD-10-CM

## 2019-11-15 LAB — STREP GP B NAA: Strep Gp B NAA: NEGATIVE

## 2019-11-19 ENCOUNTER — Other Ambulatory Visit: Payer: Self-pay

## 2019-11-19 ENCOUNTER — Ambulatory Visit: Payer: Medicaid Other | Attending: Physical Therapy | Admitting: Physical Therapy

## 2019-11-19 ENCOUNTER — Ambulatory Visit (HOSPITAL_COMMUNITY)
Admission: RE | Admit: 2019-11-19 | Discharge: 2019-11-19 | Disposition: A | Discharge status: Home / Self Care | Payer: Medicaid Other | Source: Ambulatory Visit | Attending: Obstetrics and Gynecology | Admitting: Obstetrics and Gynecology

## 2019-11-19 DIAGNOSIS — Z3A37 37 weeks gestation of pregnancy: Secondary | ICD-10-CM | POA: Diagnosis not present

## 2019-11-19 DIAGNOSIS — O24410 Gestational diabetes mellitus in pregnancy, diet controlled: Secondary | ICD-10-CM | POA: Diagnosis not present

## 2019-11-19 DIAGNOSIS — Z20822 Contact with and (suspected) exposure to covid-19: Secondary | ICD-10-CM | POA: Diagnosis not present

## 2019-11-19 LAB — CBC
HCT: 33.6 % — ABNORMAL LOW (ref 36.0–46.0)
Hemoglobin: 11.4 g/dL — ABNORMAL LOW (ref 12.0–15.0)
MCH: 26 pg (ref 26.0–34.0)
MCHC: 33.9 g/dL (ref 30.0–36.0)
MCV: 76.5 fL — ABNORMAL LOW (ref 80.0–100.0)
Platelets: 301 10*3/uL (ref 150–400)
RBC: 4.39 MIL/uL (ref 3.87–5.11)
RDW: 15.4 % (ref 11.5–15.5)
WBC: 10.8 10*3/uL — ABNORMAL HIGH (ref 4.0–10.5)
nRBC: 0 % (ref 0.0–0.2)

## 2019-11-19 LAB — RPR: RPR Ser Ql: NONREACTIVE

## 2019-11-19 LAB — TYPE AND SCREEN
ABO/RH(D): A POS
Antibody Screen: NEGATIVE

## 2019-11-19 LAB — SARS CORONAVIRUS 2 (TAT 6-24 HRS): SARS Coronavirus 2: NEGATIVE

## 2019-11-20 ENCOUNTER — Encounter: Payer: Self-pay | Admitting: *Deleted

## 2019-11-20 ENCOUNTER — Ambulatory Visit (HOSPITAL_BASED_OUTPATIENT_CLINIC_OR_DEPARTMENT_OTHER): Payer: Medicaid Other

## 2019-11-20 ENCOUNTER — Ambulatory Visit: Payer: Medicaid Other | Admitting: *Deleted

## 2019-11-20 DIAGNOSIS — D563 Thalassemia minor: Secondary | ICD-10-CM

## 2019-11-20 DIAGNOSIS — Z862 Personal history of diseases of the blood and blood-forming organs and certain disorders involving the immune mechanism: Secondary | ICD-10-CM

## 2019-11-20 DIAGNOSIS — O99213 Obesity complicating pregnancy, third trimester: Secondary | ICD-10-CM

## 2019-11-20 DIAGNOSIS — O24410 Gestational diabetes mellitus in pregnancy, diet controlled: Secondary | ICD-10-CM

## 2019-11-20 DIAGNOSIS — E669 Obesity, unspecified: Secondary | ICD-10-CM | POA: Diagnosis not present

## 2019-11-20 DIAGNOSIS — O24415 Gestational diabetes mellitus in pregnancy, controlled by oral hypoglycemic drugs: Secondary | ICD-10-CM

## 2019-11-20 DIAGNOSIS — Z148 Genetic carrier of other disease: Secondary | ICD-10-CM

## 2019-11-20 DIAGNOSIS — Z3A37 37 weeks gestation of pregnancy: Secondary | ICD-10-CM

## 2019-11-20 DIAGNOSIS — O09293 Supervision of pregnancy with other poor reproductive or obstetric history, third trimester: Secondary | ICD-10-CM

## 2019-11-20 DIAGNOSIS — O99210 Obesity complicating pregnancy, unspecified trimester: Secondary | ICD-10-CM

## 2019-11-21 ENCOUNTER — Encounter (HOSPITAL_COMMUNITY): Payer: Self-pay | Admitting: Obstetrics and Gynecology

## 2019-11-21 ENCOUNTER — Other Ambulatory Visit: Payer: Self-pay

## 2019-11-21 ENCOUNTER — Inpatient Hospital Stay (HOSPITAL_COMMUNITY): Payer: Medicaid Other | Admitting: Anesthesiology

## 2019-11-21 ENCOUNTER — Inpatient Hospital Stay (HOSPITAL_COMMUNITY)
Admission: RE | Admit: 2019-11-21 | Discharge: 2019-11-23 | DRG: 785 | Disposition: A | Discharge status: Home / Self Care | Payer: Medicaid Other | Attending: Obstetrics and Gynecology | Admitting: Obstetrics and Gynecology

## 2019-11-21 ENCOUNTER — Encounter (HOSPITAL_COMMUNITY)
Admission: RE | Disposition: A | Discharge status: Home / Self Care | Payer: Self-pay | Source: Home / Self Care | Attending: Obstetrics and Gynecology

## 2019-11-21 ENCOUNTER — Encounter: Payer: Medicaid Other | Admitting: Physical Therapy

## 2019-11-21 DIAGNOSIS — D582 Other hemoglobinopathies: Secondary | ICD-10-CM | POA: Diagnosis present

## 2019-11-21 DIAGNOSIS — O24425 Gestational diabetes mellitus in childbirth, controlled by oral hypoglycemic drugs: Secondary | ICD-10-CM | POA: Diagnosis present

## 2019-11-21 DIAGNOSIS — O3660X0 Maternal care for excessive fetal growth, unspecified trimester, not applicable or unspecified: Secondary | ICD-10-CM | POA: Diagnosis present

## 2019-11-21 DIAGNOSIS — O0993 Supervision of high risk pregnancy, unspecified, third trimester: Secondary | ICD-10-CM

## 2019-11-21 DIAGNOSIS — D573 Sickle-cell trait: Secondary | ICD-10-CM | POA: Diagnosis present

## 2019-11-21 DIAGNOSIS — O24419 Gestational diabetes mellitus in pregnancy, unspecified control: Secondary | ICD-10-CM | POA: Diagnosis present

## 2019-11-21 DIAGNOSIS — O358XX0 Maternal care for other (suspected) fetal abnormality and damage, not applicable or unspecified: Secondary | ICD-10-CM | POA: Diagnosis present

## 2019-11-21 DIAGNOSIS — E669 Obesity, unspecified: Secondary | ICD-10-CM | POA: Diagnosis present

## 2019-11-21 DIAGNOSIS — O99214 Obesity complicating childbirth: Secondary | ICD-10-CM | POA: Diagnosis present

## 2019-11-21 DIAGNOSIS — O9902 Anemia complicating childbirth: Secondary | ICD-10-CM | POA: Diagnosis present

## 2019-11-21 DIAGNOSIS — Z98891 History of uterine scar from previous surgery: Secondary | ICD-10-CM

## 2019-11-21 DIAGNOSIS — Z3A00 Weeks of gestation of pregnancy not specified: Secondary | ICD-10-CM | POA: Diagnosis not present

## 2019-11-21 DIAGNOSIS — O24429 Gestational diabetes mellitus in childbirth, unspecified control: Secondary | ICD-10-CM | POA: Diagnosis not present

## 2019-11-21 DIAGNOSIS — Z3A37 37 weeks gestation of pregnancy: Secondary | ICD-10-CM

## 2019-11-21 DIAGNOSIS — Z87898 Personal history of other specified conditions: Secondary | ICD-10-CM

## 2019-11-21 DIAGNOSIS — Z9851 Tubal ligation status: Secondary | ICD-10-CM

## 2019-11-21 DIAGNOSIS — O3663X0 Maternal care for excessive fetal growth, third trimester, not applicable or unspecified: Secondary | ICD-10-CM | POA: Diagnosis present

## 2019-11-21 DIAGNOSIS — D563 Thalassemia minor: Secondary | ICD-10-CM | POA: Diagnosis present

## 2019-11-21 DIAGNOSIS — O99210 Obesity complicating pregnancy, unspecified trimester: Secondary | ICD-10-CM | POA: Diagnosis present

## 2019-11-21 DIAGNOSIS — Z302 Encounter for sterilization: Secondary | ICD-10-CM

## 2019-11-21 DIAGNOSIS — F1291 Cannabis use, unspecified, in remission: Secondary | ICD-10-CM

## 2019-11-21 LAB — GLUCOSE, CAPILLARY: Glucose-Capillary: 93 mg/dL (ref 70–99)

## 2019-11-21 SURGERY — Surgical Case
Anesthesia: Spinal

## 2019-11-21 MED ORDER — PRENATAL MULTIVITAMIN CH
1.0000 | ORAL_TABLET | Freq: Every day | ORAL | Status: DC
  Administered 2019-11-22 – 2019-11-23 (×2): 1 via ORAL
  Filled 2019-11-21 (×2): qty 1

## 2019-11-21 MED ORDER — ONDANSETRON HCL 4 MG/2ML IJ SOLN
INTRAMUSCULAR | Status: DC | PRN
  Administered 2019-11-21 (×2): 4 mg via INTRAVENOUS

## 2019-11-21 MED ORDER — BUPIVACAINE IN DEXTROSE 0.75-8.25 % IT SOLN
INTRATHECAL | Status: DC | PRN
  Administered 2019-11-21: 1.8 mL via INTRATHECAL

## 2019-11-21 MED ORDER — FENTANYL CITRATE (PF) 100 MCG/2ML IJ SOLN: 25.0000 ug | INTRAMUSCULAR | Status: DC | PRN

## 2019-11-21 MED ORDER — PHENYLEPHRINE HCL-NACL 20-0.9 MG/250ML-% IV SOLN
INTRAVENOUS | Status: DC | PRN
  Administered 2019-11-21: 60 ug/min via INTRAVENOUS

## 2019-11-21 MED ORDER — NALBUPHINE HCL 10 MG/ML IJ SOLN: 5.0000 mg | Freq: Once | INTRAMUSCULAR | Status: DC | PRN

## 2019-11-21 MED ORDER — KETOROLAC TROMETHAMINE 30 MG/ML IJ SOLN
INTRAMUSCULAR | Status: AC
  Filled 2019-11-21: qty 1

## 2019-11-21 MED ORDER — ACETAMINOPHEN 10 MG/ML IV SOLN: 1000.0000 mg | Freq: Once | INTRAVENOUS | Status: DC | PRN

## 2019-11-21 MED ORDER — KETOROLAC TROMETHAMINE 30 MG/ML IJ SOLN
30.0000 mg | Freq: Four times a day (QID) | INTRAMUSCULAR | Status: AC | PRN
  Administered 2019-11-22: 30 mg via INTRAVENOUS
  Filled 2019-11-21: qty 1

## 2019-11-21 MED ORDER — LACTATED RINGERS IV SOLN: INTRAVENOUS | Status: DC

## 2019-11-21 MED ORDER — SENNOSIDES-DOCUSATE SODIUM 8.6-50 MG PO TABS
2.0000 | ORAL_TABLET | ORAL | Status: DC
  Administered 2019-11-22 (×2): 2 via ORAL
  Filled 2019-11-21 (×2): qty 2

## 2019-11-21 MED ORDER — OXYTOCIN-SODIUM CHLORIDE 30-0.9 UT/500ML-% IV SOLN
INTRAVENOUS | Status: DC | PRN
  Administered 2019-11-21: 300 mL via INTRAVENOUS

## 2019-11-21 MED ORDER — FENTANYL CITRATE (PF) 100 MCG/2ML IJ SOLN
INTRAMUSCULAR | Status: DC | PRN
Start: 2019-11-21 — End: 2019-11-21
  Administered 2019-11-21: 15 ug via INTRATHECAL

## 2019-11-21 MED ORDER — METOCLOPRAMIDE HCL 5 MG/ML IJ SOLN
INTRAMUSCULAR | Status: AC
  Filled 2019-11-21: qty 2

## 2019-11-21 MED ORDER — SCOPOLAMINE 1 MG/3DAYS TD PT72
MEDICATED_PATCH | TRANSDERMAL | Status: AC
  Filled 2019-11-21: qty 1

## 2019-11-21 MED ORDER — KETOROLAC TROMETHAMINE 30 MG/ML IJ SOLN: 30.0000 mg | Freq: Four times a day (QID) | INTRAMUSCULAR | Status: AC | PRN

## 2019-11-21 MED ORDER — NALBUPHINE HCL 10 MG/ML IJ SOLN
5.0000 mg | INTRAMUSCULAR | Status: DC | PRN
  Administered 2019-11-21: 5 mg via INTRAVENOUS
  Filled 2019-11-21: qty 1

## 2019-11-21 MED ORDER — MORPHINE SULFATE (PF) 0.5 MG/ML IJ SOLN
INTRAMUSCULAR | Status: DC | PRN
Start: 2019-11-21 — End: 2019-11-21
  Administered 2019-11-21: .15 ug via INTRATHECAL

## 2019-11-21 MED ORDER — MORPHINE SULFATE (PF) 0.5 MG/ML IJ SOLN
INTRAMUSCULAR | Status: AC
  Filled 2019-11-21: qty 10

## 2019-11-21 MED ORDER — SODIUM CHLORIDE 0.9% FLUSH: 3.0000 mL | INTRAVENOUS | Status: DC | PRN

## 2019-11-21 MED ORDER — DEXTROSE 5 % IV SOLN
INTRAVENOUS | Status: AC
  Filled 2019-11-21: qty 3000

## 2019-11-21 MED ORDER — NALOXONE HCL 4 MG/10ML IJ SOLN
1.0000 ug/kg/h | INTRAVENOUS | Status: DC | PRN
  Filled 2019-11-21: qty 5

## 2019-11-21 MED ORDER — NALBUPHINE HCL 10 MG/ML IJ SOLN: 5.0000 mg | INTRAMUSCULAR | Status: DC | PRN

## 2019-11-21 MED ORDER — ENOXAPARIN SODIUM 60 MG/0.6ML ~~LOC~~ SOLN
60.0000 mg | SUBCUTANEOUS | Status: DC
  Administered 2019-11-22 – 2019-11-23 (×2): 60 mg via SUBCUTANEOUS
  Filled 2019-11-21 (×2): qty 0.6

## 2019-11-21 MED ORDER — ACETAMINOPHEN 500 MG PO TABS
1000.0000 mg | ORAL_TABLET | Freq: Four times a day (QID) | ORAL | Status: AC
  Administered 2019-11-21 – 2019-11-22 (×3): 1000 mg via ORAL
  Filled 2019-11-21 (×4): qty 2

## 2019-11-21 MED ORDER — POVIDONE-IODINE 10 % EX SWAB: 2.0000 "application " | Freq: Once | CUTANEOUS | Status: DC

## 2019-11-21 MED ORDER — DIBUCAINE (PERIANAL) 1 % EX OINT: 1.0000 "application " | TOPICAL_OINTMENT | CUTANEOUS | Status: DC | PRN

## 2019-11-21 MED ORDER — ZOLPIDEM TARTRATE 5 MG PO TABS: 5.0000 mg | ORAL_TABLET | Freq: Every evening | ORAL | Status: DC | PRN

## 2019-11-21 MED ORDER — ONDANSETRON HCL 4 MG/2ML IJ SOLN
INTRAMUSCULAR | Status: AC
  Filled 2019-11-21: qty 2

## 2019-11-21 MED ORDER — DEXTROSE 5 % IV SOLN: 3.0000 g | INTRAVENOUS | Status: DC

## 2019-11-21 MED ORDER — ONDANSETRON HCL 4 MG/2ML IJ SOLN: 4.0000 mg | Freq: Three times a day (TID) | INTRAMUSCULAR | Status: DC | PRN

## 2019-11-21 MED ORDER — SIMETHICONE 80 MG PO CHEW
80.0000 mg | CHEWABLE_TABLET | ORAL | Status: DC
  Administered 2019-11-22 (×2): 80 mg via ORAL
  Filled 2019-11-21 (×2): qty 1

## 2019-11-21 MED ORDER — FENTANYL CITRATE (PF) 100 MCG/2ML IJ SOLN
INTRAMUSCULAR | Status: AC
  Filled 2019-11-21: qty 2

## 2019-11-21 MED ORDER — TETANUS-DIPHTH-ACELL PERTUSSIS 5-2.5-18.5 LF-MCG/0.5 IM SUSP: 0.5000 mL | Freq: Once | INTRAMUSCULAR | Status: DC

## 2019-11-21 MED ORDER — DEXAMETHASONE SODIUM PHOSPHATE 4 MG/ML IJ SOLN
INTRAMUSCULAR | Status: DC | PRN
  Administered 2019-11-21: 4 mg via INTRAVENOUS

## 2019-11-21 MED ORDER — DIPHENHYDRAMINE HCL 25 MG PO CAPS: 25.0000 mg | ORAL_CAPSULE | ORAL | Status: DC | PRN

## 2019-11-21 MED ORDER — OXYTOCIN-SODIUM CHLORIDE 30-0.9 UT/500ML-% IV SOLN
INTRAVENOUS | Status: AC
  Filled 2019-11-21: qty 500

## 2019-11-21 MED ORDER — DIPHENHYDRAMINE HCL 50 MG/ML IJ SOLN
12.5000 mg | INTRAMUSCULAR | Status: DC | PRN
  Administered 2019-11-22: 12.5 mg via INTRAVENOUS
  Filled 2019-11-21: qty 1

## 2019-11-21 MED ORDER — SIMETHICONE 80 MG PO CHEW: 80.0000 mg | CHEWABLE_TABLET | ORAL | Status: DC | PRN

## 2019-11-21 MED ORDER — SIMETHICONE 80 MG PO CHEW
80.0000 mg | CHEWABLE_TABLET | Freq: Three times a day (TID) | ORAL | Status: DC
  Administered 2019-11-21 – 2019-11-23 (×5): 80 mg via ORAL
  Filled 2019-11-21 (×5): qty 1

## 2019-11-21 MED ORDER — OXYCODONE HCL 5 MG PO TABS
5.0000 mg | ORAL_TABLET | ORAL | Status: DC | PRN
  Administered 2019-11-22 (×2): 5 mg via ORAL
  Administered 2019-11-22 (×2): 10 mg via ORAL
  Administered 2019-11-23 (×3): 5 mg via ORAL
  Filled 2019-11-21 (×4): qty 1
  Filled 2019-11-21: qty 2
  Filled 2019-11-21: qty 1
  Filled 2019-11-21: qty 2

## 2019-11-21 MED ORDER — SODIUM CHLORIDE 0.9 % IV SOLN
8.0000 mg | Freq: Four times a day (QID) | INTRAVENOUS | Status: DC | PRN
  Administered 2019-11-21: 8 mg via INTRAVENOUS
  Filled 2019-11-21 (×2): qty 4

## 2019-11-21 MED ORDER — NALOXONE HCL 0.4 MG/ML IJ SOLN: 0.4000 mg | INTRAMUSCULAR | Status: DC | PRN

## 2019-11-21 MED ORDER — LACTATED RINGERS IV SOLN: INTRAVENOUS | Status: DC | PRN

## 2019-11-21 MED ORDER — MENTHOL 3 MG MT LOZG: 1.0000 | LOZENGE | OROMUCOSAL | Status: DC | PRN

## 2019-11-21 MED ORDER — KETOROLAC TROMETHAMINE 30 MG/ML IJ SOLN
30.0000 mg | Freq: Once | INTRAMUSCULAR | Status: AC | PRN
  Administered 2019-11-21: 30 mg via INTRAVENOUS

## 2019-11-21 MED ORDER — SCOPOLAMINE 1 MG/3DAYS TD PT72
1.0000 | MEDICATED_PATCH | Freq: Once | TRANSDERMAL | Status: DC
  Administered 2019-11-21: 1.5 mg via TRANSDERMAL

## 2019-11-21 MED ORDER — WITCH HAZEL-GLYCERIN EX PADS: 1.0000 "application " | MEDICATED_PAD | CUTANEOUS | Status: DC | PRN

## 2019-11-21 MED ORDER — METOCLOPRAMIDE HCL 5 MG/ML IJ SOLN
INTRAMUSCULAR | Status: DC | PRN
  Administered 2019-11-21: 10 mg via INTRAVENOUS

## 2019-11-21 MED ORDER — DEXAMETHASONE SODIUM PHOSPHATE 4 MG/ML IJ SOLN
INTRAMUSCULAR | Status: AC
  Filled 2019-11-21: qty 1

## 2019-11-21 MED ORDER — AMISULPRIDE (ANTIEMETIC) 5 MG/2ML IV SOLN
10.0000 mg | Freq: Once | INTRAVENOUS | Status: AC
  Administered 2019-11-21: 10 mg via INTRAVENOUS
  Filled 2019-11-21 (×2): qty 4

## 2019-11-21 MED ORDER — PHENYLEPHRINE HCL-NACL 20-0.9 MG/250ML-% IV SOLN
INTRAVENOUS | Status: AC
  Filled 2019-11-21: qty 250

## 2019-11-21 MED ORDER — OXYTOCIN-SODIUM CHLORIDE 30-0.9 UT/500ML-% IV SOLN: 2.5000 [IU]/h | INTRAVENOUS | Status: AC

## 2019-11-21 MED ORDER — DIPHENHYDRAMINE HCL 25 MG PO CAPS: 25.0000 mg | ORAL_CAPSULE | Freq: Four times a day (QID) | ORAL | Status: DC | PRN

## 2019-11-21 MED ORDER — DEXTROSE 5 % IV SOLN
INTRAVENOUS | Status: DC | PRN
  Administered 2019-11-21: 3 g via INTRAVENOUS

## 2019-11-21 MED ORDER — PHENYLEPHRINE 40 MCG/ML (10ML) SYRINGE FOR IV PUSH (FOR BLOOD PRESSURE SUPPORT)
PREFILLED_SYRINGE | INTRAVENOUS | Status: AC
  Filled 2019-11-21: qty 10

## 2019-11-21 MED ORDER — COCONUT OIL OIL: 1.0000 "application " | TOPICAL_OIL | Status: DC | PRN

## 2019-11-21 SURGICAL SUPPLY — 34 items
BENZOIN TINCTURE PRP APPL 2/3 (GAUZE/BANDAGES/DRESSINGS) ×2 IMPLANT
CHLORAPREP W/TINT 26ML (MISCELLANEOUS) ×2 IMPLANT
CLAMP CORD UMBIL (MISCELLANEOUS) IMPLANT
CLOTH BEACON ORANGE TIMEOUT ST (SAFETY) ×2 IMPLANT
DRSG OPSITE POSTOP 4X10 (GAUZE/BANDAGES/DRESSINGS) ×2 IMPLANT
ELECT REM PT RETURN 9FT ADLT (ELECTROSURGICAL) ×2
ELECTRODE REM PT RTRN 9FT ADLT (ELECTROSURGICAL) ×1 IMPLANT
EXTRACTOR VACUUM M CUP 4 TUBE (SUCTIONS) IMPLANT
GLOVE BIOGEL PI IND STRL 7.0 (GLOVE) ×2 IMPLANT
GLOVE BIOGEL PI IND STRL 7.5 (GLOVE) ×2 IMPLANT
GLOVE BIOGEL PI INDICATOR 7.0 (GLOVE) ×2
GLOVE BIOGEL PI INDICATOR 7.5 (GLOVE) ×2
GLOVE ECLIPSE 7.5 STRL STRAW (GLOVE) ×2 IMPLANT
GOWN STRL REUS W/TWL LRG LVL3 (GOWN DISPOSABLE) ×6 IMPLANT
KIT ABG SYR 3ML LUER SLIP (SYRINGE) IMPLANT
NEEDLE HYPO 25X5/8 SAFETYGLIDE (NEEDLE) IMPLANT
NS IRRIG 1000ML POUR BTL (IV SOLUTION) ×2 IMPLANT
PACK C SECTION WH (CUSTOM PROCEDURE TRAY) ×2 IMPLANT
PAD ABD 7.5X8 STRL (GAUZE/BANDAGES/DRESSINGS) ×2 IMPLANT
PAD OB MATERNITY 4.3X12.25 (PERSONAL CARE ITEMS) ×2 IMPLANT
PENCIL SMOKE EVAC W/HOLSTER (ELECTROSURGICAL) ×2 IMPLANT
RTRCTR C-SECT PINK 25CM LRG (MISCELLANEOUS) ×2 IMPLANT
SPONGE GAUZE 4X4 12PLY STER LF (GAUZE/BANDAGES/DRESSINGS) ×4 IMPLANT
STRIP CLOSURE SKIN 1/2X4 (GAUZE/BANDAGES/DRESSINGS) ×2 IMPLANT
SUT PLAIN 2 0 XLH (SUTURE) ×2 IMPLANT
SUT VIC AB 0 CT1 36 (SUTURE) ×2 IMPLANT
SUT VIC AB 0 CTX 36 (SUTURE) ×6
SUT VIC AB 0 CTX36XBRD ANBCTRL (SUTURE) ×3 IMPLANT
SUT VIC AB 2-0 CT1 27 (SUTURE) ×1
SUT VIC AB 2-0 CT1 TAPERPNT 27 (SUTURE) ×1 IMPLANT
SUT VIC AB 4-0 KS 27 (SUTURE) ×2 IMPLANT
TOWEL OR 17X24 6PK STRL BLUE (TOWEL DISPOSABLE) ×2 IMPLANT
TRAY FOLEY W/BAG SLVR 14FR LF (SET/KITS/TRAYS/PACK) ×2 IMPLANT
WATER STERILE IRR 1000ML POUR (IV SOLUTION) ×2 IMPLANT

## 2019-11-21 NOTE — Anesthesia Procedure Notes (Signed)
Spinal  Patient location during procedure: OR Start time: 11/21/2019 9:34 AM End time: 11/21/2019 9:44 AM Staffing Performed: anesthesiologist  Anesthesiologist: Elmer Picker, MD Preanesthetic Checklist Completed: patient identified, IV checked, risks and benefits discussed, surgical consent, monitors and equipment checked, pre-op evaluation and timeout performed Spinal Block Patient position: sitting Prep: DuraPrep and site prepped and draped Patient monitoring: cardiac monitor, continuous pulse ox and blood pressure Approach: midline Location: L3-4 Injection technique: single-shot Needle Needle type: Pencan  Needle gauge: 24 G Needle length: 9 cm Assessment Sensory level: T6 Additional Notes Functioning IV was confirmed and monitors were applied. Sterile prep and drape, including hand hygiene and sterile gloves were used. The patient was positioned and the spine was prepped. The skin was anesthetized with lidocaine.  Free flow of clear CSF was obtained prior to injecting local anesthetic into the CSF.  The spinal needle aspirated freely following injection.  The needle was carefully withdrawn.  The patient tolerated the procedure well.

## 2019-11-21 NOTE — Discharge Summary (Signed)
Postpartum Discharge Summary  Date of Service updated 11/23/19     Patient Name: Sarah Mayo DOB: 01-27-1991 MRN: [id number]  Date of admission: 11/21/2019 Delivery date:11/21/2019  Delivering provider: Laurey Arrow [place]  Date of discharge: 11/23/2019  Admitting diagnosis: Large for gestational age fetus affecting management of mother [O36.60X0] Status post primary low transverse cesarean section [Z98.891] Intrauterine pregnancy: [redacted]w[redacted]d     Secondary diagnosis:  Active Problems:   Sickle cell trait (HCC)   Hemoglobin C trait ([place])   History of marijuana use   Supervision of high risk pregnancy in third trimester   Obesity during pregnancy, antepartum   Alpha thalassemia silent carrier   Gestational diabetes mellitus, antepartum   LGA (large for gestational age) fetus affecting management of mother, third trimester   Cesarean delivery delivered   Large for gestational age fetus affecting management of mother   Status post primary low transverse cesarean section   History of bilateral tubal ligation  Additional problems: none    Discharge diagnosis: Term Pregnancy Delivered                                              Post partum procedures:none Augmentation: N/A Complications: None  Hospital course: Sceduled C/S   29 y.o. yo G3P3003 at [redacted]w[redacted]d was admitted to the hospital 11/21/2019 for scheduled cesarean section with the following indication:Macrosomia.Delivery details are as follows:  Membrane Rupture Time/Date: 10:08 AM ,11/21/2019   Delivery Method:C-Section, Low Transverse  Details of operation can be found in separate operative note.  Patient had an uncomplicated postpartum course.  She is ambulating, tolerating a regular diet, passing flatus, and urinating well. Patient is discharged home in stable condition on  11/23/19        Newborn Data: Birth date:11/21/2019  Birth time:10:09 AM  Gender:Female  Living status:Living  Apgars:8 ,9  Weight:4925 g     Magnesium Sulfate  received: No BMZ received: No Rhophylac:N/A MMR:No T-DaP: declined Flu: No Transfusion:No  Physical exam  Vitals:   11/22/19 0821 11/22/19 1436 11/22/19 2011 11/23/19 0535  BP: 113/74 128/64 134/76 127/68  Pulse: 77 76 85 86  Resp: $Remo'18 18 15 18  'Lyahg$ Temp: 98.2 F (36.8 C) 98.6 F (37 C) 98 F (36.7 C) 98 F (36.7 C)  TempSrc: Oral Oral Oral Oral  SpO2: 98% 100% 99% 98%  Weight:      Height:       General: alert, cooperative and no distress Lochia: appropriate Uterine Fundus: firm Incision: Healing well with no significant drainage DVT Evaluation: No evidence of DVT seen on physical exam. Labs: Lab Results  Component Value Date   WBC 12.8 (H) 11/22/2019   HGB 10.0 (L) 11/22/2019   HCT 29.5 (L) 11/22/2019   MCV 77.2 (L) 11/22/2019   PLT 260 11/22/2019   CMP Latest Ref Rng & Units 11/22/2019  Glucose 70 - 99 mg/dL 125(H)  BUN 6 - 20 mg/dL -  Creatinine 0.44 - 1.00 mg/dL 0.64  Sodium 135 - 145 mmol/L -  Potassium 3.5 - 5.1 mmol/L -  Chloride 98 - 111 mmol/L -  CO2 22 - 32 mmol/L -  Calcium 8.9 - 10.3 mg/dL -  Total Protein 6.5 - 8.1 g/dL -  Total Bilirubin 0.3 - 1.2 mg/dL -  Alkaline Phos 38 - 126 U/L -  AST 15 - 41 U/L -  ALT 0 - 44 U/L -   Edinburgh Score: Edinburgh Postnatal Depression Scale Screening Tool 11/23/2019  I have been able to laugh and see the funny side of things. 0  I have looked forward with enjoyment to things. 0  I have blamed myself unnecessarily when things went wrong. 0  I have been anxious or worried for no good reason. 0  I have felt scared or panicky for no good reason. 0  Things have been getting on top of me. 0  I have been so unhappy that I have had difficulty sleeping. 0  I have felt sad or miserable. 0  I have been so unhappy that I have been crying. 0  The thought of harming myself has occurred to me. 0  Edinburgh Postnatal Depression Scale Total 0     After visit meds:  Allergies as of 11/23/2019   No Known Allergies      Medication List    STOP taking these medications   Accu-Chek Guide test strip Generic drug: glucose blood   Accu-Chek Softclix Lancets lancets   aspirin EC 81 MG tablet   Comfort Fit Maternity Supp Sm Misc   metFORMIN 500 MG 24 hr tablet Commonly known as: Glucophage XR   mupirocin ointment 2 % Commonly known as: Bactroban     TAKE these medications   acetaminophen 500 MG tablet Commonly known as: TYLENOL Take 2 tablets (1,000 mg total) by mouth every 6 (six) hours.   Blood Pressure Kit Devi 1 kit by Does not apply route as needed.   ibuprofen 600 MG tablet Commonly known as: ADVIL Take 1 tablet (600 mg total) by mouth every 6 (six) hours as needed for fever or mild pain.   polyethylene glycol 17 g packet Commonly known as: MiraLax Take 17 g by mouth daily.   Prenate Mini 18-0.6-0.4-350 MG Caps Take 1 tablet by mouth daily.        Discharge home in stable condition Infant Feeding: Bottle and Breast Infant Disposition:home with mother Discharge instruction: per After Visit Summary and Postpartum booklet. Activity: Advance as tolerated. Pelvic rest for 6 weeks.  Diet: routine diet Future Appointments: Future Appointments  Date Time Provider [place]  11/29/2019  1:20 PM [place] None  01/01/2020  9:00 AM [facility] None  01/01/2020  9:15 AM Cephas Darby, MD [place] None   Follow up Visit:   Please schedule this patient for a In person postpartum visit in 6 weeks with the following provider: Any provider. Additional Postpartum F/U:2 hour GTT at 6-week visit and Incision check 1 week  High risk pregnancy complicated by: GDM Delivery mode:  C-Section, Low Transverse  Anticipated Birth Control:  BTL done [facility]   08/26/2092 Arrie Senate, MD

## 2019-11-21 NOTE — Op Note (Signed)
Sarah Mayo PROCEDURE DATE: 11/21/2019  PREOPERATIVE DIAGNOSIS: Intrauterine pregnancy at  [redacted]w[redacted]d weeks gestation; macrosomia  POSTOPERATIVE DIAGNOSIS: The same  PROCEDURE: primary Low Transverse Cesarean Section  SURGEON:  Dr. Shonna Chock  ASSISTANT: Dr Casper Harrison   INDICATIONS: Sarah Mayo is a 29 y.o. G3P3003 at [redacted]w[redacted]d scheduled for cesarean section secondary to macrosomia.  The risks of cesarean section discussed with the patient included but were not limited to: bleeding which may require transfusion or reoperation; infection which may require antibiotics; injury to bowel, bladder, ureters or other surrounding organs; injury to the fetus; need for additional procedures including hysterectomy in the event of a life-threatening hemorrhage; placental abnormalities wth subsequent pregnancies, incisional problems, thromboembolic phenomenon and other postoperative/anesthesia complications. The patient concurred with the proposed plan, giving informed written consent for the procedure.    FINDINGS:  Viable female infant in cephalic presentation.  Apgars 8 and 8, weight, 10 pounds and 15 ounces.  Clear amniotic fluid.  Intact placenta, three vessel cord.  Normal uterus, fallopian tubes and ovaries bilaterally.  ANESTHESIA:    Spinal INTRAVENOUS FLUIDS:2000 ml ESTIMATED BLOOD LOSS: 577 ml URINE OUTPUT:  500 ml SPECIMENS: Placenta sent to L&D COMPLICATIONS: None immediate  PROCEDURE IN DETAIL:  The patient received intravenous antibiotics and had sequential compression devices applied to her lower extremities while in the preoperative area.  She was then taken to the operating room where spinal anesthesia was administered (epidural anesthesia was dosed up to surgical level) and was found to be adequate. She was then placed in a dorsal supine position with a leftward tilt, and prepped and draped in a sterile manner.  A foley catheter was placed into her bladder and attached to constant  gravity, which drained clear fluid throughout.  After an adequate timeout was performed, a Pfannenstiel skin incision was made with scalpel and carried through to the underlying layer of fascia. The fascia was incised in the midline and this incision was extended bluntly. Kocher clamps were applied to the superior aspect of the fascial incision and the underlying rectus muscles were dissected off bluntly. A similar process was carried out on the inferior aspect of the facial incision. The rectus muscles were separated in the midline bluntly and the peritoneum was entered bluntly. An Alexis retractor was placed to aid in visualization of the uterus.  Attention was turned to the lower uterine segment where a transverse hysterotomy was made with a scalpel and extended bilaterally bluntly. The infant was successfully delivered, and cord was clamped and cut and infant was handed over to awaiting neonatology team. Uterine massage was then administered and the placenta delivered intact with three-vessel cord. The uterus was then cleared of clot and debris.  The hysterotomy was closed with 0 Vicryl in a running locked fashion, and an imbricating layer was also placed with a 0 Vicryl. Overall, excellent hemostasis was noted.   Attention was then turned to the right fallopian tube. The right fallopian tube was identified including the fimbriated end.  The right fallopian tube was grasped and a 2-1/2 cm segment was removed using the modified Pomeroy technique.  There was good hemostasis.  Attention was then turned to the left fallopian tube which was identified including the fimbriated end.  A 2-1/2 cm segment in the distal isthmic and ampullary region portion of the tube was removed again using a modified Pomeroy technique.  There was good hemostasis.  The abdomen and the pelvis were cleared of all clot and debris and the  Jon Gills was removed. Hemostasis was confirmed on all surfaces. The fascia was then closed using 0  Vicryl in a running fashion. The subcutaneous layer was reapproximated with plain gut and the skin was closed with 4-0 vicryl. The patient tolerated the procedure well. Sponge, lap, instrument and needle counts were correct x 2. She was taken to the recovery room in stable condition.    Gita Kudo, MD 11/21/2019 11:15 AM

## 2019-11-21 NOTE — Lactation Note (Signed)
This note was copied from a baby's chart. Lactation Consultation Note  Patient Name: Sarah Mayo FWYOV'Z Date: 11/21/2019  [name] now 6 hours old.  Infant LGA and early term. Mom has blue vomit bag beside of her and feeling sick. Mom reports has not breastfed yet due to feeling sick.  RN feeding infant formula due to mom not feeling well.  Mom reports waiting on her medicine for nausea. Asked mom if okay to talk with her or wanted me to come back later.  Mom reports now is fine. Mom is P3.  Mom reports did not try and breastfeed her first baby.  Mom reports tried to breastfeed with her second baby and felt she did not have enough milk.  Mom reports felt her milk come to volume but not like she did with her first baby.  Mom reprots she breastfed about 1 and a half months with her first.  Mom reports had GDM with her second baby as well.  Mom had questions about whether Lactation cookies increase your milk supply and other things to do to increase supply. Urged mom to breastfeed as soon as feels up to it that its removal/stimulation right now that helps her have a healthy milk supply for later.  Reminded mom it was not an all or nothing thing that if she did not have enough with this baby she could do both breastfeeding and formula feeding.  Discussed initiating massage/hand expression and pumping to breastfeeding.  Urged her to start either breastfeeding and/or pumping as soon as she felt up to it.  Urged her to call lactation as needed.     Maternal Data    Feeding Feeding Type: Bottle Fed - Formula Nipple Type: Slow - flow  LATCH Score                   Interventions    Lactation Tools Discussed/Used     Consult Status      [name] [name] 11/21/2019, 4:22 PM

## 2019-11-21 NOTE — Anesthesia Postprocedure Evaluation (Signed)
Anesthesia Post Note  Patient: Sarah Mayo  Procedure(s) Performed: CESAREAN SECTION (N/A )     Patient location during evaluation: PACU Anesthesia Type: Spinal Level of consciousness: oriented and awake and alert Pain management: pain level controlled Vital Signs Assessment: post-procedure vital signs reviewed and stable Respiratory status: spontaneous breathing, respiratory function stable and patient connected to nasal cannula oxygen Cardiovascular status: blood pressure returned to baseline and stable Postop Assessment: no headache, no backache and no apparent nausea or vomiting Anesthetic complications: no   No complications documented.  Last Vitals:  Vitals:   11/21/19 1230 11/21/19 1244  BP: 133/77 137/79  Pulse: 73 77  Resp: 17 18  Temp:  36.5 C  SpO2: 100% 98%    Last Pain:  Vitals:   11/21/19 1244  TempSrc: Oral  PainSc: 0-No pain   Pain Goal:    LLE Motor Response: Purposeful movement (11/21/19 1230) LLE Sensation: Numbness (11/21/19 1230) RLE Motor Response: Purposeful movement (11/21/19 1230) RLE Sensation: Numbness (11/21/19 1230)     Epidural/Spinal Function Cutaneous sensation: Normal sensation (11/21/19 1244), Patient able to flex knees: Yes (11/21/19 1244), Patient able to lift hips off bed: Yes (11/21/19 1244), Back pain beyond tenderness at insertion site: No (11/21/19 1244), Progressively worsening motor and/or sensory loss: No (11/21/19 1244), Bowel and/or bladder incontinence post epidural: No (11/21/19 1244)  [place] Vuncannon L Lamekia Nolden

## 2019-11-21 NOTE — Progress Notes (Signed)
Called about post-op nausea and vomiting. Persistent nausea and several episodes small volume nbnb emesis. Per rn pt was given amisulpride, scopolamine, and zofran in post-op but per mar no zofran given. Abdomen soft, bleeding minimal, bandage c/d/i, vitals wnl. Will order IV zofran and monitor.

## 2019-11-21 NOTE — H&P (Addendum)
LABOR AND DELIVERY ADMISSION HISTORY AND PHYSICAL NOTE  Sarah Mayo is a 29 y.o. female G50P2002 with IUP at [redacted]w[redacted]d by 7 wk u/s presenting for cesarean section for LGA fetus in the setting of GDM.   No complaints.  She reports positive fetal movement. She denies leakage of fluid or vaginal bleeding.  Prenatal History/Complications:  Past Medical History: Past Medical History:  Diagnosis Date  . Gestational diabetes   . Sickle cell trait [facility])     Past Surgical History: Past Surgical History:  Procedure Laterality Date  . NO PAST SURGERIES      Obstetrical History: OB History    Gravida  3   Para  2   Term  2   Preterm      AB      Living  2     SAB      TAB      Ectopic      Multiple  0   Live Births  2           Social History: Social History   Socioeconomic History  . Marital status: Married    Spouse name: Not on file  . Number of children: Not on file  . Years of education: Not on file  . Highest education level: Not on file  Occupational History  . Not on file  Tobacco Use  . Smoking status: Never Smoker  . Smokeless tobacco: Never Used  Vaping Use  . Vaping Use: Never used  Substance and Sexual Activity  . Alcohol use: No    Comment: Social, Last drink August 2018  . Drug use: No    Types: Marijuana    Comment: quit with pregnancy  . Sexual activity: Yes    Birth control/protection: None  Other Topics Concern  . Not on file  Social History Narrative  . Not on file   Social Determinants of Health   Financial Resource Strain:   . Difficulty of Paying Living Expenses: Not on file  Food Insecurity:   . Worried About Charity fundraiser in the Last Year: Not on file  . Ran Out of Food in the Last Year: Not on file  Transportation Needs:   . Lack of Transportation (Medical): Not on file  . Lack of Transportation (Non-Medical): Not on file  Physical Activity:   . Days of Exercise per Week: Not on file  . Minutes of  Exercise per Session: Not on file  Stress:   . Feeling of Stress : Not on file  Social Connections:   . Frequency of Communication with Friends and Family: Not on file  . Frequency of Social Gatherings with Friends and Family: Not on file  . Attends Religious Services: Not on file  . Active Member of Clubs or Organizations: Not on file  . Attends Archivist Meetings: Not on file  . Marital Status: Not on file    Family History: Family History  Problem Relation Age of Onset  . Asthma Brother   . Sickle cell trait Mother     Allergies: No Known Allergies  Medications Prior to Admission  Medication Sig Dispense Refill Last Dose  . metFORMIN (GLUCOPHAGE XR) 500 MG 24 hr tablet Take 2 tablets (1,000 mg total) by mouth 2 (two) times daily after a meal. Take after breakfast and after supper. 120 tablet 11 Past Week at Unknown time  . Prenat-FeCbn-FeAsp-Meth-FA-DHA (PRENATE MINI) 18-0.6-0.4-350 MG CAPS Take 1 tablet by mouth daily. [place]  capsule 7 11/21/2019 at Unknown time  . Accu-Chek Softclix Lancets lancets Use as instructed 100 each 12   . aspirin EC 81 MG tablet Take 1 tablet (81 mg total) by mouth daily. Take after 12 weeks for prevention of preeclampsia later in pregnancy. Start taking after 05/31/2019 (Patient not taking: Reported on 11/16/2019) 300 tablet 0 Not Taking at Unknown time  . Blood Pressure Monitoring (BLOOD PRESSURE KIT) DEVI 1 kit by Does not apply route as needed. 1 each 0   . Elastic Bandages & Supports (COMFORT FIT MATERNITY SUPP SM) MISC 1 Units by Does not apply route daily as needed. 1 each 0   . glucose blood (ACCU-CHEK GUIDE) test strip Use as instructed 100 each 12   . mupirocin ointment (BACTROBAN) 2 % Apply 1 application topically 2 (two) times daily. (Patient not taking: Reported on 11/16/2019) 22 g 0 Not Taking at Unknown time     Review of Systems   All systems reviewed and negative except as stated in HPI  Blood pressure 134/90, temperature 98.7  F (37.1 C), temperature source Oral, resp. rate 20, height 5\' 9"  (1.753 m), weight 120.2 kg, last menstrual period 03/01/2019, SpO2 98 %. General appearance: alert, cooperative and appears stated age Lungs: clear to auscultation bilaterally Heart: regular rate and rhythm Abdomen: soft, non-tender; bowel sounds normal Extremities: No calf swelling or tenderness Presentation: cephalic Fetal monitoring: 154      Prenatal labs: ABO, Rh: --/--/A POS (08/30 09-11-1998) Antibody: NEG (08/30 0839) Rubella: 2.72 (03/04 0939) RPR: NON REACTIVE (08/30 0839)  HBsAg: Negative (03/04 0939)  HIV: Non Reactive (06/24 0924)  GBS: Negative/-- (08/24 1105)  2 hr Glucola: abnormal Genetic screening:  Nips negative Anatomy 03[birth year]-1989: wnl  Prenatal Transfer Tool  Maternal Diabetes: Yes:  Diabetes Type:  Insulin/Medication controlled Genetic Screening: normal Maternal Ultrasounds/Referrals: Fetal Kidney Anomalies Fetal Ultrasounds or other Referrals:  None Maternal Substance Abuse:  Hx thc Significant Maternal Medications:  Metformin, aspirin Significant Maternal Lab Results: Group B Strep negative  Results for orders placed or performed during the hospital encounter of 11/21/19 (from the past 24 hour(s))  Glucose, capillary   Collection Time: 11/21/19  8:38 AM  Result Value Ref Range   Glucose-Capillary 93 70 - 99 mg/dL   Comment 1 Notify RN     Patient Active Problem List   Diagnosis Date Noted  . Gestational diabetes mellitus, antepartum 09/14/2019  . LGA (large for gestational age) fetus affecting management of mother, third trimester 09/14/2019  . Alpha thalassemia silent carrier 06/11/2019  . Obesity during pregnancy, antepartum 05/24/2019  . Supervision of high risk pregnancy in third trimester 05/23/2019  . BMI 37.0-37.9, adult 07/23/2014  . Sickle cell trait (HCC) 07/23/2014  . 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 History of marijuana use 07/23/2014    Assessment: Sarah Mayo  is a 29 y.o. G3P2002 at [redacted]w[redacted]d here for PLTCS for macrosomia (EFW > 4500 g) in setting of GDM. EFW @ 36 wks 4754 g. MFM advised delivery between 37 and 38 wks, closer to 37 weeks if GDM not well controlled, and indeed patient has not been checking glucose regularly. Fasting glucose this morning 93.  The risks of cesarean section were discussed with the patient including but were not limited to: bleeding which may require transfusion or reoperation; infection which may require antibiotics; injury to bowel, bladder, ureters or other surrounding organs; injury to the fetus; need for additional procedures including hysterectomy in the event of a life-threatening hemorrhage;  placental abnormalities wth subsequent pregnancies, incisional problems, thromboembolic phenomenon and other postoperative/anesthesia complications.  Patient also desires permanent sterilization.  Other reversible forms of contraception were discussed with patient; she declines all other modalities. Risks of procedure discussed with patient including but not limited to: risk of regret, permanence of method, bleeding, infection, injury to surrounding organs and need for additional procedures.  Failure risk of 1-2% with increased risk of ectopic gestation if pregnancy occurs was also discussed with patient.  The patient concurred with the proposed plan, giving informed written consent for the procedures.  Patient has been NPO since midnight she will remain NPO for procedure. Anesthesia and OR aware.  Preoperative prophylactic antibiotics and SCDs ordered on call to the OR.  To OR when ready.  #Fetal pyelectasis: noted in prenatal transfer tool #Sickle cell trait: FOB reports being tested and negative #ID:  gbs neg #MOF: br/bot #MOC: BTL #Circ:  yes  Patsy Lager Finnlee Guarnieri 11/21/2019, 8:39 AM

## 2019-11-21 NOTE — Anesthesia Preprocedure Evaluation (Signed)
Anesthesia Evaluation  Patient identified by MRN, date of birth, ID band Patient awake    Reviewed: Allergy & Precautions, NPO status , Patient's Chart, lab work & pertinent test results  Airway Mallampati: III  TM Distance: >3 FB Neck ROM: Full    Dental no notable dental hx.    Pulmonary neg pulmonary ROS,    Pulmonary exam normal breath sounds clear to auscultation       Cardiovascular negative cardio ROS Normal cardiovascular exam Rhythm:Regular Rate:Normal     Neuro/Psych negative neurological ROS  negative psych ROS   GI/Hepatic negative GI ROS, (+)     substance abuse  marijuana use,   Endo/Other  negative endocrine ROSdiabetes, GestationalObese BMI 39  Renal/GU negative Renal ROS  negative genitourinary   Musculoskeletal negative musculoskeletal ROS (+)   Abdominal   Peds  Hematology negative hematology ROS (+) Sickle cell trait ,   Anesthesia Other Findings Primary C/S for fetal macrosomia in setting of gDM  Reproductive/Obstetrics (+) Pregnancy                             Anesthesia Physical Anesthesia Plan  ASA: III  Anesthesia Plan: Spinal   Post-op Pain Management:    Induction:   PONV Risk Score and Plan: Treatment may vary due to age or medical condition  Airway Management Planned: Natural Airway  Additional Equipment:   Intra-op Plan:   Post-operative Plan:   Informed Consent: I have reviewed the patients History and Physical, chart, labs and discussed the procedure including the risks, benefits and alternatives for the proposed anesthesia with the patient or authorized representative who has indicated his/her understanding and acceptance.     Dental advisory given  Plan Discussed with: CRNA  Anesthesia Plan Comments:         Anesthesia Quick Evaluation

## 2019-11-21 NOTE — Progress Notes (Signed)
Patient nauseated and vomiting. DO called to assess patient. Zofran ordered.

## 2019-11-21 NOTE — Transfer of Care (Signed)
Immediate Anesthesia Transfer of Care Note  Patient: Sarah Mayo  Procedure(s) Performed: CESAREAN SECTION (N/A )  Patient Location: PACU  Anesthesia Type:Spinal  Level of Consciousness: awake, alert  and oriented  Airway & Oxygen Therapy: Patient Spontanous Breathing  Post-op Assessment: Report given to RN and Post -op Vital signs reviewed and stable  Post vital signs: Reviewed and stable  Last Vitals:  Vitals Value Taken Time  BP 130/69 11/21/19 1117  Temp    Pulse 82 11/21/19 1120  Resp 21 11/21/19 1120  SpO2 99 % 11/21/19 1120  Vitals shown include unvalidated device data.  Last Pain:  Vitals:   11/21/19 0829  TempSrc: Oral  PainSc: 0-No pain         Complications: No complications documented.

## 2019-11-22 LAB — CBC
HCT: 29.5 % — ABNORMAL LOW (ref 36.0–46.0)
Hemoglobin: 10 g/dL — ABNORMAL LOW (ref 12.0–15.0)
MCH: 26.2 pg (ref 26.0–34.0)
MCHC: 33.9 g/dL (ref 30.0–36.0)
MCV: 77.2 fL — ABNORMAL LOW (ref 80.0–100.0)
Platelets: 260 10*3/uL (ref 150–400)
RBC: 3.82 MIL/uL — ABNORMAL LOW (ref 3.87–5.11)
RDW: 15.1 % (ref 11.5–15.5)
WBC: 12.8 10*3/uL — ABNORMAL HIGH (ref 4.0–10.5)
nRBC: 0 % (ref 0.0–0.2)

## 2019-11-22 LAB — CREATININE, SERUM
Creatinine, Ser: 0.64 mg/dL (ref 0.44–1.00)
GFR calc Af Amer: >60 mL/min (ref >60)
GFR calc non Af Amer: >60 mL/min (ref >60)

## 2019-11-22 LAB — BIRTH TISSUE RECOVERY COLLECTION (PLACENTA DONATION)

## 2019-11-22 LAB — GLUCOSE, RANDOM: Glucose, Bld: 125 mg/dL — ABNORMAL HIGH (ref 70–99)

## 2019-11-22 LAB — SURGICAL PATHOLOGY

## 2019-11-22 LAB — GLUCOSE, CAPILLARY: Glucose-Capillary: 98 mg/dL (ref 70–99)

## 2019-11-22 MED ORDER — IBUPROFEN 600 MG PO TABS
600.0000 mg | ORAL_TABLET | Freq: Four times a day (QID) | ORAL | Status: DC | PRN
  Administered 2019-11-22 – 2019-11-23 (×4): 600 mg via ORAL
  Filled 2019-11-22 (×4): qty 1

## 2019-11-22 MED ORDER — ACETAMINOPHEN 500 MG PO TABS
1000.0000 mg | ORAL_TABLET | Freq: Four times a day (QID) | ORAL | Status: DC
  Administered 2019-11-22 – 2019-11-23 (×5): 1000 mg via ORAL
  Filled 2019-11-22 (×4): qty 2

## 2019-11-22 MED ORDER — IBUPROFEN 600 MG PO TABS: 600.0000 mg | ORAL_TABLET | Freq: Four times a day (QID) | ORAL | Status: DC

## 2019-11-22 NOTE — Lactation Note (Addendum)
This note was copied from a baby's chart. Lactation Consultation Note  Patient Name: Sarah Mayo Date: 11/22/2019 Reason for consult: Follow-up assessment;Mother's request;Difficult latch;1st time breastfeeding P3, LGA ETI infant with -4% weight loss, infant with upper lip tie. Mom with hx of GDM- metformin, Sickle Cell trait , obesity and marijuana use.  Infant has been mainly formula fed mom only latched infant twice since birth. Per mom, she really wants to BF infant she did not BF her other two children. LC entered room, mom on side on bed, was given pain medication by RN and heating packs to help with pain.  Tools given: hand breast pump due mom having flat nipples, 20 mm NS due infant not latching probably due bottle usage and mom's flat nipples and DEBP to help establish and maintain milk supply.  Per mom, she thought she did not have any EBM to give infant, LC reviewed hand expression and mom easily expressed 4 mls of colostrum that was put in a curve tip syringe. Mom pre-pumped her right breast with hand pump prior to latching infant at breast, infant would latch but not form seal nor elicit suck, swallow and breath only hold breast in mouth then break latch.  LC did suck training with infant and notice infant tucks top lip inward and bottom lip inward, LC repeatedly flange lips out with suck training. Mom latched infant again at the breast, mom fitted with 20 mm NS, pre-filled with 0.5 mls of EBM, LC keep flanging top lip out for infant to form seal and extending lower jaw, infant started swallowing and BF for 15 minutes. Mom was pleased that infant BF, mom will continue to  work on latching infant at breast first before offering formula. Mom has only used DEBP once she became discouraged when she did not see colostrum, LC explained to mom colostrum is thick and not uncommon not to see EBM, LC explained to mom the importance of using DEBP to help establish and maintain milk  supply. Mom will do hands on pumping using the DEBP, massaging breast and hand expressing after pumping, mom was pumping when LC in room and had pumped 5 mls and was still pumping when LC left room. Infant was given 18 mls of Similac Advance with iron by curve tip syringe to by LC using a gloved finger to work on suck, swallow, breath coordination and forming a seal and sustaining latch. Mom will latch infant first for each feeding before giving infant formula to help stimulate breast and establish her milk supply.   Mom's plan: 1. Mom will pre-pump breast with hand pump and apply 20 mm NS prior to latching infant at breast. 2. Mom will give infant back any EBM first before giving formula according to infant's age / hours of life, mom will choose either curve tip syringe or bottle nipple her choice. 3. Mom will pump every 3 hours for 15 minutes on initial setting to help establish and maintain milk supply due to using 20 mm NS. 4. Mom will continue to work towards latching infant at breast and will ask RN or LC for assistance with latching infant if needed.  Maternal Data    Feeding Feeding Type: Breast Fed Nipple Type: Slow - flow  LATCH Score Latch: Repeated attempts needed to sustain latch, nipple held in mouth throughout feeding, stimulation needed to elicit sucking reflex.  Audible Swallowing: A few with stimulation  Type of Nipple: Flat  Comfort (Breast/Nipple): Soft / non-tender  Hold (Positioning): Assistance needed to correctly position infant at breast and maintain latch.  LATCH Score: 6  Interventions Interventions: Assisted with latch;Skin to skin;Hand express;Pre-pump if needed;Adjust position;Support pillows;Position options;Expressed milk  Lactation Tools Discussed/Used Tools: Pump;Nipple Shields;64F feeding tube / Syringe Nipple shield size: 20 Breast pump type: Double-Electric Breast Pump   Consult Status Consult Status: Follow-up Date: 11/23/19 Follow-up  type: In-patient    Danelle Earthly 11/22/2019, 10:43 PM

## 2019-11-22 NOTE — Progress Notes (Signed)
Subjective: Postpartum Day #1: Cesarean Delivery & BTL Patient reports tolerating PO; breast and bottlefeeding; denies dizziness with ambulation; had foley removed earlier and was unable to void so is s/p I&O cath x 1; desires circ for her son  Objective: Vital signs in last 24 hours: Temp:  [97.7 F (36.5 C)-98.7 F (37.1 C)] 98.7 F (37.1 C) (09/02 0520) Pulse Rate:  [53-90] 84 (09/02 0520) Resp:  [10-20] 16 (09/02 0630) BP: (109-137)/(57-90) 124/63 (09/02 0520) SpO2:  [97 %-100 %] 99 % (09/02 0630) Weight:  [120.2 kg] 120.2 kg (09/01 0829)  Physical Exam:  General: alert and cooperative Lochia: appropriate Uterine Fundus: firm Incision: pressure dsg intact, dry DVT Evaluation: No evidence of DVT seen on physical exam.  Recent Labs    11/19/19 0839 11/22/19 0537  HGB 11.4* 10.0*  HCT 33.6* 29.5*   Glucose this AM: 125 (unsure if was fasting or not)  Assessment/Plan: Status post Cesarean section. Doing well postoperatively. Order for nursing to notify team if pt unable to void again. Continue current care. Anticipate d/c 11/23/19.  CNM Circumcision Counseling Progress Note  Patient desires circumcision for her female infant.  Circumcision procedure details discussed, risks and benefits of procedure were also discussed.  These include but are not limited to: Benefits of circumcision in men include reduction in the rates of urinary tract infection (UTI), penile cancer, some sexually transmitted infections, penile inflammatory and retractile disorders, as well as easier hygiene.  Risks include bleeding , infection, injury of glans which may lead to penile deformity or urinary tract issues, unsatisfactory cosmetic appearance and other potential complications related to the procedure.  It was emphasized that this is an elective procedure.  Patient wants to proceed with circumcision; written informed consent will be obtained.  Will have MD do circumcision when infant is cleared for such  by peds.  Arabella Merles CNM 11/22/2019   7:05 AM

## 2019-11-22 NOTE — Lactation Note (Signed)
This note was copied from a baby's chart. Lactation Consultation Note Baby 20 hrs old. Mom requested LC to come assist in latching. LC with another pt. So mom gave baby formula which is what the baby has been getting. Baby drank 59 ml so had little interest in breast feeding.  Able to latch a couple of times suckling well on the breast. Mom denies painful latch in cradle position. Mom has flat nipples evert w/stimulation. Very compressible. Hand expression w/colostrum demonstrated. Discussed pumping, mom in agreement.  Mom didn't BF her 2 other children ages 38 and 2 yrs old.  Mom states she has a pump at home.  Mom encouraged to feed baby 8-12 times/24 hours and with feeding cues.  Mom shown how to use DEBP & how to disassemble, clean, & reassemble parts. Mom knows to pump q3h for 15-20 min.  Encouraged to call for assistance or questions. Noted pacifier in bed. Discouraged for 2 weeks.  Patient Name: Sarah Mayo [id number] Date: 11/22/2019 Reason for consult: Mother's request;Early term 37-38.6wks;Maternal endocrine disorder Type of Endocrine Disorder?: Diabetes   Maternal Data    Feeding Feeding Type: Formula Nipple Type: Slow - flow  LATCH Score Latch: Repeated attempts needed to sustain latch, nipple held in mouth throughout feeding, stimulation needed to elicit sucking reflex. (mom had given a bottle so baby wasn't hungry)  Audible Swallowing: None  Type of Nipple: Flat  Comfort (Breast/Nipple): Soft / non-tender  Hold (Positioning): Full assist, staff holds infant at breast  LATCH Score: 4  Interventions Interventions: Breast feeding basics reviewed;Breast compression;Assisted with latch;Adjust position;Skin to skin;Support pillows;DEBP;Breast massage;Position options;Hand express  Lactation Tools Discussed/Used Tools: Pump Breast pump type: Double-Electric Breast Pump Pump Review: Setup, frequency, and cleaning;Milk Storage Initiated by:: Peri Jefferson RN  IBCLC Date initiated:: 11/22/19   Consult Status Consult Status: Follow-up Date: 11/22/19 (in pm) Follow-up type: In-patient    Charyl Dancer 11/22/2019, 6:10 AM

## 2019-11-22 NOTE — Progress Notes (Signed)
Foley catheter was removed on 9/1 at 2245, but pt unable to void within 6 hours of catheter removal despite multiple attempts. Bladder scan read urine retained in bladder and pt verbalized feeling uncomfortable. Straight catheter removed 1100 ml from bladder.   Pt denies discomfort at this time; will continue to monitor.

## 2019-11-23 DIAGNOSIS — Z9851 Tubal ligation status: Secondary | ICD-10-CM

## 2019-11-23 MED ORDER — OXYCODONE HCL 5 MG PO TABS
5.0000 mg | ORAL_TABLET | Freq: Four times a day (QID) | ORAL | 0 refills | Status: DC | PRN
Start: 2019-11-23 — End: 2020-01-01

## 2019-11-23 MED ORDER — IBUPROFEN 600 MG PO TABS: 600.0000 mg | ORAL_TABLET | Freq: Four times a day (QID) | ORAL | 0 refills | Status: DC | PRN

## 2019-11-23 MED ORDER — POLYETHYLENE GLYCOL 3350 17 G PO PACK: 17.0000 g | PACK | Freq: Every day | ORAL | 0 refills | Status: DC

## 2019-11-23 MED ORDER — ACETAMINOPHEN 500 MG PO TABS
1000.0000 mg | ORAL_TABLET | Freq: Four times a day (QID) | ORAL | 0 refills | Status: DC
Start: 2019-11-23 — End: 2020-05-22

## 2019-11-23 NOTE — Lactation Note (Signed)
This note was copied from a baby's chart. Lactation Consultation Note  Patient Name: Sarah Mayo [id number] Date: 11/23/2019 Reason for consult: Follow-up assessment;Early term 37-38.6wks;Infant weight loss;Other (Comment) (5 % weight loss/ post circ) Type of Endocrine Disorder?: Diabetes  Baby is 38 hours old.  Per mom the baby was circ'd and has been sleepy since he came back , last fed at  8:30 - 25 ml for formula.  Baby has not been back to the breast since the [facility] assisted last evening with a NS. Per mom plans to work on breast feeding when she goes home and has a DEBP .  Sore nipple and engorgement prevention and tx reviewed.  LC recommended when she goes home and is working on latching , apply the NS instill the EBM or formula into the top of the NS for an appetizer and allow the baby to feed for 15 - 20 mins and 30 mins max, supplement back with 30 ml and post  pump both breast for 15 -20 mins and use the milk to supplement back to baby. If she is still using a NS to latch - call back for [facility] O/P appt and the [facility] pamphlet given with phone numbers.   LC provided a #20 NS , #24 NS and a curved tip syringe. She already has the DEBP kit and another #20 NS.    Maternal Data    Feeding Feeding Type:  (per mom last fed at 0830)  LATCH Score                   Interventions Interventions: Breast feeding basics reviewed  Lactation Tools Discussed/Used Tools: Pump Breast pump type: Double-Electric Breast Pump Pump Review: Milk Storage   Consult Status Consult Status: Complete Date: 11/23/19    Myer Haff 11/23/2019, 12:25 PM

## 2019-11-23 NOTE — Progress Notes (Addendum)
POSTPARTUM PROGRESS NOTE  POD #2  Subjective:  Sarah Mayo is a 29 y.o. G3P3003 s/p pLTCS at 66w1dfor macrosomnia.  She reports she doing well, however still quite sore at her incision site. No acute events overnight. She denies any problems with ambulating, voiding or Mayo intake. Denies nausea or vomiting. Pain is moderately controlled.  Lochia is mild.  Objective: Blood pressure 127/68, pulse 86, temperature 98 F (36.7 C), temperature source Oral, resp. rate 18, height _0  (1.753 m), weight 120.2 kg, last menstrual period 03/01/2019, SpO2 98 %, unknown if currently breastfeeding.  Physical Exam:  General: alert, cooperative and no distress Chest: no respiratory distress Abdomen: soft, nontender  Uterine Fundus: firm, appropriately tender DVT Evaluation: No calf swelling or tenderness Extremities: no edema Skin: warm, dry; incision clean/dry/intact w/ honeycomb dressing in place  Recent Labs    11/22/19 0537  HGB 10.0*  HCT 29.5*    Assessment/Plan: Sarah D PPerrieris a 29y.o. G3P3003 s/p pLTCS for macrosomnia at 337w1d POD#2 - Doing welll; pain moderately controlled. H/H appropriate  Routine postpartum care  OOB, ambulated  Lovenox for VTE prophylaxis  A2GDM:  Previously on metformin. Glucose 90-125. Will need postpartum 2 hour GTT.   Contraception: BTL Feeding: both   Dispo: Plan for discharge tomorrow.   LOS: 2 days   SaDarrelyn HillockDO Family medicine PGY-3  11/23/2019, 8:30 AM  I personally saw and evaluated the patient, performing the key elements of the service. I developed and verified the management plan that is described in the resident's/student's note, and I agree with the content with my edits above. VSS, HRR&R, Resp unlabored, Legs neg.  FrNigel BertholdCNM 11/24/2019 3:04 PM

## 2019-11-27 ENCOUNTER — Telehealth: Payer: Self-pay | Admitting: *Deleted

## 2019-11-27 NOTE — Telephone Encounter (Signed)
Email sent to [facility] to request follow up for NP appointment.  Peta Peachey     PEC [phone] 

## 2019-11-27 NOTE — Telephone Encounter (Signed)
Contacted patient to complete Transition of Care Assessment: Transition Care Management Follow-up Telephone Call  Date of discharge and from where: 11/23/19, [facility]  How have you been since you were released from the hospital? "ok"  Any questions or concerns? Yes Patient states she was not able to get her Tylenol prescription filled because they were out.She uses [facility] on [address], [place]. The patient states she is continuing to have incisional pain    Items Reviewed:  Did the pt receive and understand the discharge instructions provided? Yes   Medications obtained and verified? Yes  Patient states she was not able to get her Tylenol prescription filled because they were out.She uses [facility] on [address], [place]  Any new allergies since your discharge? No   Dietary orders reviewed? NO  Do you have support at home? Yes family  Functional Questionnaire: (I = Independent and D = Dependent) ADLs: I  Bathing/Dressing- I  Meal Prep- I  Eating- I  Maintaining continence- I  Transferring/Ambulation- I  Managing Meds- I  Follow up appointments reviewed:   PCP Hospital f/u appt confirmed? No Patient would like to be assigned a PCP  Specialist Hospital f/u appt confirmed? Yes  Scheduled to see [facility] RN on 11/29/19 @ 1320 and Dr Kelby Aline on 01/01/20 at 0915  Are transportation arrangements needed? No   If their condition worsens, is the pt aware to call PCP or go to the Emergency Dept.? YES  Was the patient provided with contact information for the PCP's office or ED? YES  Was to pt encouraged to call back with questions or concerns? YES  Order placed for [facility] due to patient's inability to obtain prescription.  Burnard Bunting, RN, BSN, CCRN Patient Engagement Center [phone]

## 2019-11-29 ENCOUNTER — Ambulatory Visit (INDEPENDENT_AMBULATORY_CARE_PROVIDER_SITE_OTHER): Payer: Medicaid Other

## 2019-11-29 ENCOUNTER — Other Ambulatory Visit: Payer: Self-pay | Admitting: Pharmacist

## 2019-11-29 ENCOUNTER — Other Ambulatory Visit: Payer: Self-pay

## 2019-11-29 VITALS — BP 116/78 | HR 79 | Wt 246.0 lb

## 2019-11-29 DIAGNOSIS — Z5189 Encounter for other specified aftercare: Secondary | ICD-10-CM

## 2019-11-29 NOTE — Progress Notes (Signed)
Subjective:     Sarah Mayo is a 29 y.o. female who presents to the clinic 1 weeks status post LTCS for Incision check. Eating a regular diet without difficulty. Bowel movements are normal. Pain is controlled with current analgesics. Medications being used: OxyCODONE, Ibuprofen.   Review of Systems Pertinent items are noted in HPI.    Objective:    BP 116/78   Pulse 79   Wt 246 lb (111.6 kg)   LMP 03/01/2019   Breastfeeding Yes   BMI 36.33 kg/m  General:  alert  Abdomen: soft, bowel sounds active, non-tender  Incision:   healing well, no drainage, no erythema, no hernia, no seroma, no swelling, no dehiscence, incision well approximated     Assessment:    Doing well postoperatively.   Plan:    1. Continue any current medications. 2. Wound care discussed. 3. Activity restrictions: none 4. Anticipated return to work: not applicable. 5. Follow up: 5 weeks for PP visit.   Maretta Bees, RMA

## 2019-11-29 NOTE — Patient Instructions (Signed)
1) Continue incision care including daily cleansing and keeping it open to air. 2) Purchase over-the-counter medications for constipation.  Miralax and docusate sodium is recommended. 3) Keep appointment for 6-week check on Oct. 12.

## 2019-11-29 NOTE — Chronic Care Management (AMB) (Signed)
Care Coordination - Pharmacy  11/29/2019  Sarah Mayo [birth year] [id number]  Subjective:  Sarah Mayo is an 29 y.o. year old female who is a primary patient of Patient, No Pcp Per.    [name] was given information about Medicaid Managed Care team care coordination services today. [name] agreed to services and verbal consent obtained  Review of patient status, laboratory and other test data was performed as part of evaluation for provision of services.  SDOH:   SDOH Screenings   Alcohol Screen:   . Last Alcohol Screening Score (AUDIT): Not on file  Depression (PHQ2-9):   . PHQ-2 Score: Not on file  Financial Resource Strain:   . Difficulty of Paying Living Expenses: Not on file  Food Insecurity:   . Worried About Charity fundraiser in the Last Year: Not on file  . Ran Out of Food in the Last Year: Not on file  Housing:   . Last Housing Risk Score: Not on file  Physical Activity:   . Days of Exercise per Week: Not on file  . Minutes of Exercise per Session: Not on file  Social Connections:   . Frequency of Communication with Friends and Family: Not on file  . Frequency of Social Gatherings with Friends and Family: Not on file  . Attends Religious Services: Not on file  . Active Member of Clubs or Organizations: Not on file  . Attends Archivist Meetings: Not on file  . Marital Status: Not on file  Stress:   . Feeling of Stress : Not on file  Tobacco Use: Low Risk   . Smoking Tobacco Use: Never Smoker  . Smokeless Tobacco Use: Never Used  Transportation Needs:   . Film/video editor (Medical): Not on file  . Lack of Transportation (Non-Medical): Not on file     Objective:  No Known Allergies  Medications:  Medications Reviewed Today    Reviewed by Leonides Schanz Peterson Ao, [facility] (Pharmacist) on 11/29/19 at 1710  Med List Status: <None>  Medication Order Taking? Sig Documenting Provider Last Dose Status Informant  acetaminophen  (TYLENOL) 500 MG tablet [id number]  Take 2 tablets (1,000 mg total) by mouth every 6 (six) hours. Arrie Senate, MD  Active   Blood Pressure Monitoring (BLOOD PRESSURE KIT) DEVI [id number] No 1 kit by Does not apply route as needed. Lajean Manes, CNM Taking Active Self       Patient not taking:      Discontinued 04/07/19 1403        Patient not taking:      Discontinued 04/07/19 1403   ibuprofen (ADVIL) 600 MG tablet [id number] No Take 1 tablet (600 mg total) by mouth every 6 (six) hours as needed for fever or mild pain. Arrie Senate, MD Taking Active   oxyCODONE (OXY IR/ROXICODONE) 5 MG immediate release tablet [id number] No Take 1 tablet (5 mg total) by mouth every 6 (six) hours as needed for severe pain. Patriciaann Clan, DO Taking Active   polyethylene glycol (MIRALAX) 17 g packet [id number]  Take 17 g by mouth daily. Arrie Senate, MD  Active   Prenat-FeCbn-FeAsp-Meth-FA-DHA [facility] MINI) 18-0.6-0.4-350 MG CAPS [id number] No Take 1 tablet by mouth daily. Lajean Manes, CNM Taking Active Self          Assessment:  Goals Addressed            This Visit's Progress   . Patient Stated, "I want to  keep my c-section incision clean and dry so it will heal."       Medicaid Managed Care  Medication Assistance CARE PLAN ENTRY (see longitudinal plan of care for additional care plan information)  Current Barriers:  . The patient is home recovering from a c-section delivery. . Patient has mild constipation. . No financial barriers interfering with the access of her medications.  Pharmacist Clinical Goal(s):  Marland Kitchen Over the next 6 weeks, patient will work with PharmD and providers to continue on over the counter meds for pain and constipation. . Patient will continue with incision care.  This include daily cleansing and leaving it open to air as instructed by physician.  No topical medications have been prescribed. . Patient did have GDM.  No longer required to check  blood sugars at home. . Patient was not recommended to take bp readings at home. . Breastfeeding is going well.  Patient will continue breastfeeding and pumping breast milk.  Interventions: . Comprehensive medication review completed; medication list updated in electronic medical record.  Bertram Savin care team collaboration (see longitudinal plan of care)  Patient Self Care Activities:  . Patient has the resources to purchase her prescription and over-the-counter medications. . The patient is home doing well, caring for her new baby and her two other children, and resting comfortably. . Patient has a supportive spouse. . Patient had doctor appointment today to check her incision.  Everything looks good. . 6-week check is scheduled for October 12. . Patient states she is not experiencing and postpartum depression.        Plan: 1) Patient will continue incision care including daily cleansing and leaving it open to air dry.  She will continue taking oxycodone and Tylenol as needed for pain. 2) No blood sugar or blood pressure monitoring required at this time at home. 3) Patient will purchase over-the-counter meds for constipation at the pharmacy.  Docusate sodium and Miralax is recommended. 4) Patient has 6-week check scheduled for Oct.12. 5) Patient will continue breastfeeding and pumping milk.

## 2019-11-29 NOTE — Progress Notes (Signed)
Patient was assessed and managed by nursing staff during this encounter. I have reviewed the chart and agree with the documentation and plan. I have also made any necessary editorial changes.  Cherre Robins, CNM 11/29/2019 4:46 PM

## 2019-11-30 ENCOUNTER — Encounter: Payer: Medicaid Other | Admitting: Physical Therapy

## 2019-11-30 DIAGNOSIS — N1330 Unspecified hydronephrosis: Secondary | ICD-10-CM | POA: Diagnosis not present

## 2019-12-03 ENCOUNTER — Encounter: Payer: Medicaid Other | Admitting: Physical Therapy

## 2019-12-06 ENCOUNTER — Encounter: Payer: Medicaid Other | Admitting: Physical Therapy

## 2019-12-10 ENCOUNTER — Encounter: Payer: Medicaid Other | Admitting: Physical Therapy

## 2019-12-13 ENCOUNTER — Encounter: Payer: Medicaid Other | Admitting: Physical Therapy

## 2019-12-17 ENCOUNTER — Encounter: Payer: Medicaid Other | Admitting: Physical Therapy

## 2019-12-18 ENCOUNTER — Other Ambulatory Visit: Payer: Self-pay

## 2019-12-20 ENCOUNTER — Encounter: Payer: Medicaid Other | Admitting: Physical Therapy

## 2020-01-01 ENCOUNTER — Encounter: Payer: Self-pay | Admitting: Obstetrics and Gynecology

## 2020-01-01 ENCOUNTER — Other Ambulatory Visit: Payer: Medicaid Other

## 2020-01-01 ENCOUNTER — Ambulatory Visit (INDEPENDENT_AMBULATORY_CARE_PROVIDER_SITE_OTHER): Payer: Medicaid Other | Admitting: Obstetrics and Gynecology

## 2020-01-01 ENCOUNTER — Other Ambulatory Visit: Payer: Self-pay

## 2020-01-01 DIAGNOSIS — Z8632 Personal history of gestational diabetes: Secondary | ICD-10-CM | POA: Diagnosis not present

## 2020-01-01 NOTE — Progress Notes (Signed)
    Post Partum Visit Note  Sarah Mayo is a 29 y.o. G18P3003 female who presents for a postpartum visit. She is 6 weeks postpartum following a primary cesarean section.  I have fully reviewed the prenatal and intrapartum course. The delivery was at 37.1 gestational weeks.  Anesthesia: spinal. Postpartum course has been doing well. Baby is doing well. Baby is feeding by bottle - Enfamil Gentle. Bleeding no bleeding. Bowel function is normal. Bladder function is normal. Patient is sexually active. Contraception method is tubal ligation. Postpartum depression screening: negative, score 0.   The pregnancy intention screening data noted above was reviewed. Potential methods of contraception were discussed. The patient elected to proceed with Female Sterilization.      The following portions of the patient's history were reviewed and updated as appropriate: allergies, current medications, past family history, past medical history, past social history, past surgical history and problem list.  Review of Systems A comprehensive review of systems was negative.    Objective:  Last menstrual period 03/01/2019, currently breastfeeding.  General:  alert and cooperative   Breasts:  inspection negative, no nipple discharge or bleeding, no masses or nodularity palpable  Lungs: clear to auscultation bilaterally  Heart:  regular rate and rhythm, S1, S2 normal, no murmur, click, rub or gallop  Abdomen: soft, non-tender; bowel sounds normal; no masses,  no organomegaly and Incision:  clean, dry, and intact   Vulva:  not evaluated  Vagina: not evaluated  Cervix:  no evaluated  Corpus: not examined  Adnexa:  not evaluated  Rectal Exam: Not performed.        Assessment:    Normal postpartum exam. Pap smear not done at today's visit.   Plan:   Essential components of care per ACOG recommendations:  1.  Mood and well being: Patient with negative depression screening today. Reviewed local  resources for support.  - Patient does not use tobacco - hx of drug use? No  2. Infant care and feeding:  -Patient currently breastmilk feeding? No -Social determinants of health (SDOH) reviewed in EPIC. No concerns  3. Sexuality, contraception and birth spacing - Patient does not want a pregnancy in the next year.  Desired family size is completed children.  - Reviewed forms of contraception in tiered fashion. Patient desired bilateral tubal ligation today.   - Discussed birth spacing of 18 months  4. Sleep and fatigue -Encouraged family/partner/community support of 4 hrs of uninterrupted sleep to help with mood and fatigue  5. Physical Recovery  - Discussed patients delivery and gestational diabetes - Patient is safe to resume physical and sexual activity  6.  Health Maintenance - Last pap smear done 05/24/2019 and was normal  7. Gestational diabetes, uncontrolled with oral agents. - Patient for 2hr GTT today.  Darrin Nipper. Gerri Spore, MD [facility] for [employer], [facility]

## 2020-01-02 ENCOUNTER — Other Ambulatory Visit: Payer: Self-pay

## 2020-01-02 ENCOUNTER — Encounter: Payer: Self-pay | Admitting: Obstetrics & Gynecology

## 2020-01-02 DIAGNOSIS — Z0000 Encounter for general adult medical examination without abnormal findings: Secondary | ICD-10-CM

## 2020-01-02 DIAGNOSIS — R7303 Prediabetes: Secondary | ICD-10-CM | POA: Insufficient documentation

## 2020-01-02 LAB — GLUCOSE TOLERANCE, 2 HOURS
Glucose, 2 hour: 106 mg/dL (ref 65–139)
Glucose, GTT - Fasting: 106 mg/dL — ABNORMAL HIGH (ref 65–99)

## 2020-01-02 NOTE — Progress Notes (Signed)
Referral to PCP for patient per Dr. Mervyn Skeeters. Pt was made aware via mychart.

## 2020-05-14 ENCOUNTER — Emergency Department (HOSPITAL_COMMUNITY)
Admission: EM | Admit: 2020-05-14 | Discharge: 2020-05-14 | Disposition: A | Discharge status: Home / Self Care | Payer: Medicaid Other | Attending: Emergency Medicine | Admitting: Emergency Medicine

## 2020-05-14 ENCOUNTER — Emergency Department (HOSPITAL_COMMUNITY): Payer: Medicaid Other

## 2020-05-14 ENCOUNTER — Ambulatory Visit: Payer: Self-pay | Admitting: *Deleted

## 2020-05-14 ENCOUNTER — Encounter (HOSPITAL_COMMUNITY): Payer: Self-pay | Admitting: Emergency Medicine

## 2020-05-14 DIAGNOSIS — R531 Weakness: Secondary | ICD-10-CM | POA: Diagnosis not present

## 2020-05-14 DIAGNOSIS — R202 Paresthesia of skin: Secondary | ICD-10-CM | POA: Insufficient documentation

## 2020-05-14 DIAGNOSIS — R29818 Other symptoms and signs involving the nervous system: Secondary | ICD-10-CM | POA: Diagnosis not present

## 2020-05-14 DIAGNOSIS — R9431 Abnormal electrocardiogram [ECG] [EKG]: Secondary | ICD-10-CM | POA: Diagnosis not present

## 2020-05-14 DIAGNOSIS — M79609 Pain in unspecified limb: Secondary | ICD-10-CM

## 2020-05-14 LAB — DIFFERENTIAL
Abs Immature Granulocytes: 0.02 10*3/uL (ref 0.00–0.07)
Basophils Absolute: 0.1 10*3/uL (ref 0.0–0.1)
Basophils Relative: 1 %
Eosinophils Absolute: 0.1 10*3/uL (ref 0.0–0.5)
Eosinophils Relative: 1 %
Immature Granulocytes: 0 %
Lymphocytes Relative: 33 %
Lymphs Abs: 2.7 10*3/uL (ref 0.7–4.0)
Monocytes Absolute: 0.4 10*3/uL (ref 0.1–1.0)
Monocytes Relative: 5 %
Neutro Abs: 5 10*3/uL (ref 1.7–7.7)
Neutrophils Relative %: 60 %

## 2020-05-14 LAB — COMPREHENSIVE METABOLIC PANEL
ALT: 29 U/L (ref 0–44)
AST: 16 U/L (ref 15–41)
Albumin: 3.6 g/dL (ref 3.5–5.0)
Alkaline Phosphatase: 61 U/L (ref 38–126)
Anion gap: 10 (ref 5–15)
BUN: 13 mg/dL (ref 6–20)
CO2: 22 mmol/L (ref 22–32)
Calcium: 9 mg/dL (ref 8.9–10.3)
Chloride: 106 mmol/L (ref 98–111)
Creatinine, Ser: 0.58 mg/dL (ref 0.44–1.00)
GFR, Estimated: >60 mL/min (ref >60)
Glucose, Bld: 96 mg/dL (ref 70–99)
Potassium: 4 mmol/L (ref 3.5–5.1)
Sodium: 138 mmol/L (ref 135–145)
Total Bilirubin: 0.6 mg/dL (ref 0.3–1.2)
Total Protein: 7.2 g/dL (ref 6.5–8.1)

## 2020-05-14 LAB — I-STAT CHEM 8, ED
BUN: 15 mg/dL (ref 6–20)
Calcium, Ion: 1.22 mmol/L (ref 1.15–1.40)
Chloride: 107 mmol/L (ref 98–111)
Creatinine, Ser: 0.5 mg/dL (ref 0.44–1.00)
Glucose, Bld: 97 mg/dL (ref 70–99)
HCT: 35 % — ABNORMAL LOW (ref 36.0–46.0)
Hemoglobin: 11.9 g/dL — ABNORMAL LOW (ref 12.0–15.0)
Potassium: 4.1 mmol/L (ref 3.5–5.1)
Sodium: 141 mmol/L (ref 135–145)
TCO2: 23 mmol/L (ref 22–32)

## 2020-05-14 LAB — CBG MONITORING, ED: Glucose-Capillary: 97 mg/dL (ref 70–99)

## 2020-05-14 LAB — I-STAT BETA HCG BLOOD, ED (MC, WL, AP ONLY): I-stat hCG, quantitative: <5 m[IU]/mL (ref <5)

## 2020-05-14 LAB — APTT: aPTT: 26 seconds (ref 24–36)

## 2020-05-14 LAB — CBC
HCT: 34.8 % — ABNORMAL LOW (ref 36.0–46.0)
Hemoglobin: 12.5 g/dL (ref 12.0–15.0)
MCH: 28.7 pg (ref 26.0–34.0)
MCHC: 35.9 g/dL (ref 30.0–36.0)
MCV: 79.8 fL — ABNORMAL LOW (ref 80.0–100.0)
Platelets: 369 10*3/uL (ref 150–400)
RBC: 4.36 MIL/uL (ref 3.87–5.11)
RDW: 13.9 % (ref 11.5–15.5)
WBC: 8.2 10*3/uL (ref 4.0–10.5)
nRBC: 0 % (ref 0.0–0.2)

## 2020-05-14 LAB — PROTIME-INR
INR: 1 (ref 0.8–1.2)
Prothrombin Time: 12.3 seconds (ref 11.4–15.2)

## 2020-05-14 MED ORDER — SODIUM CHLORIDE 0.9% FLUSH: 3.0000 mL | Freq: Once | INTRAVENOUS | Status: DC

## 2020-05-14 NOTE — ED Provider Notes (Signed)
[facility] EMERGENCY DEPARTMENT Provider Note   CSN: [id number] Arrival date & time: 05/14/20  6387     History Chief Complaint  Patient presents with  . Arm Pain    Sarah Mayo is a 30 y.o. female.  Sarah Mayo is a G24P3, right-handed 30 y.o. female with a past medical history significant for gestational diabetes who presents today for evaluation of right arm tingling, numbness and weakness. Patient reports right arm tingling, numbness and weakness began Sept 2021. She states it is intermittent, but has progressively been worsening. Describes it as "pins and needles" and "burning." She reports it is worse with bending of the arm and feels she is no longer able to hold her baby with her right arm or wash the dishes using her right hand. She has used a heating pad without relief. Denies neck pain or stiffness. Denies any injuries to neck or right upper extremity. States prior to onset of her right arm paresthesia, she had tingling in her right foot, described as diffuse right foot tingling, intermittent, only ever involving the right foot. Denies ptosis, facial dropping, arthralgia. Denies illicit drug use. Patient came to the ER today due to persistence of symptoms causing concern. No changes in her baseline symptoms.         Past Medical History:  Diagnosis Date  . Alpha thalassemia silent carrier 06/11/2019  . Gestational diabetes    Postpartum 2 hr GTT showed prediabetic state  . 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 History of marijuana use 07/23/2014  . Prediabetes     Patient Active Problem List   Diagnosis Date Noted  . Prediabetes 01/02/2020    Past Surgical History:  Procedure Laterality Date  . CESAREAN SECTION N/A 11/21/2019   Procedure: CESAREAN SECTION;  Surgeon: Kathrynn Running, MD;  Location: [facility] LD ORS;  Service: Obstetrics;  Laterality: N/A;     OB History    Gravida  3   Para  3   Term  3   Preterm      AB       Living  3     SAB      IAB      Ectopic      Multiple  0   Live Births  3           Family History  Problem Relation Age of Onset  . Asthma Brother   . Sickle cell trait Mother     Social History   Tobacco Use  . Smoking status: Never Smoker  . Smokeless tobacco: Never Used  Vaping Use  . Vaping Use: Never used  Substance Use Topics  . Alcohol use: No    Comment: Social, Last drink August 2018  . Drug use: No    Types: Marijuana    Comment: quit with pregnancy    Home Medications Prior to Admission medications   Medication Sig Start Date End Date Taking? Authorizing Provider  acetaminophen (TYLENOL) 500 MG tablet Take 2 tablets (1,000 mg total) by mouth every 6 (six) hours. Patient not taking: Reported on 05/14/2020 11/23/19   Alric Seton, MD  ibuprofen (ADVIL) 600 MG tablet Take 1 tablet (600 mg total) by mouth every 6 (six) hours as needed for fever or mild pain. Patient not taking: No sig reported 11/23/19   Alric Seton, MD  polyethylene glycol (MIRALAX) 17 g packet Take 17 g by mouth daily. Patient not taking: No sig reported 11/23/19  Alric Seton, MD  Prenat-FeCbn-FeAsp-Meth-FA-DHA (PRENATE MINI) 18-0.6-0.4-350 MG CAPS Take 1 tablet by mouth daily. Patient not taking: No sig reported 05/24/19   Sharyon Cable, CNM  drospirenone-ethinyl estradiol (YASMIN 28) 3-0.03 MG tablet Take 1 tablet by mouth daily. Patient not taking: Reported on 02/12/2019 06/06/17 04/07/19  Adam Phenix, MD  fluticasone [facility]) 50 MCG/ACT nasal spray Place 2 sprays into both nostrils daily. Patient not taking: Reported on 02/12/2019 06/13/18 04/07/19  Rennis Harding, PA-C    Allergies    Patient has no known allergies.  Review of Systems   Review of Systems  Constitutional: Negative for fever.  Respiratory: Negative for shortness of breath.   Cardiovascular: Negative for chest pain.  Gastrointestinal: Negative for abdominal pain.  Musculoskeletal:  Negative for neck pain and neck stiffness.  Skin: Negative for rash and wound.  Allergic/Immunologic: Negative for immunocompromised state.  Neurological: Positive for numbness. Negative for weakness.  Psychiatric/Behavioral: Negative for confusion.  All other systems reviewed and are negative.   Physical Exam Updated Vital Signs BP 129/87 (BP Location: Right Arm)   Pulse 68   Temp 98.5 F (36.9 C) (Oral)   Resp 20   SpO2 100%   Physical Exam Vitals and nursing note reviewed.  Constitutional:      General: She is not in acute distress.    Appearance: She is well-developed and well-nourished. She is not diaphoretic.  HENT:     Head: Normocephalic and atraumatic.     Mouth/Throat:     Mouth: Mucous membranes are moist.  Eyes:     Extraocular Movements: Extraocular movements intact.     Conjunctiva/sclera: Conjunctivae normal.     Pupils: Pupils are equal, round, and reactive to light.  Cardiovascular:     Rate and Rhythm: Normal rate and regular rhythm.     Pulses: Normal pulses.     Heart sounds: Normal heart sounds.  Pulmonary:     Effort: Pulmonary effort is normal.     Breath sounds: Normal breath sounds.  Musculoskeletal:        General: No swelling, tenderness or deformity.     Cervical back: Normal range of motion and neck supple. No tenderness.     Right lower leg: No edema.     Left lower leg: No edema.  Skin:    General: Skin is warm and dry.     Findings: No erythema or rash.  Neurological:     General: No focal deficit present.     Mental Status: She is alert and oriented to person, place, and time.     Sensory: No sensory deficit.     Motor: No weakness, tremor, atrophy or pronator drift.     Coordination: Coordination normal.     Gait: Gait normal.     Deep Tendon Reflexes: Reflexes are normal and symmetric. Reflexes normal.  Psychiatric:        Mood and Affect: Mood and affect normal.        Behavior: Behavior normal.     ED Results / Procedures  / Treatments   Labs (all labs ordered are listed, but only abnormal results are displayed) Labs Reviewed  CBC - Abnormal; Notable for the following components:      Result Value   HCT 34.8 (*)    MCV 79.8 (*)    All other components within normal limits  I-STAT CHEM 8, ED - Abnormal; Notable for the following components:   Hemoglobin 11.9 (*)    HCT  35.0 (*)    All other components within normal limits  PROTIME-INR  APTT  DIFFERENTIAL  COMPREHENSIVE METABOLIC PANEL  CBG MONITORING, ED  I-STAT BETA HCG BLOOD, ED ([facility], [facility], [facility] ONLY)    EKG EKG Interpretation  Date/Time:  Wednesday May 14 2020 10:02:39 EST Ventricular Rate:  73 PR Interval:  136 QRS Duration: 74 QT Interval:  404 QTC Calculation: 445 R Axis:   29 Text Interpretation: Normal sinus rhythm Normal ECG Confirmed by Lorre Nick ([id number]) on 05/14/2020 11:28:44 AM   Radiology CT HEAD WO CONTRAST  Result Date: 05/14/2020 CLINICAL DATA:  Acute neuro deficit. EXAM: CT HEAD WITHOUT CONTRAST TECHNIQUE: Contiguous axial images were obtained from the base of the skull through the vertex without intravenous contrast. COMPARISON:  None. FINDINGS: Brain: No evidence of acute infarction, hemorrhage, hydrocephalus, extra-axial collection or mass lesion/mass effect. Vascular: Negative for hyperdense vessel Skull: Negative Sinuses/Orbits: Paranasal sinuses clear.  Negative orbit Other: None IMPRESSION: Negative CT head Electronically Signed   By: Marlan Palau M.D.   On: 05/14/2020 11:31    Procedures Procedures   Medications Ordered in ED Medications  sodium chloride flush (NS) 0.9 % injection 3 mL (has no administration in time range)    ED Course  I have reviewed the triage vital signs and the nursing notes.  Pertinent labs & imaging results that were available during my care of the patient were reviewed by me and considered in my medical decision making (see chart for details).  Clinical Course as of 05/14/20  1224  Wed May 14, 2020  [id number] 30 year old female with right arm pins and needles, intermittent x 5 months without preceding injury. Also right foot pins and needles.  Exam normal, symmetric upper and lower extremity strength, normal reflexes. No cervical Paraspinous tenderness.  Due to complaint of right arm pain, patient received extensive lab work-up per triage protocol including CT of the head which is unremarkable.  Labs reassuring including normal CBC, CMP, hCG is negative. Discussed results with patient, plan is to follow-up with neurology, will give referral today and patient will call to schedule an appointment. [LM]    Clinical Course User Index [LM] Alden Hipp   MDM Rules/Calculators/A&P                          Final Clinical Impression(s) / ED Diagnoses Final diagnoses:  Paresthesia and pain of right extremity    Rx / DC Orders ED Discharge Orders    None       Alden Hipp 05/14/20 1224    Lorre Nick, MD 05/19/20 [phone]

## 2020-05-14 NOTE — Discharge Instructions (Addendum)
Follow up with neurology, call to schedule an appointment.

## 2020-05-14 NOTE — ED Notes (Signed)
Patient verbalizes understanding of discharge instructions. Opportunity for questioning and answers were provided. Pt discharged from ED. 

## 2020-05-14 NOTE — Telephone Encounter (Signed)
Per initiall encounter, "Patient is calling because she has been having pain in her right arm and hands which has been getting worse over the past year after she had her child. She stated she does not have a PCP but is concerned about the pain because it is getting worse. [facility]) suggested to put it in as a clinical call and patient was informed that someone would call her back to discuss further"; contacted pt to discuss symptoms; she complains of intermittent right arm tingling, burning,numbness ; right arm hand to shoulder that startied around 11/21/19; the discomfort is described "burning" and rated 8 out of 10; the pt says she has to sleep with a heating pad; the pt says she has weakness in her right arm; recommendations made per nurse triage protocol; she verbalized understanding and says she is currently walking into the ED.  Reason for Disposition . [1] Numbness or tingling in one or both hands AND [2] is a chronic symptom (recurrent or ongoing AND present > 4 weeks)  Answer Assessment - Initial Assessment Questions 1. SYMPTOM: "What is the main symptom you are concerned about?" (e.g., weakness, numbness)     Numbness2. ONSET: "When did this start?" (minutes, hours, days; while sleeping)     11/21/19 3. LAST NORMAL: "When was the last time you were normal (no symptoms)?"    11/21/19 4. PATTERN "Does this come and go, or has it been constant since it started?"  "Is it present now?"    intermittent 5. CARDIAC SYMPTOMS: "Have you had any of the following symptoms: chest pain, difficulty breathing, palpitations?"     no 6. NEUROLOGIC SYMPTOMS: "Have you had any of the following symptoms: headache, dizziness, vision loss, double vision, changes in speech, unsteady on your feet?"     no 7. OTHER SYMPTOMS: "Do you have any other symptoms?"    Burning, tingling 8. PREGNANCY: "Is there any chance you are pregnant?" "When was your last menstrual period?"    05/12/20  Protocols used: NEUROLOGIC  DEFICIT-A-AH

## 2020-05-14 NOTE — ED Triage Notes (Addendum)
Patient complains of intermittent pain, tingling, weakness and numbness that started in November 2021. Patient states pain started initially in right foot in November and over time has expanded to right arm. Patient alert, oriented, ambulatory, and in no apparent distress at this time. States she was told she was pre-diabetic after having gestational diabetes and delivered in September 2021, has not followed up with an endocrinologist.

## 2020-05-15 ENCOUNTER — Telehealth: Payer: Self-pay

## 2020-05-15 NOTE — Telephone Encounter (Signed)
Transition Care Management Follow-up Telephone Call  Date of discharge and from where: 05/14/2020 from [facility]  How have you been since you were released from the hospital? Pt states that she is still have some arm pain and tingling. Pt is interested is establishing with a PCP. Pt given the number to the [facility]. Pt agreed to call for an appointment.   Any questions or concerns? Yes  Items Reviewed:  Did the pt receive and understand the discharge instructions provided? Yes   Medications obtained and verified? Yes   Other? No   Any new allergies since your discharge? No   Dietary orders reviewed? n/a  Do you have support at home? Yes    Functional Questionnaire: (I = Independent and D = Dependent) ADLs: I  Bathing/Dressing- I  Meal Prep- I  Eating- I  Maintaining continence- I  Transferring/Ambulation- I  Managing Meds- I   Follow up appointments reviewed:   PCP Hospital f/u appt confirmed? No  Pt is interested is establishing with a PCP. Pt given the number to the [facility]. Pt agreed to call for an appointment.  [facility]u [facility]t confirmed? Yes  Scheduled to see Neuro on 07/15/2020.  Are transportation arrangements needed? No   If their condition worsens, is the pt aware to call PCP or go to the Emergency Dept.? Yes  Was the patient provided with contact information for the PCP's office or ED? Yes  Was to pt encouraged to call back with questions or concerns? Yes

## 2020-05-22 ENCOUNTER — Encounter: Payer: Self-pay | Admitting: Nurse Practitioner

## 2020-05-22 ENCOUNTER — Other Ambulatory Visit: Payer: Self-pay

## 2020-05-22 ENCOUNTER — Ambulatory Visit: Payer: Medicaid Other | Admitting: Nurse Practitioner

## 2020-05-22 VITALS — BP 122/55 | HR 82 | Ht 69.0 in | Wt 237.0 lb

## 2020-05-22 DIAGNOSIS — G5601 Carpal tunnel syndrome, right upper limb: Secondary | ICD-10-CM | POA: Diagnosis not present

## 2020-05-22 DIAGNOSIS — Z8632 Personal history of gestational diabetes: Secondary | ICD-10-CM | POA: Diagnosis not present

## 2020-05-22 DIAGNOSIS — Z7689 Persons encountering health services in other specified circumstances: Secondary | ICD-10-CM | POA: Diagnosis not present

## 2020-05-22 DIAGNOSIS — Z8616 Personal history of COVID-19: Secondary | ICD-10-CM | POA: Diagnosis not present

## 2020-05-22 LAB — POCT GLYCOSYLATED HEMOGLOBIN (HGB A1C): Hemoglobin A1C: 4.8 % (ref 4.0–5.6)

## 2020-05-22 NOTE — Patient Instructions (Addendum)
Health Maintenance, Female Adopting a healthy lifestyle and getting preventive care are important in promoting health and wellness. Ask your health care provider about:  The right schedule for you to have regular tests and exams.  Things you can do on your own to prevent diseases and keep yourself healthy. What should I know about diet, weight, and exercise? Eat a healthy diet  Eat a diet that includes plenty of vegetables, fruits, low-fat dairy products, and lean protein.  Do not eat a lot of foods that are high in solid fats, added sugars, or sodium.   Maintain a healthy weight Body mass index (BMI) is used to identify weight problems. It estimates body fat based on height and weight. Your health care provider can help determine your BMI and help you achieve or maintain a healthy weight. Get regular exercise Get regular exercise. This is one of the most important things you can do for your health. Most adults should:  Exercise for at least 150 minutes each week. The exercise should increase your heart rate and make you sweat (moderate-intensity exercise).  Do strengthening exercises at least twice a week. This is in addition to the moderate-intensity exercise.  Spend less time sitting. Even light physical activity can be beneficial. Watch cholesterol and blood lipids Have your blood tested for lipids and cholesterol at 28 years of age, then have this test every 5 years. Have your cholesterol levels checked more often if:  Your lipid or cholesterol levels are high.  You are older than 28 years of age.  You are at high risk for heart disease. What should I know about cancer screening? Depending on your health history and family history, you may need to have cancer screening at various ages. This may include screening for:  Breast cancer.  Cervical cancer.  Colorectal cancer.  Skin cancer.  Lung cancer. What should I know about heart disease, diabetes, and high blood  pressure? Blood pressure and heart disease  High blood pressure causes heart disease and increases the risk of stroke. This is more likely to develop in people who have high blood pressure readings, are of African descent, or are overweight.  Have your blood pressure checked: ? Every 3-5 years if you are 18-39 years of age. ? Every year if you are 40 years old or older. Diabetes Have regular diabetes screenings. This checks your fasting blood sugar level. Have the screening done:  Once every three years after age 40 if you are at a normal weight and have a low risk for diabetes.  More often and at a younger age if you are overweight or have a high risk for diabetes. What should I know about preventing infection? Hepatitis B If you have a higher risk for hepatitis B, you should be screened for this virus. Talk with your health care provider to find out if you are at risk for hepatitis B infection. Hepatitis C Testing is recommended for:  Everyone born from 1945 through 1965.  Anyone with known risk factors for hepatitis C. Sexually transmitted infections (STIs)  Get screened for STIs, including gonorrhea and chlamydia, if: ? You are sexually active and are younger than 28 years of age. ? You are older than 28 years of age and your health care provider tells you that you are at risk for this type of infection. ? Your sexual activity has changed since you were last screened, and you are at increased risk for chlamydia or gonorrhea. Ask your health care provider   if you are at risk.  Ask your health care provider about whether you are at high risk for HIV. Your health care provider may recommend a prescription medicine to help prevent HIV infection. If you choose to take medicine to prevent HIV, you should first get tested for HIV. You should then be tested every 3 months for as long as you are taking the medicine. Pregnancy  If you are about to stop having your period (premenopausal) and  you may become pregnant, seek counseling before you get pregnant.  Take 400 to 800 micrograms (mcg) of folic acid every day if you become pregnant.  Ask for birth control (contraception) if you want to prevent pregnancy. Osteoporosis and menopause Osteoporosis is a disease in which the bones lose minerals and strength with aging. This can result in bone fractures. If you are 41 years old or older, or if you are at risk for osteoporosis and fractures, ask your health care provider if you should:  Be screened for bone loss.  Take a calcium or vitamin D supplement to lower your risk of fractures.  Be given hormone replacement therapy (HRT) to treat symptoms of menopause. Follow these instructions at home: Lifestyle  Do not use any products that contain nicotine or tobacco, such as cigarettes, e-cigarettes, and chewing tobacco. If you need help quitting, ask your health care provider.  Do not use street drugs.  Do not share needles.  Ask your health care provider for help if you need support or information about quitting drugs. Alcohol use  Do not drink alcohol if: ? Your health care provider tells you not to drink. ? You are pregnant, may be pregnant, or are planning to become pregnant.  If you drink alcohol: ? Limit how much you use to 0-1 drink a day. ? Limit intake if you are breastfeeding.  Be aware of how much alcohol is in your drink. In the [place] one drink equals one 12 oz bottle of beer (355 mL), one 5 oz glass of wine (148 mL), or one 1 oz glass of hard liquor (44 mL). General instructions  Schedule regular health, dental, and eye exams.  Stay current with your vaccines.  Tell your health care provider if: ? You often feel depressed. ? You have ever been abused or do not feel safe at home. Summary  Adopting a healthy lifestyle and getting preventive care are important in promoting health and wellness.  Follow your health care provider's instructions about healthy  diet, exercising, and getting tested or screened for diseases.  Follow your health care provider's instructions on monitoring your cholesterol and blood pressure. This information is not intended to replace advice given to you by your health care provider. Make sure you discuss any questions you have with your health care provider. Document Revised: 03/01/2018 Document Reviewed: 03/01/2018     Carpal Tunnel Syndrome  Carpal tunnel syndrome is a condition that causes pain, weakness, and numbness in your hand and arm. Numbness is when you cannot feel an area in your body. The carpal tunnel is a narrow area that is on the palm side of your wrist. Repeated wrist motion or certain diseases may cause swelling in the tunnel. This swelling can pinch the main nerve in the wrist. This nerve is called the median nerve. What are the causes? This condition may be caused by:  Moving your hand and wrist over and over again while doing a task.  Injury to the wrist.  Arthritis.  A sac  of fluid (cyst) or abnormal growth (tumor) in the carpal tunnel.  Fluid buildup during pregnancy.  Use of tools that vibrate. Sometimes the cause is not known. What increases the risk? The following factors may make you more likely to have this condition:  Having a job that makes you do these things: ? Move your hand over and over again. ? Work with tools that vibrate, such as drills or sanders.  Being a woman.  Having diabetes, obesity, thyroid problems, or kidney failure. What are the signs or symptoms? Symptoms of this condition include:  A tingling feeling in your fingers.  Tingling or loss of feeling in your hand.  Pain in your entire arm. This pain may get worse when you bend your wrist and elbow for a long time.  Pain in your wrist that goes up your arm to your shoulder.  Pain that goes down into your palm or fingers.  Weakness in your hands. You may find it hard to grab and hold items. You may  feel worse at night. How is this treated? This condition may be treated with:  Lifestyle changes. You will be asked to stop or change the activity that caused your problem.  Doing exercises and activities that make bones, muscles, and tendons stronger (physical therapy).  Learning how to use your hand again (occupational therapy).  Medicines for pain and swelling. You may have injections in your wrist.  A wrist splint or brace.  Surgery. Follow these instructions at home: If you have a splint or brace:  Wear the splint or brace as told by your doctor. Take it off only as told by your doctor.  Loosen the splint if your fingers: ? Tingle. ? Become numb. ? Turn cold and blue.  Keep the splint or brace clean.  If the splint or brace is not waterproof: ? Do not let it get wet. ? Cover it with a watertight covering when you take a bath or a shower. Managing pain, stiffness, and swelling If told, put ice on the painful area:  If you have a removable splint or brace, remove it as told by your doctor.  Put ice in a plastic bag.  Place a towel between your skin and the bag.  Leave the ice on for 20 minutes, 2-3 times per day. Do not fall asleep with the cold pack on your skin.  Take off the ice if your skin turns bright red. This is very important. If you cannot feel pain, heat, or cold, you have a greater risk of damage to the area. Move your fingers often to reduce stiffness and swelling.   General instructions  Take over-the-counter and prescription medicines only as told by your doctor.  Rest your wrist from any activity that may cause pain. If needed, talk with your boss at work about changes that can help your wrist heal.  Do exercises as told by your doctor, physical therapist, or occupational therapist.  Keep all follow-up visits. Contact a doctor if:  You have new symptoms.  Medicine does not help your pain.  Your symptoms get worse. Get help right away  if:  You have very bad numbness or tingling in your wrist or hand. Summary  Carpal tunnel syndrome is a condition that causes pain in your hand and arm.  It is often caused by repeated wrist motions.  Lifestyle changes and medicines are used to treat this problem. Surgery may help in very bad cases.  Follow your doctor's instructions about  wearing a splint, resting your wrist, keeping follow-up visits, and calling for help. This information is not intended to replace advice given to you by your health care provider. Make sure you discuss any questions you have with your health care provider. Document Revised: 07/19/2019 Document Reviewed: 07/19/2019 Elsevier Patient Education  2021 [employer].  Elsevier Patient Education  2021 [employer].    [employer]   Brace

## 2020-05-22 NOTE — Progress Notes (Signed)
[facility] Patient [facility] [address] [place], [place]  [address] Phone:  [phone]   Fax:  [phone]   New Patient Office Visit  Subjective:  Patient ID: Sarah Mayo, female    DOB: [birth year]  Age: 30 y.o. MRN: [id number]  CC:  Chief Complaint  Patient presents with  . Establish Care    History of gestational diabetes  . Numbness    Right foot, arm shoulder and hand, burning feeling    HPI Sarah Mayo presents to establish care. 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   Carpal Tunnel Syndrome Patient complains of right wrist and hand pain. This is evaluated as a personal injury. The onset of the pain was sudden, starting about a few months ago The pain is described as burning, numbing and tingling.   The pain occurs continuously and varies .The patient has had night time symptoms.  Restricted activities include: repetitive use pattern. The patient has not had Physical Therapy for these symptoms.  The pain is relieved by avoiding the painful activities EMG studies were not done.  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 She has problems caring for her son. She was seen in the ER on 05/14/20. CT scan of head was negative.  She admits that she has a 30 month old son which she deliver via cesarean birth due to his size of 10 pounds 13 ounces. She gained a large amount of weight during her pregnancy and had gestational diabetes x2.    Past Medical History:  Diagnosis Date  . Alpha thalassemia silent carrier 06/11/2019  . Gestational diabetes    Postpartum 2 hr GTT showed prediabetic state  . 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 History of marijuana use 07/23/2014  . Prediabetes     Past Surgical History:  Procedure Laterality Date  . CESAREAN SECTION N/A 11/21/2019   Procedure:  CESAREAN SECTION;  Surgeon: Kathrynn Running, MD;  Location: [facility] LD ORS;  Service: Obstetrics;  Laterality: N/A;    Family History  Problem Relation Age of Onset  . Asthma Brother   . Sickle cell trait Mother   . Diabetes Paternal Grandmother     Social History   Socioeconomic History  . Marital status: Married    Spouse name: [name]   . Number of children: 3  . Years of education: Not on file  . Highest education level: Not on file  Occupational History  . Not on file  Tobacco Use  . Smoking status: Never Smoker  . Smokeless tobacco: Never Used  Vaping Use  . Vaping Use: Never used  Substance and Sexual Activity  . Alcohol use: No    Comment: Social, Last drink August 2018  . Drug use: Yes    Types: Marijuana    Comment: quit with pregnancy  . Sexual activity: Yes    Partners: Male    Birth control/protection: None  Other Topics Concern  . Not on file  Social History Narrative  . Not on file   Social Determinants of Health   Financial Resource Strain: Low Risk   . Difficulty of Paying Living Expenses: Not hard at all  Food Insecurity: No Food Insecurity  . Worried About Programme researcher, broadcasting/film/video in the Last Year: Never true  . Ran Out of Food in the Last Year: Never  true  Transportation Needs: No Transportation Needs  . Lack of Transportation (Medical): No  . Lack of Transportation (Non-Medical): No  Physical Activity: Inactive  . Days of Exercise per Week: 0 days  . Minutes of Exercise per Session: 0 min  Stress: No Stress Concern Present  . Feeling of Stress : Only a little  Social Connections: Moderately Isolated  . Frequency of Communication with Friends and Family: More than three times a week  . Frequency of Social Gatherings with Friends and Family: Once a week  . Attends Religious Services: Never  . Active Member of Clubs or Organizations: No  . Attends Banker Meetings: Never  . Marital Status: Married  Catering manager Violence: Not At  Risk  . Fear of Current or Ex-Partner: No  . Emotionally Abused: No  . Physically Abused: No  . Sexually Abused: No    ROS Review of Systems  Constitutional: Negative.        Water hydration  Air fry   HENT: Negative.   Eyes: Negative.        Glasses  Respiratory: Negative.  Negative for shortness of breath.   Cardiovascular: Negative.  Negative for chest pain.  Gastrointestinal: Negative.   Endocrine: Negative.   Genitourinary: Negative.   Musculoskeletal: Negative.   Skin: Negative.   Allergic/Immunologic: Negative.   Neurological: Negative.   Hematological: Negative.   Psychiatric/Behavioral: Negative.     Objective:   Today's Vitals: BP (!) 122/55   Pulse 82   Ht 5\' 9"  (1.753 m)   Wt 237 lb (107.5 kg)   LMP 05/12/2020   SpO2 98%   Breastfeeding No   BMI 35.00 kg/m   Physical Exam Constitutional:      General: She is not in acute distress.    Appearance: She is not ill-appearing, toxic-appearing or diaphoretic.  HENT:     Head: Normocephalic and atraumatic.     Nose: Nose normal.     Mouth/Throat:     Mouth: Mucous membranes are moist.  Cardiovascular:     Rate and Rhythm: Normal rate and regular rhythm.     Pulses: Normal pulses.     Heart sounds: Normal heart sounds.  Pulmonary:     Effort: Pulmonary effort is normal.     Breath sounds: Normal breath sounds.  Abdominal:     General: Bowel sounds are normal.     Palpations: Abdomen is soft.  Musculoskeletal:        General: Normal range of motion.     Cervical back: Normal range of motion.  Skin:    General: Skin is warm and dry.     Capillary Refill: Capillary refill takes less than 2 seconds.  Neurological:     General: No focal deficit present.     Mental Status: She is alert and oriented to person, place, and time.  Psychiatric:        Mood and Affect: Mood normal.        Behavior: Behavior normal.        Thought Content: Thought content normal.        Judgment: Judgment normal.      Assessment & Plan:   Problem List Items Addressed This Visit   None   Visit Diagnoses    Encounter to establish care    -  Primary Discussed female health maintenance; SBE, annual CBE, PAP test Discussed general safety in vehicle and COVID Discussed regular hydration with water Discussed healthy diet and exercise and  weight management Discussed sexual health  Discussed mental health Encouraged to call our office for an appointment with in ongoing concerns for questions.    History of gestational diabetes    Consider home glucose monitoring Weight loss at least 5% of current body weight is can be achieved with lifestyle modification dietary changes and regular daily exercise Encourage blood pressure control goal <120/80 and maintaining total cholesterol <200 Follow-up every 3 to 6 months for reevaluation Education material provided    Relevant Orders   POCT glycosylated hemoglobin (Hb A1C) (Completed)   Microalbumin, urine   Carpal tunnel syndrome of right wrist     Discussed at length; pt has 3 risk factors; occupational , recent birth and increased weight gain Encouraged brace, salonpas patches, [employer], weight management   Also would consider trial of steroid if above if not effective. Use with caution due to her history Encourage to keep neurology apt as scheduled may benefit from NCS to verify   Personal history of COVID-19     Encouraged COVID-19 vaccination patient, family and friends  Discussed precautionary measures due to the risk of reinfection; wearing proper fitting mask, avoiding handling the mask especially the outside without proper hand hygiene each time, washing hands often when coming in contact with known and unknown surfaces, using sanitizer when handwashing stations are not available, social distancing, and encouraging family members to do the same.  Most importantly when you are sick and in close contact with others, all are considered exposed and should  remain in quarantine for 5 to 14 days days. It is a case by case bases for quarantining.   If you have questions please contact our office via phone or MyChart.         Outpatient Encounter Medications as of 05/22/2020  Medication Sig  . [DISCONTINUED] acetaminophen (TYLENOL) 500 MG tablet Take 2 tablets (1,000 mg total) by mouth every 6 (six) hours. (Patient not taking: Reported on 05/14/2020)  . [DISCONTINUED] drospirenone-ethinyl estradiol (YASMIN 28) 3-0.03 MG tablet Take 1 tablet by mouth daily. (Patient not taking: Reported on 02/12/2019)  . [DISCONTINUED] fluticasone (FLONASE) 50 MCG/ACT nasal spray Place 2 sprays into both nostrils daily. (Patient not taking: Reported on 02/12/2019)  . [DISCONTINUED] ibuprofen (ADVIL) 600 MG tablet Take 1 tablet (600 mg total) by mouth every 6 (six) hours as needed for fever or mild pain. (Patient not taking: No sig reported)  . [DISCONTINUED] polyethylene glycol (MIRALAX) 17 g packet Take 17 g by mouth daily. (Patient not taking: No sig reported)  . [DISCONTINUED] Prenat-FeCbn-FeAsp-Meth-FA-DHA (PRENATE MINI) 18-0.6-0.4-350 MG CAPS Take 1 tablet by mouth daily. (Patient not taking: No sig reported)   No facility-administered encounter medications on file as of 05/22/2020.    Follow-up: Return in about 3 months (around 08/22/2020) for follow up [id number].   Barbette Merino, NP

## 2020-06-06 ENCOUNTER — Ambulatory Visit (INDEPENDENT_AMBULATORY_CARE_PROVIDER_SITE_OTHER): Payer: Medicaid Other | Admitting: Nurse Practitioner

## 2020-06-06 ENCOUNTER — Other Ambulatory Visit: Payer: Self-pay

## 2020-06-06 ENCOUNTER — Other Ambulatory Visit: Payer: Self-pay | Admitting: Nurse Practitioner

## 2020-06-06 ENCOUNTER — Encounter: Payer: Self-pay | Admitting: Nurse Practitioner

## 2020-06-06 VITALS — BP 124/68 | HR 73 | Temp 97.7°F | Ht 69.0 in | Wt 237.0 lb

## 2020-06-06 DIAGNOSIS — Z411 Encounter for cosmetic surgery: Secondary | ICD-10-CM | POA: Diagnosis not present

## 2020-06-06 DIAGNOSIS — Z01818 Encounter for other preprocedural examination: Secondary | ICD-10-CM

## 2020-06-06 NOTE — Progress Notes (Signed)
[facility] Patient [facility] [address] [place], [place]  [address] Phone:  [phone]   Fax:  [phone]   Established Patient Office Visit  Subjective:  Patient ID: Sarah Mayo, female    DOB: Sep 15, 1990  Age: 30 y.o. MRN: [id number]  CC:  Chief Complaint  Patient presents with  . Follow-up    Have a form fill out     HPI Sarah Mayo presents for exam. 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   She is wanting to have liposuction. She is planning to have surgery in June.   Past Medical History:  Diagnosis Date  . Alpha thalassemia silent carrier 06/11/2019  . Gestational diabetes    Postpartum 2 hr GTT showed prediabetic state  . 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 History of marijuana use 07/23/2014  . Prediabetes     Past Surgical History:  Procedure Laterality Date  . CESAREAN SECTION N/A 11/21/2019   Procedure: CESAREAN SECTION;  Surgeon: Kathrynn Running, MD;  Location: [facility] LD ORS;  Service: Obstetrics;  Laterality: N/A;    Family History  Problem Relation Age of Onset  . Asthma Brother   . Sickle cell trait Mother   . Diabetes Paternal Grandmother     Social History   Socioeconomic History  . Marital status: Married    Spouse name: [name]   . Number of children: 3  . Years of education: Not on file  . Highest education level: Not on file  Occupational History  . Not on file  Tobacco Use  . Smoking status: Never Smoker  . Smokeless tobacco: Never Used  Vaping Use  . Vaping Use: Never used  Substance and Sexual Activity  . Alcohol use: No    Comment: Social, Last drink August 2018  . Drug use: Yes    Types: Marijuana    Comment: quit with pregnancy  . Sexual activity: Yes    Partners: Male    Birth control/protection: None  Other Topics Concern  . Not on file  Social  History Narrative  . Not on file   Social Determinants of Health   Financial Resource Strain: Low Risk   . Difficulty of Paying Living Expenses: Not hard at all  Food Insecurity: No Food Insecurity  . Worried About Programme researcher, broadcasting/film/video in the Last Year: Never true  . Ran Out of Food in the Last Year: Never true  Transportation Needs: No Transportation Needs  . Lack of Transportation (Medical): No  . Lack of Transportation (Non-Medical): No  Physical Activity: Inactive  . Days of Exercise per Week: 0 days  . Minutes of Exercise per Session: 0 min  Stress: No Stress Concern Present  . Feeling of Stress : Only a little  Social Connections: Moderately Isolated  . Frequency of Communication with Friends and Family: More than three times a week  . Frequency of Social Gatherings with Friends and Family: Once a week  . Attends Religious Services: Never  . Active Member of Clubs or Organizations: No  . Attends Banker Meetings: Never  . Marital Status: Married  Catering manager Violence: Not At Risk  . Fear of Current or Ex-Partner: No  . Emotionally Abused: No  . Physically Abused: No  . Sexually Abused: No    No outpatient medications prior to visit.   No  facility-administered medications prior to visit.    No Known Allergies  ROS Review of Systems    Objective:    Physical Exam Constitutional:      Appearance: She is obese.  HENT:     Head: Normocephalic and atraumatic.  Cardiovascular:     Rate and Rhythm: Normal rate.  Pulmonary:     Effort: Pulmonary effort is normal.  Skin:    General: Skin is warm.     Capillary Refill: Capillary refill takes less than 2 seconds.  Neurological:     General: No focal deficit present.     Mental Status: She is alert and oriented to person, place, and time.  Psychiatric:        Mood and Affect: Mood normal.        Behavior: Behavior normal.        Thought Content: Thought content normal.        Judgment:  Judgment normal.     BP 124/68 (BP Location: Left Arm, Patient Position: Sitting, Cuff Size: Normal)   Pulse 73   Temp 97.7 F (36.5 C) (Temporal)   Ht 5\' 9"  (1.753 m)   Wt 237 lb (107.5 kg)   LMP 05/12/2020   BMI 35.00 kg/m  Wt Readings from Last 3 Encounters:  06/06/20 237 lb (107.5 kg)  05/22/20 237 lb (107.5 kg)  01/01/20 243 lb (110.2 kg)     Health Maintenance Due  Topic Date Due  . URINE MICROALBUMIN  Never done    There are no preventive care reminders to display for this patient.  No results found for: TSH Lab Results  Component Value Date   WBC 8.2 05/14/2020   HGB 11.9 (L) 05/14/2020   HCT 35.0 (L) 05/14/2020   MCV 79.8 (L) 05/14/2020   PLT 369 05/14/2020   Lab Results  Component Value Date   NA 141 05/14/2020   K 4.1 05/14/2020   CO2 22 05/14/2020   GLUCOSE 97 05/14/2020   BUN 15 05/14/2020   CREATININE 0.50 05/14/2020   BILITOT 0.6 05/14/2020   ALKPHOS 61 05/14/2020   AST 16 05/14/2020   ALT 29 05/14/2020   PROT 7.2 05/14/2020   ALBUMIN 3.6 05/14/2020   CALCIUM 9.0 05/14/2020   ANIONGAP 10 05/14/2020   No results found for: CHOL No results found for: HDL No results found for: LDLCALC No results found for: TRIG No results found for: CHOLHDL Lab Results  Component Value Date   HGBA1C 4.8 05/22/2020      Assessment & Plan:   Problem List Items Addressed This Visit   None   Visit Diagnoses    Encounter for preoperative examination for general surgical procedure    -  Primary Per labs request   Relevant Orders   CBC with Differential/Platelet   Comp. Metabolic Panel (12)   hCG, quantitative, pregnancy   HIV Antibody (routine testing w rflx)      No orders of the defined types were placed in this encounter.   Follow-up: No follow-ups on file.    07/22/2020, NP

## 2020-06-07 LAB — COMP. METABOLIC PANEL (12)
AST: 15 IU/L (ref 0–40)
Albumin/Globulin Ratio: 1.3 (ref 1.2–2.2)
Albumin: 4.1 g/dL (ref 3.9–5.0)
Alkaline Phosphatase: 71 IU/L (ref 44–121)
BUN/Creatinine Ratio: 18 (ref 9–23)
BUN: 12 mg/dL (ref 6–20)
Bilirubin Total: 0.3 mg/dL (ref 0.0–1.2)
Calcium: 9 mg/dL (ref 8.7–10.2)
Chloride: 102 mmol/L (ref 96–106)
Creatinine, Ser: 0.65 mg/dL (ref 0.57–1.00)
Globulin, Total: 3.1 g/dL (ref 1.5–4.5)
Glucose: 90 mg/dL (ref 65–99)
Potassium: 4.4 mmol/L (ref 3.5–5.2)
Sodium: 136 mmol/L (ref 134–144)
Total Protein: 7.2 g/dL (ref 6.0–8.5)
eGFR: 123 mL/min/{1.73_m2} (ref >59)

## 2020-06-07 LAB — CBC WITH DIFFERENTIAL/PLATELET
Basophils Absolute: 0 10*3/uL (ref 0.0–0.2)
Basos: 0 %
EOS (ABSOLUTE): 0 10*3/uL (ref 0.0–0.4)
Eos: 0 %
Hematocrit: 37.3 % (ref 34.0–46.6)
Hemoglobin: 12.2 g/dL (ref 11.1–15.9)
Immature Grans (Abs): 0 10*3/uL (ref 0.0–0.1)
Immature Granulocytes: 0 %
Lymphocytes Absolute: 2.7 10*3/uL (ref 0.7–3.1)
Lymphs: 30 %
MCH: 26.6 pg (ref 26.6–33.0)
MCHC: 32.7 g/dL (ref 31.5–35.7)
MCV: 81 fL (ref 79–97)
Monocytes Absolute: 0.3 10*3/uL (ref 0.1–0.9)
Monocytes: 3 %
Neutrophils Absolute: 6 10*3/uL (ref 1.4–7.0)
Neutrophils: 67 %
Platelets: 311 10*3/uL (ref 150–450)
RBC: 4.59 x10E6/uL (ref 3.77–5.28)
RDW: 14.5 % (ref 11.7–15.4)
WBC: 9 10*3/uL (ref 3.4–10.8)

## 2020-06-07 LAB — BETA HCG QUANT (REF LAB): hCG Quant: <1 m[IU]/mL

## 2020-06-07 LAB — HIV ANTIBODY (ROUTINE TESTING W REFLEX): HIV Screen 4th Generation wRfx: NONREACTIVE

## 2020-07-15 ENCOUNTER — Telehealth: Payer: Self-pay | Admitting: Neurology

## 2020-07-15 ENCOUNTER — Encounter: Payer: Self-pay | Admitting: Neurology

## 2020-07-15 ENCOUNTER — Ambulatory Visit: Payer: Medicaid Other | Admitting: Neurology

## 2020-07-15 NOTE — Progress Notes (Deleted)
Patient no showed her new patient appointment in neurology today.  If she calls back she can be scheduled with any physician who performs EMG nerve conduction studies.  However please let patient know that we have many patients waiting for neurology appointments and another no-show or cancellation may result in her dismissal from this clinic.

## 2020-07-15 NOTE — Telephone Encounter (Signed)
Patient no showed her new patient appointment in neurology today.  If she calls back she can be scheduled with any physician who performs EMG nerve conduction studies.  However please let patient know that we have many patients waiting for neurology appointments and another no-show or cancellation may result in her dismissal from this clinic(she has 10% no shows in Epic). (Will fyi [employer] so she knows not to send [place] a repeat referral, the referral came for the ED not from [facility]).

## 2020-07-16 NOTE — Telephone Encounter (Signed)
Thank you for the information.  I have made note of this.  Have a great day.

## 2020-08-25 ENCOUNTER — Telehealth: Payer: Medicaid Other | Admitting: Nurse Practitioner

## 2020-08-25 ENCOUNTER — Other Ambulatory Visit: Payer: Self-pay

## 2020-10-22 ENCOUNTER — Ambulatory Visit: Payer: Medicaid Other | Admitting: Nurse Practitioner

## 2020-10-22 ENCOUNTER — Encounter: Payer: Self-pay | Admitting: Nurse Practitioner

## 2020-10-22 ENCOUNTER — Other Ambulatory Visit: Payer: Self-pay

## 2020-10-22 VITALS — BP 124/51 | HR 72 | Temp 97.2°F | Ht 69.0 in | Wt 232.0 lb

## 2020-10-22 DIAGNOSIS — E6609 Other obesity due to excess calories: Secondary | ICD-10-CM

## 2020-10-22 DIAGNOSIS — R7303 Prediabetes: Secondary | ICD-10-CM

## 2020-10-22 DIAGNOSIS — Z6834 Body mass index (BMI) 34.0-34.9, adult: Secondary | ICD-10-CM | POA: Diagnosis not present

## 2020-10-22 NOTE — Patient Instructions (Signed)

## 2020-10-22 NOTE — Progress Notes (Signed)
Sarah Mayo, [place]  [address] Phone:  [phone]   Fax:  [phone]   Established Patient Office Visit  Subjective:  Patient ID: Sarah Mayo, female    DOB: October 30, 1990  Age: 30 y.o. MRN: [id number]  CC:  Chief Complaint  Patient presents with   Follow-up     HPI Sarah Mayo presents for follow up. She  has a past medical history of Alpha thalassemia silent carrier (06/11/2019), Gestational diabetes, Hemoglobin C trait ([place]) (07/23/2014), History of marijuana use (07/23/2014), and Prediabetes.   She reports that she was unable to have her liposuction due to changing jobs. She anticipates having this rescheduled in the future due to not being able to get ride of belly fat.  Past Medical History:  Diagnosis Date   Alpha thalassemia silent carrier 06/11/2019   Gestational diabetes    Postpartum 2 hr GTT showed prediabetic state   Hemoglobin C trait ([place]) 07/23/2014   History of marijuana use 07/23/2014   Prediabetes     Past Surgical History:  Procedure Laterality Date   CESAREAN SECTION N/A 11/21/2019   Procedure: CESAREAN SECTION;  Surgeon: Gwynne Edinger, MD;  Location: [facility] LD ORS;  Service: Obstetrics;  Laterality: N/A;    Family History  Problem Relation Age of Onset   Asthma Brother    Sickle cell trait Mother    Diabetes Paternal Grandmother     Social History   Socioeconomic History   Marital status: Married    Spouse name: [name]    Number of children: 3   Years of education: Not on file   Highest education level: Not on file  Occupational History   Not on file  Tobacco Use   Smoking status: Never   Smokeless tobacco: Never  Vaping Use   Vaping Use: Never used  Substance and Sexual Activity   Alcohol use: No    Comment: Social, Last drink August 2018   Drug use: Yes    Types: Marijuana    Comment: quit with pregnancy   Sexual activity: Yes    Partners: Male    Birth control/protection: None   Other Topics Concern   Not on file  Social History Narrative   Not on file   Social Determinants of Health   Financial Resource Strain: Low Risk    Difficulty of Paying Living Expenses: Not hard at all  Food Insecurity: No Food Insecurity   Worried About Charity fundraiser in the Last Year: Never true   [place] in the Last Year: Never true  Transportation Needs: No Transportation Needs   Lack of Transportation (Medical): No   Lack of Transportation (Non-Medical): No  Physical Activity: Inactive   Days of Exercise per Week: 0 days   Minutes of Exercise per Session: 0 min  Stress: No Stress Concern Present   Feeling of Stress : Only a little  Social Connections: Moderately Isolated   Frequency of Communication with Friends and Family: More than three times a week   Frequency of Social Gatherings with Friends and Family: Once a week   Attends Religious Services: Never   Marine scientist or Organizations: No   Attends Music therapist: Never   Marital Status: Married  Human resources officer Violence: Not At Risk   Fear of Current or Ex-Partner: No   Emotionally Abused: No   Physically Abused: No   Sexually Abused: No  No outpatient medications prior to visit.   No facility-administered medications prior to visit.    No Known Allergies  ROS Review of Systems    Objective:    Physical Exam Constitutional:      Appearance: She is obese.  HENT:     Head: Normocephalic and atraumatic.  Cardiovascular:     Rate and Rhythm: Normal rate and regular rhythm.     Pulses: Normal pulses.     Heart sounds: Normal heart sounds.  Pulmonary:     Effort: Pulmonary effort is normal.     Breath sounds: Normal breath sounds.  Musculoskeletal:        General: Normal range of motion.     Cervical back: Normal range of motion.  Skin:    General: Skin is warm.     Capillary Refill: Capillary refill takes less than 2 seconds.  Neurological:     General:  No focal deficit present.     Mental Status: She is alert and oriented to person, place, and time.  Psychiatric:        Mood and Affect: Mood normal.        Behavior: Behavior normal.        Thought Content: Thought content normal.        Judgment: Judgment normal.    BP (!) 124/51 (BP Location: Right Arm, Patient Position: Sitting)   Pulse 72   Temp (!) 97.2 F (36.2 C)   Ht _0  (1.753 m)   Wt 232 lb 0.6 oz (105.3 kg)   LMP 09/12/2020   SpO2 100%   Breastfeeding No   BMI 34.27 kg/m  Wt Readings from Last 3 Encounters:  10/22/20 232 lb 0.6 oz (105.3 kg)  06/06/20 237 lb (107.5 kg)  05/22/20 237 lb (107.5 kg)     Health Maintenance Due  Topic Date Due   URINE MICROALBUMIN  Never done    There are no preventive care reminders to display for this patient.  No results found for: TSH Lab Results  Component Value Date   WBC 9.0 06/06/2020   HGB 12.2 06/06/2020   HCT 37.3 06/06/2020   MCV 81 06/06/2020   PLT 311 06/06/2020   Lab Results  Component Value Date   NA 136 06/06/2020   K 4.4 06/06/2020   CO2 22 05/14/2020   GLUCOSE 90 06/06/2020   BUN 12 06/06/2020   CREATININE 0.65 06/06/2020   BILITOT 0.3 06/06/2020   ALKPHOS 71 06/06/2020   AST 15 06/06/2020   ALT 29 05/14/2020   PROT 7.2 06/06/2020   ALBUMIN 4.1 06/06/2020   CALCIUM 9.0 06/06/2020   ANIONGAP 10 05/14/2020   EGFR 123 06/06/2020   No results found for: CHOL No results found for: HDL No results found for: LDLCALC No results found for: TRIG No results found for: CHOLHDL Lab Results  Component Value Date   HGBA1C 4.8 05/22/2020      Assessment & Plan:   Problem List Items Addressed This Visit   None Visit Diagnoses     Class 1 obesity due to excess calories without serious comorbidity with body mass index (BMI) of 34.0 to 34.9 in adult    -  Primary Obesity with BMI Discussed proper diet (low fat, low sodium, high fiber) with patient.   Discussed need for regular exercise (3 times  per week, 20 minutes per session) with patient.        No orders of the defined types were placed in this  encounter.   Follow-up: Return in about 1 year (around 10/22/2021).    Vevelyn Francois, NP

## 2021-01-16 IMAGING — US US MFM OB FOLLOW-UP
1 series · 13 of 28 positions shown · non-contrast
Comparison: none

[Series 1: us mfm ob follow-up · 52 acquisitions, 13 frames shown]
[im 2/52]
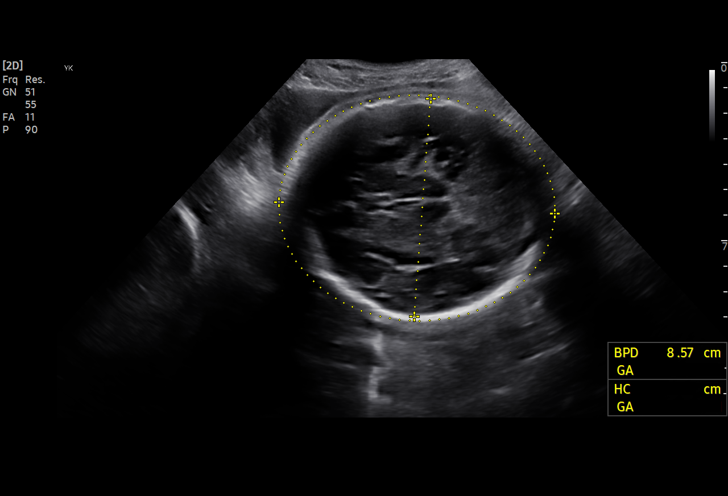
[im 6/52]
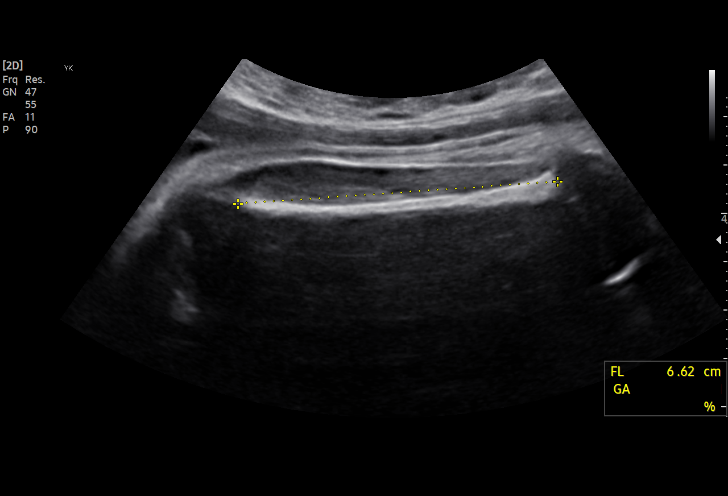
[im 10/52]
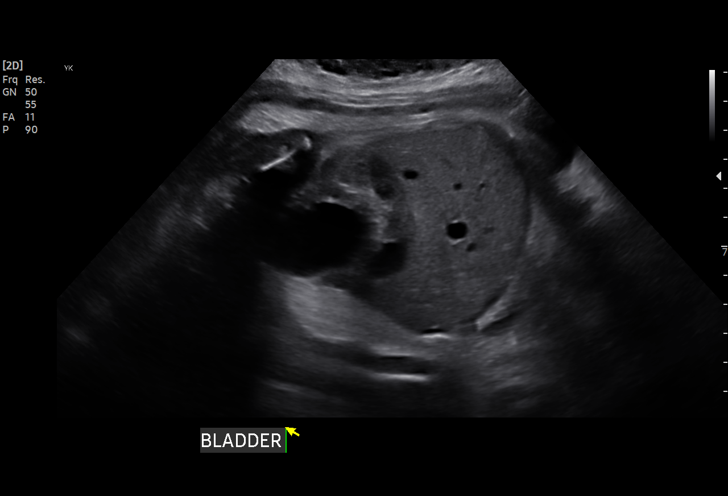
[im 14/52]
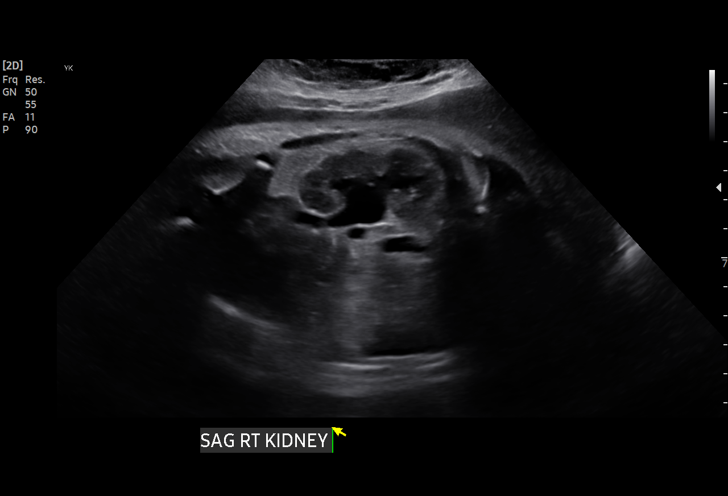
[im 18/52]
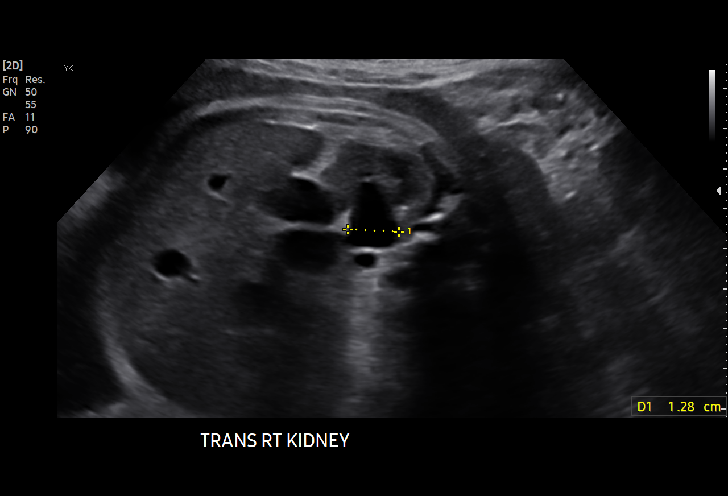
[im 21/52]
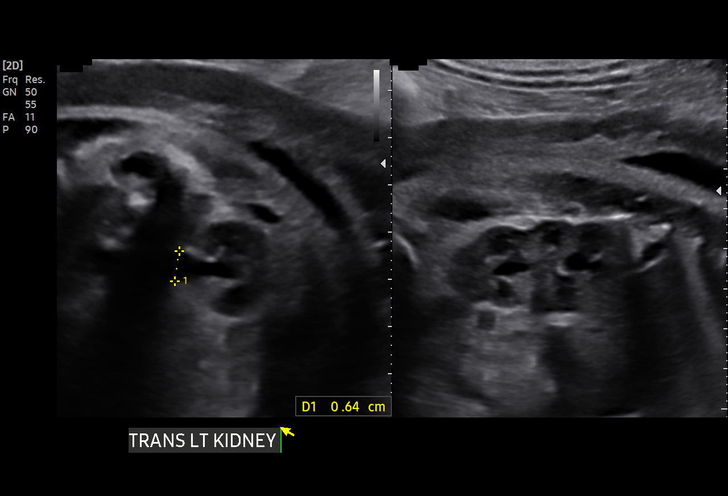
[im 27/52]
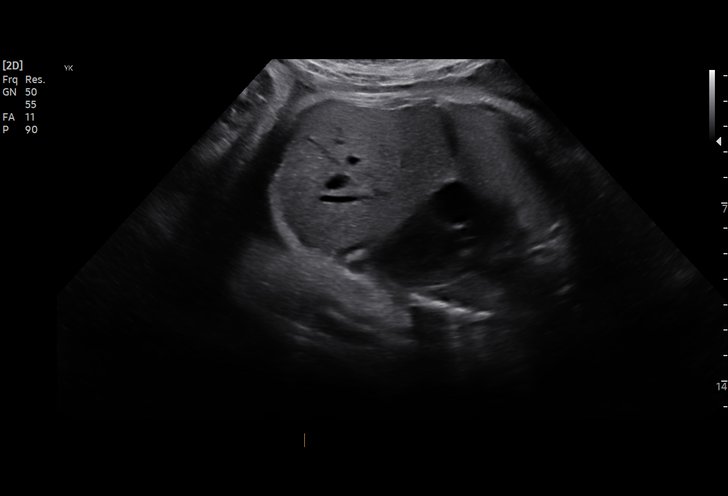
[im 31/52]
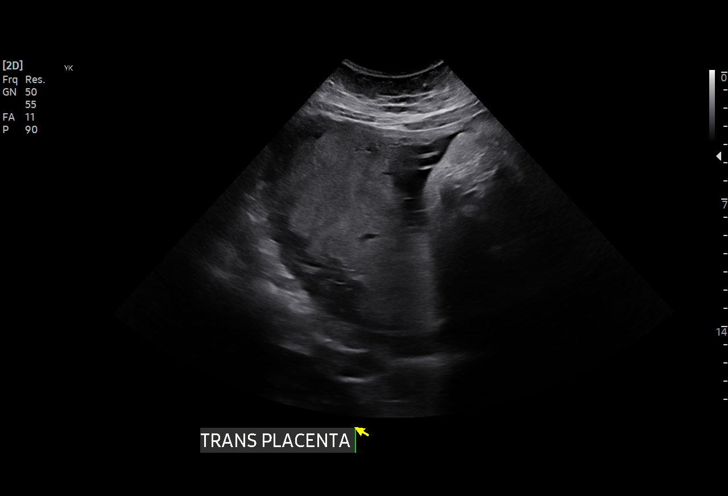
[im 35/52]
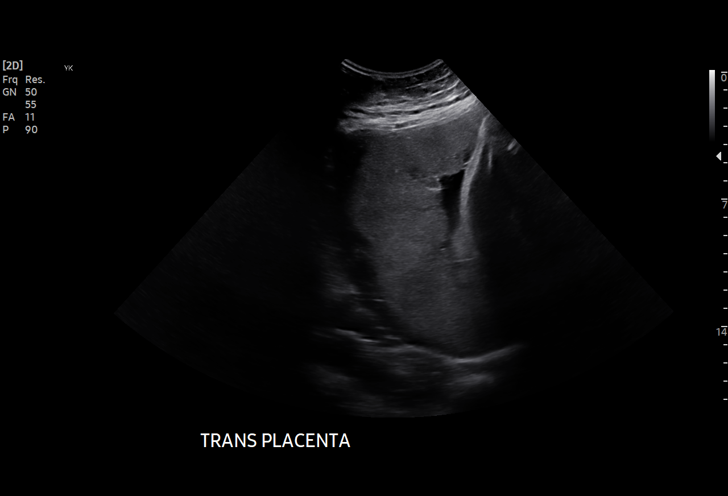
[im 38/52]
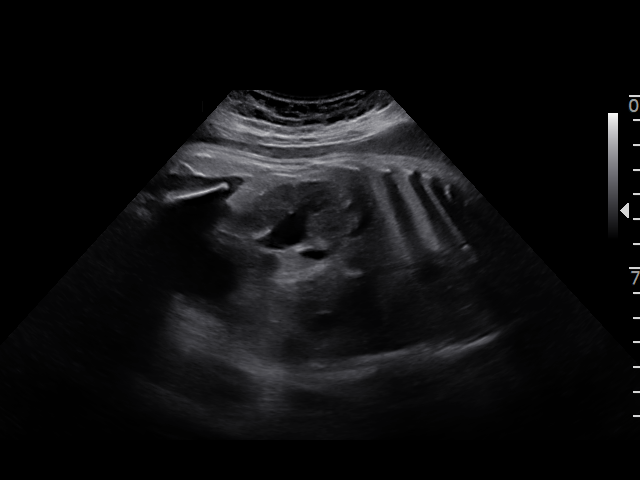
[im 42/52]
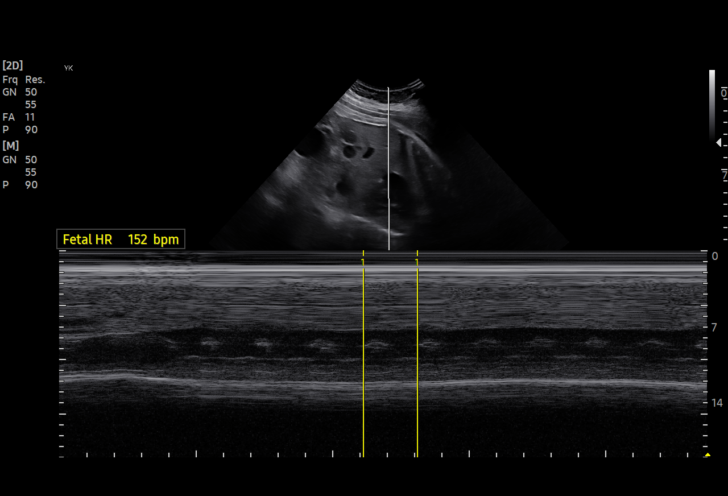
[im 46/52]
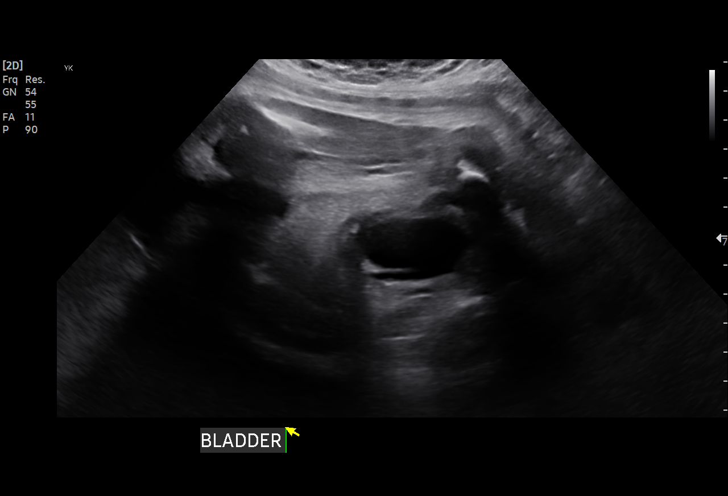
[im 50/52]
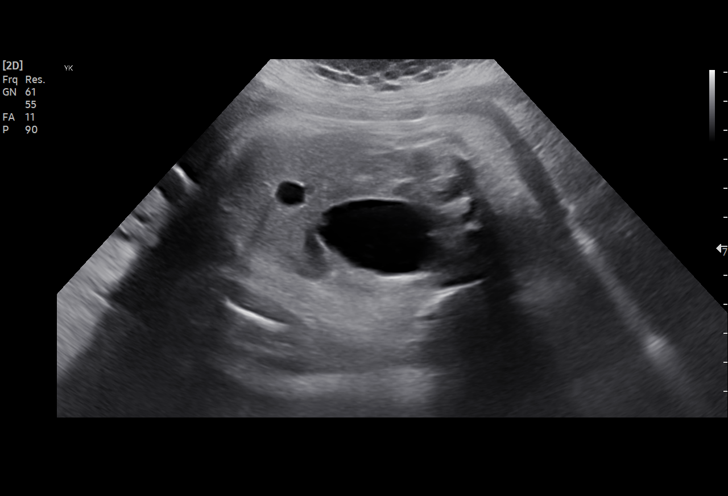

[13 of 28 positions shown; findings below may reference images not displayed]

Indications

 Obesity complicating pregnancy, second
 trimester
 Gestational diabetes in pregnancy,
 controlled by oral hypoglycemic drugs
 (metformin)
 Encounter for other antenatal screening
 follow-up (low risk NIPS, neg AFP)
 History of sickle cell trait
 Genetic carrier (hemoglobin C/silent alpha
 Erxleben)
 Poor obstetric history: Previous gestational
 diabetes
 32 weeks gestation of pregnancy
Fetal Evaluation

 Num Of Fetuses:         1
 Fetal Heart Rate(bpm):  144
 Cardiac Activity:       Observed
 Presentation:           Cephalic
 Placenta:               Right lateral
 P. Cord Insertion:      Previously Visualized
 Amniotic Fluid
 AFI FV:      Within normal limits

 AFI Sum(cm)     %Tile       Largest Pocket(cm)
 12.8            38

 RUQ(cm)       RLQ(cm)       LUQ(cm)        LLQ(cm)
 3
Biophysical Evaluation

 Amniotic F.V:   Within normal limits       F. Tone:        Observed
 F. Movement:    Observed                   Score:          [DATE]
 F. Breathing:   Observed
Biometry

 BPD:      86.9  mm     G. Age:  35w 1d         98  %    CI:        77.27   %    70 - 86
                                                         FL/HC:      21.3   %    19.1 -
 HC:       313   mm     G. Age:  35w 0d         89  %    HC/AC:      0.95        0.96 -
 AC:      330.6  mm     G. Age:  37w 0d       > 99  %    FL/BPD:     76.8   %    71 - 87
 FL:       66.7  mm     G. Age:  34w 2d         91  %    FL/AC:      20.2   %    20 - 24

 Est. FW:    1047  gm      6 lb 3 oz   > 99  %
OB History

 Gravidity:    3         Term:   2        Prem:   0        SAB:   0
 TOP:          0       Ectopic:  0        Living: 2
Gestational Age

 LMP:           32w 5d        Date:  03/01/19                 EDD:   12/06/19
 U/S Today:     35w 3d                                        EDD:   11/17/19
 Best:          32w 0d     Det. By:  Early Ultrasound         EDD:   12/11/19
                                     (04/23/19)
Anatomy

 Cranium:               Appears normal         LVOT:                   Previously seen
 Cavum:                 Appears normal         Aortic Arch:            Previously seen
 Ventricles:            Appears normal         Ductal Arch:            Previously seen
 Choroid Plexus:        Previously seen        Diaphragm:              Appears normal
 Cerebellum:            Previously seen        Stomach:                Appears normal, left
                                                                       sided
 Posterior Fossa:       Previously seen        Abdomen:                Appears normal
 Nuchal Fold:           Previously seen        Abdominal Wall:         Previously seen
 Face:                  Orbits and profile     Cord Vessels:           Previously seen
                        previously seen
 Lips:                  Previously seen        Kidneys:                Bilateral UTD RT
                                                                       13 mm LT 6 mm
 Palate:                Previously seen        Bladder:                Appears normal
 Thoracic:              Appears normal         Spine:                  Previously seen
 Heart:                 Previously seen        Upper Extremities:      Previously seen
 RVOT:                  Previously seen        Lower Extremities:      Previously seen

 Other:  Heels/feet and open hands/5th digits previously visualized. Nasal
         bone visualized.
Impression

 Gestational diabetes.  Patient takes Metformin for control.
 On ultrasound, the estimated fetal weight is at the 99th
 percentile.  Amniotic fluid is normal good fetal activity seen.
 Antenatal testing is reassuring.  BPP [DATE].
 Bilateral urinary tract dilations are seen again.  Right pelvis
 measures 13 mm in the left 6 mm.  Both kidneys appear
 normal with no increased echogenicities.

 I counseled the patient of the findings.
Recommendations

 -Continue weekly BPP till delivery.
                 [place], Klpigbb

## 2021-03-28 ENCOUNTER — Encounter (HOSPITAL_COMMUNITY): Payer: Self-pay | Admitting: Emergency Medicine

## 2021-03-28 ENCOUNTER — Emergency Department (HOSPITAL_COMMUNITY)
Admission: EM | Admit: 2021-03-28 | Discharge: 2021-03-28 | Disposition: A | Discharge status: Home / Self Care | Payer: Medicaid Other | Attending: Emergency Medicine | Admitting: Emergency Medicine

## 2021-03-28 DIAGNOSIS — R1084 Generalized abdominal pain: Secondary | ICD-10-CM | POA: Diagnosis not present

## 2021-03-28 DIAGNOSIS — R1030 Lower abdominal pain, unspecified: Secondary | ICD-10-CM | POA: Diagnosis present

## 2021-03-28 LAB — CBC WITH DIFFERENTIAL/PLATELET
Abs Immature Granulocytes: 0.03 10*3/uL (ref 0.00–0.07)
Basophils Absolute: 0.1 10*3/uL (ref 0.0–0.1)
Basophils Relative: 0 %
Eosinophils Absolute: 0.1 10*3/uL (ref 0.0–0.5)
Eosinophils Relative: 1 %
HCT: 36 % (ref 36.0–46.0)
Hemoglobin: 12.4 g/dL (ref 12.0–15.0)
Immature Granulocytes: 0 %
Lymphocytes Relative: 27 %
Lymphs Abs: 3.1 10*3/uL (ref 0.7–4.0)
MCH: 27.5 pg (ref 26.0–34.0)
MCHC: 34.4 g/dL (ref 30.0–36.0)
MCV: 79.8 fL — ABNORMAL LOW (ref 80.0–100.0)
Monocytes Absolute: 0.6 10*3/uL (ref 0.1–1.0)
Monocytes Relative: 5 %
Neutro Abs: 7.6 10*3/uL (ref 1.7–7.7)
Neutrophils Relative %: 67 %
Platelets: 310 10*3/uL (ref 150–400)
RBC: 4.51 MIL/uL (ref 3.87–5.11)
RDW: 13.5 % (ref 11.5–15.5)
WBC: 11.4 10*3/uL — ABNORMAL HIGH (ref 4.0–10.5)
nRBC: 0 % (ref 0.0–0.2)

## 2021-03-28 LAB — URINALYSIS, ROUTINE W REFLEX MICROSCOPIC
Bilirubin Urine: NEGATIVE
Glucose, UA: NEGATIVE mg/dL
Ketones, ur: NEGATIVE mg/dL
Leukocytes,Ua: NEGATIVE
Nitrite: NEGATIVE
Protein, ur: NEGATIVE mg/dL
Specific Gravity, Urine: 1.025 (ref 1.005–1.030)
pH: 6.5 (ref 5.0–8.0)

## 2021-03-28 LAB — URINALYSIS, MICROSCOPIC (REFLEX)

## 2021-03-28 LAB — I-STAT BETA HCG BLOOD, ED (MC, WL, AP ONLY): I-stat hCG, quantitative: <5 m[IU]/mL (ref <5)

## 2021-03-28 MED ORDER — KETOROLAC TROMETHAMINE 30 MG/ML IJ SOLN
30.0000 mg | Freq: Once | INTRAMUSCULAR | Status: AC
  Administered 2021-03-28: 30 mg via INTRAVENOUS
  Filled 2021-03-28: qty 1

## 2021-03-28 MED ORDER — ONDANSETRON HCL 4 MG/2ML IJ SOLN
4.0000 mg | Freq: Once | INTRAMUSCULAR | Status: AC
  Administered 2021-03-28: 4 mg via INTRAVENOUS
  Filled 2021-03-28: qty 2

## 2021-03-28 MED ORDER — NAPROXEN 500 MG PO TABS: 500.0000 mg | ORAL_TABLET | Freq: Two times a day (BID) | ORAL | 0 refills | Status: DC

## 2021-03-28 MED ORDER — SODIUM CHLORIDE 0.9 % IV BOLUS
1000.0000 mL | Freq: Once | INTRAVENOUS | Status: AC
  Administered 2021-03-28: 1000 mL via INTRAVENOUS

## 2021-03-28 MED ORDER — SODIUM CHLORIDE 0.9 % IV BOLUS
500.0000 mL | Freq: Once | INTRAVENOUS | Status: AC
  Administered 2021-03-28: 500 mL via INTRAVENOUS

## 2021-03-28 NOTE — ED Provider Triage Note (Signed)
Emergency Medicine Provider Triage Evaluation Note  Sarah Mayo , a 31 y.o. female  was evaluated in triage.  Pt complains of menstrual cramping.  Family history of endometriosis concerned that she has the same.  States that her menstrual cycle is heavier, however states that it is always been this way since menarche.  She states she is having significant cramping, however she also states that this is her baseline  Review of Systems  Positive:  Negative: See above  Physical Exam  BP 125/78    Pulse 67    Temp 98.7 F (37.1 C)    Resp 20    SpO2 100%  Gen:   Awake, no distress   Resp:  Normal effort  MSK:   Moves extremities without difficulty  Other:    Medical Decision Making  Medically screening exam initiated at 12:21 PM.  Appropriate orders placed.  GIAMARIE BUECHE was informed that the remainder of the evaluation will be completed by another provider, this initial triage assessment does not replace that evaluation, and the importance of remaining in the ED until their evaluation is complete.     Silva Bandy, PA-C 03/28/21 1222

## 2021-03-28 NOTE — ED Triage Notes (Signed)
Pt states she is on her menstrual cramp since yesterday. Endorses pain to buttocks,uterus. Family hx of endometriosis. No relief with motrin, Midol, tylenol.

## 2021-03-28 NOTE — ED Provider Notes (Signed)
[facility] EMERGENCY DEPARTMENT Provider Note   CSN: [id number] Arrival date & time: 03/28/21  1137     History  No chief complaint on file.   Sarah Mayo is a 31 y.o. female.  HPI G3 P3 with self-described irregular menstrual cycle presents with lower abdominal crampiness.  Last menstrual cycle was about 1 month ago.  She notes it is not unusual for have severe pain, today she presents with this, diffuse pain throughout the lower abdomen, no fever, no vomiting, no chest pain, no dyspnea, no relief with Tylenol, ibuprofen. She has family history of endometriosis, including a sister has had hysterectomy due to this.  She has not spoken with her gynecologist about her pain.    Home Medications Prior to Admission medications   Medication Sig Start Date End Date Taking? Authorizing Provider  drospirenone-ethinyl estradiol (YASMIN 28) 3-0.03 MG tablet Take 1 tablet by mouth daily. Patient not taking: Reported on 02/12/2019 06/06/17 04/07/19  Adam Phenix, MD  fluticasone [facility]) 50 MCG/ACT nasal spray Place 2 sprays into both nostrils daily. Patient not taking: No sig reported 06/13/18 04/07/19  Rennis Harding, PA-C      Allergies    Patient has no known allergies.    Review of Systems   Review of Systems  Constitutional:        Per HPI, otherwise negative  HENT:         Per HPI, otherwise negative  Respiratory:         Per HPI, otherwise negative  Cardiovascular:        Per HPI, otherwise negative  Gastrointestinal:  Negative for vomiting.  Endocrine:       Negative aside from HPI  Genitourinary:        Neg aside from HPI   Musculoskeletal:        Per HPI, otherwise negative  Skin: Negative.   Neurological:  Negative for syncope.   Physical Exam Updated Vital Signs BP 125/78    Pulse 67    Temp 98.7 F (37.1 C)    Resp 20    SpO2 100%  Physical Exam Vitals and nursing note reviewed.  Constitutional:      General: She is not in acute  distress.    Appearance: She is well-developed.  HENT:     Head: Normocephalic and atraumatic.  Eyes:     Conjunctiva/sclera: Conjunctivae normal.  Cardiovascular:     Rate and Rhythm: Normal rate and regular rhythm.  Pulmonary:     Effort: Pulmonary effort is normal. No respiratory distress.     Breath sounds: Normal breath sounds. No stridor.  Abdominal:     General: There is no distension.     Tenderness: There is no abdominal tenderness. There is no guarding.  Skin:    General: Skin is warm and dry.  Neurological:     Mental Status: She is alert and oriented to person, place, and time.     Cranial Nerves: No cranial nerve deficit.    ED Results / Procedures / Treatments   Labs (all labs ordered are listed, but only abnormal results are displayed) Labs Reviewed  CBC WITH DIFFERENTIAL/PLATELET  URINALYSIS, ROUTINE W REFLEX MICROSCOPIC  I-STAT BETA HCG BLOOD, ED ([facility], [facility], [facility] ONLY)    EKG None  Radiology No results found.  Procedures Procedures    Medications Ordered in ED Medications  sodium chloride 0.9 % bolus 500 mL (has no administration in time range)  ketorolac (TORADOL) 30  MG/ML injection 30 mg (has no administration in time range)  ondansetron (ZOFRAN) injection 4 mg (has no administration in time range)    ED Course/ Medical Decision Making/ A&P                           Medical Decision Making  On repeat exam patient is in no distress.  I reviewed her labs independently, negative pregnancy negative for anemia, urinalysis reassuring.  Patient has received fluids, Toradol, has been monitored for hours and no decompensation.  Broad differential including endometritis versus lower urinary tract disease versus menstrual cramps versus pregnancy all considered, reassuring results as above, improvement clinically here, no indication for admission nor stat OB consultation.    On signout the patient is awaiting urinalysis results, but is likely appropriate for  discharge with outpatient follow-up regardless..        Final Clinical Impression(s) / ED Diagnoses Final diagnoses:  Generalized abdominal pain     Gerhard Munch, MD 03/28/21 2141

## 2021-03-28 NOTE — ED Provider Notes (Signed)
°  Physical Exam  BP (!) 122/98    Pulse 64    Temp 98.7 F (37.1 C)    Resp 20    SpO2 100%   Physical Exam  Procedures  Procedures  ED Course / MDM    Medical Decision Making Care assumed at 9 pm. Patient here with lower abdominal cramps and this happens with her menses. Thought to have menstrual cramps. Sign out pending UA   10:56 PM UA unremarkable. Labs reviewed and unchanged from baseline. Has GYN follow up. Told her to continue tylenol, motrin as needed   Amount and/or Complexity of Data Reviewed External Data Reviewed: labs. Labs: ordered. Decision-making details documented in ED Course.          Charlynne Pander, MD 03/28/21 [phone]

## 2021-03-28 NOTE — Discharge Instructions (Addendum)
You likely have bad menstrual cramps   You can try naprosyn for pain   See your GYN doctor or PCP. You may benefit from birth control to regulate your cycle   Return to ER if you have worse abdominal pain, vomiting

## 2021-03-30 ENCOUNTER — Telehealth: Payer: Self-pay

## 2021-03-30 NOTE — Telephone Encounter (Signed)
Transition Care Management Unsuccessful Follow-up Telephone Call  Date of discharge and from where:  03/28/2021 from [facility]  Attempts:  1st Attempt  Reason for unsuccessful TCM follow-up call:  Left voice message

## 2021-03-31 NOTE — Telephone Encounter (Signed)
Transition Care Management Unsuccessful Follow-up Telephone Call  Date of discharge and from where:  03/28/21 from [facility]  Attempts:  2nd Attempt  Reason for unsuccessful TCM follow-up call:  Left voice message

## 2021-04-01 ENCOUNTER — Encounter: Payer: Self-pay | Admitting: Nurse Practitioner

## 2021-04-01 ENCOUNTER — Ambulatory Visit: Payer: Medicaid Other | Admitting: Nurse Practitioner

## 2021-04-01 ENCOUNTER — Other Ambulatory Visit: Payer: Self-pay

## 2021-04-01 VITALS — BP 123/67 | HR 75 | Temp 98.1°F | Ht 69.0 in | Wt 225.2 lb

## 2021-04-01 DIAGNOSIS — R102 Pelvic and perineal pain: Secondary | ICD-10-CM | POA: Diagnosis not present

## 2021-04-01 DIAGNOSIS — E6609 Other obesity due to excess calories: Secondary | ICD-10-CM | POA: Diagnosis not present

## 2021-04-01 DIAGNOSIS — R7303 Prediabetes: Secondary | ICD-10-CM

## 2021-04-01 DIAGNOSIS — Z6834 Body mass index (BMI) 34.0-34.9, adult: Secondary | ICD-10-CM

## 2021-04-01 NOTE — Patient Instructions (Addendum)
Preventive Care 21-29 Years Old, Female °Preventive care refers to lifestyle choices and visits with your health care provider that can promote health and wellness. Preventive care visits are also called wellness exams. °What can I expect for my preventive care visit? °Counseling °During your preventive care visit, your health care provider may ask about your: °Medical history, including: °Past medical problems. °Family medical history. °Pregnancy history. °Current health, including: °Menstrual cycle. °Method of birth control. °Emotional well-being. °Home life and relationship well-being. °Sexual activity and sexual health. °Lifestyle, including: °Alcohol, nicotine or tobacco, and drug use. °Access to firearms. °Diet, exercise, and sleep habits. °Work and work environment. °Sunscreen use. °Safety issues such as seatbelt and bike helmet use. °Physical exam °Your health care provider may check your: °Height and weight. These may be used to calculate your BMI (body mass index). BMI is a measurement that tells if you are at a healthy weight. °Waist circumference. This measures the distance around your waistline. This measurement also tells if you are at a healthy weight and may help predict your risk of certain diseases, such as type 2 diabetes and high blood pressure. °Heart rate and blood pressure. °Body temperature. °Skin for abnormal spots. °What immunizations do I need? °Vaccines are usually given at various ages, according to a schedule. Your health care provider will recommend vaccines for you based on your age, medical history, and lifestyle or other factors, such as travel or where you work. °What tests do I need? °Screening °Your health care provider may recommend screening tests for certain conditions. This may include: °Pelvic exam and Pap test. °Lipid and cholesterol levels. °Diabetes screening. This is done by checking your blood sugar (glucose) after you have not eaten for a while (fasting). °Hepatitis B  test. °Hepatitis C test. °HIV (human immunodeficiency virus) test. °STI (sexually transmitted infection) testing, if you are at risk. °BRCA-related cancer screening. This may be done if you have a family history of breast, ovarian, tubal, or peritoneal cancers. °Talk with your health care provider about your test results, treatment options, and if necessary, the need for more tests. °Follow these instructions at home: °Eating and drinking ° °Eat a healthy diet that includes fresh fruits and vegetables, whole grains, lean protein, and low-fat dairy products. °Take vitamin and mineral supplements as recommended by your health care provider. °Do not drink alcohol if: °Your health care provider tells you not to drink. °You are pregnant, may be pregnant, or are planning to become pregnant. °If you drink alcohol: °Limit how much you have to 0-1 drink a day. °Know how much alcohol is in your drink. In the [place] one drink equals one 12 oz bottle of beer (355 mL), one 5 oz glass of wine (148 mL), or one 1½ oz glass of hard liquor (44 mL). °Lifestyle °Brush your teeth every morning and night with fluoride toothpaste. Floss one time each day. °Exercise for at least 30 minutes 5 or more days each week. °Do not use any products that contain nicotine or tobacco. These products include cigarettes, chewing tobacco, and vaping devices, such as e-cigarettes. If you need help quitting, ask your health care provider. °Do not use drugs. °If you are sexually active, practice safe sex. Use a condom or other form of protection to prevent STIs. °If you do not wish to become pregnant, use a form of birth control. If you plan to become pregnant, see your health care provider for a prepregnancy visit. °Find healthy ways to manage stress, such as: °Meditation, yoga,   or listening to music. Journaling. Talking to a trusted person. Spending time with friends and family. Minimize exposure to UV radiation to reduce your risk of skin  cancer. Safety Always wear your seat belt while driving or riding in a vehicle. Do not drive: If you have been drinking alcohol. Do not ride with someone who has been drinking. If you have been using any mind-altering substances or drugs. While texting. When you are tired or distracted. Wear a helmet and other protective equipment during sports activities. If you have firearms in your house, make sure you follow all gun safety procedures. Seek help if you have been physically or sexually abused. What's next? Go to your health care provider once a year for an annual wellness visit. Ask your health care provider how often you should have your eyes and teeth checked. Stay up to date on all vaccines. This information is not intended to replace advice given to you by your health care provider. Make sure you discuss any questions you have with your health care provider. Document Revised: 09/03/2020 Document Reviewed: 09/03/2020 Elsevier Patient Education  [place]. Pelvic Pain, Female Pelvic pain is pain in your lower belly (abdomen), below your belly button and between your hips. The pain may: Start all of a sudden (be acute). Keep coming back (be recurring). Last a long time (become chronic). Pelvic pain that lasts longer than 6 months is called chronic pelvic pain. There are many causes of pelvic pain. Sometimes the cause of pelvic pain is not known. Follow these instructions at home:  Take over-the-counter and prescription medicines only as told by your doctor. Rest as told by your doctor. Do not have sex if it hurts. Keep a journal of your pelvic pain. Write down: When the pain started. Where the pain is located. What seems to make the pain better or worse, such as food or your monthly period (menstrual cycle). Any symptoms you have along with the pain. Keep all follow-up visits. Contact a doctor if: Medicine does not help your pain, or your pain comes back. You have new  symptoms. You have unusual discharge or bleeding from your vagina. You have a fever or chills. You are having trouble pooping (constipation). You have blood in your pee (urine) or poop (stool). Your pee smells bad. You feel weak or light-headed. Get help right away if: You have sudden pain that is very bad. You have very bad pain and also have any of these symptoms: A fever. Feeling like you may vomit (nauseous). Vomiting. Being very sweaty. You faint. These symptoms may be an emergency. Get help right away. Call your local emergency services (911 in the [place] Do not wait to see if the symptoms will go away. Do not drive yourself to the hospital. Summary Pelvic pain is pain in your lower belly (abdomen), below your belly button and between your hips. There are many causes of pelvic pain. Keep a journal of your pelvic pain. This information is not intended to replace advice given to you by your health care provider. Make sure you discuss any questions you have with your health care provider. Document Revised: 07/15/2020 Document Reviewed: 07/15/2020 Elsevier Patient Education  [place].

## 2021-04-01 NOTE — Progress Notes (Signed)
° °[facility] °[address] °[place], [place]  [address] °Phone:  [phone]   Fax:  [phone] ° ° °Established Patient Office Visit ° °Subjective:  °Patient ID: Sarah Mayo, female    DOB: 09/20/1990  Age: 30 y.o. MRN: [id number] ° °CC:  °Chief Complaint  °Patient presents with  ° Pelvic Pain  °  Pt states that she has been having very heavy periods associated with pains in her buttocks, lower back and lower pelvic area. Pt states that she went to the hospital for the pains but nothing was done and all they told her is that she was having bad menstrual pains and prescribed her some naproxen.  ° °Pt does have an ob/gyn and is going to call and schedule an appt.  ° ° °HPI °Sarah Mayo presents for pelvic pain. 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  ° °She is in today for Pelvic pain.  She was seen at the ED however no real treatment was completed.  She is concerned that she may have endometriosis or fibroids due to family history. °Denies vaginal discharge or dysuria.  Denies ulcers or lesions ° °Past Medical History:  °Diagnosis Date  ° Alpha thalassemia silent carrier 06/11/2019  ° Gestational diabetes   ° Postpartum 2 hr GTT showed prediabetic state  ° Hemoglobin C trait (HCC) 07/23/2014  ° History of marijuana use 07/23/2014  ° Prediabetes   ° ° °Past Surgical History:  °Procedure Laterality Date  ° CESAREAN SECTION N/A 11/21/2019  ° Procedure: CESAREAN SECTION;  Surgeon: Wouk, Noah Bedford, MD;  Location: [facility] LD ORS;  Service: Obstetrics;  Laterality: N/A;  ° ° °Family History  °Problem Relation Age of Onset  ° Asthma Brother   ° Sickle cell trait Mother   ° Diabetes Paternal Grandmother   ° ° °Social History  ° °Socioeconomic History  ° Marital status: Married  °  Spouse name: [name]   ° Number of children: 3  ° Years of  education: Not on file  ° Highest education level: Not on file  °Occupational History  ° Not on file  °Tobacco Use  ° Smoking status: Never  ° Smokeless tobacco: Never  °Vaping Use  ° Vaping Use: Never used  °Substance and Sexual Activity  ° Alcohol use: No  °  Comment: Social, Last drink August 2018  ° Drug use: Not Currently  °  Types: Marijuana  °  Comment: quit with pregnancy  ° Sexual activity: Yes  °  Partners: Male  °  Birth control/protection: None  °Other Topics Concern  ° Not on file  °Social History Narrative  ° Not on file  ° °Social Determinants of Health  ° °Financial Resource Strain: Low Risk   ° Difficulty of Paying Living Expenses: Not hard at all  °Food Insecurity: No Food Insecurity  ° Worried About Running Out of Food in the Last Year: Never true  ° Ran Out of Food in the Last Year: Never true  °Transportation Needs: No Transportation Needs  ° Lack of Transportation (Medical): No  ° Lack of Transportation (Non-Medical): No  °Physical Activity: Inactive  ° Days of Exercise per Week: 0 days  ° Minutes of Exercise per Session: 0 min  °Stress: No Stress Concern Present  ° Feeling of Stress : Only a little  °Social Connections: Moderately Isolated  °   Frequency of Communication with Friends and Family: More than three times a week  ° Frequency of Social Gatherings with Friends and Family: Once a week  ° Attends Religious Services: Never  ° Active Member of Clubs or Organizations: No  ° Attends Club or Organization Meetings: Never  ° Marital Status: Married  °Intimate Partner Violence: Not At Risk  ° Fear of Current or Ex-Partner: No  ° Emotionally Abused: No  ° Physically Abused: No  ° Sexually Abused: No  ° ° °Outpatient Medications Prior to Visit  °Medication Sig Dispense Refill  ° acetaminophen (TYLENOL) 500 MG tablet Take 500 mg by mouth every 6 (six) hours as needed for moderate pain.    ° ibuprofen (ADVIL) 200 MG tablet Take 200 mg by mouth every 6 (six) hours as needed for moderate pain.    °  naproxen (NAPROSYN) 500 MG tablet Take 1 tablet (500 mg total) by mouth 2 (two) times daily. 30 tablet 0  ° °No facility-administered medications prior to visit.  ° ° °No Known Allergies ° °ROS °Review of Systems ° °  °Objective:  °  °Physical Exam °Constitutional:   °   Appearance: She is obese.  °HENT:  °   Head: Normocephalic and atraumatic.  °Cardiovascular:  °   Rate and Rhythm: Normal rate and regular rhythm.  °   Pulses: Normal pulses.  °   Heart sounds: Normal heart sounds.  °Pulmonary:  °   Effort: Pulmonary effort is normal.  °   Breath sounds: Normal breath sounds.  °Musculoskeletal:     °   General: Normal range of motion.  °   Cervical back: Normal range of motion.  °Skin: °   General: Skin is warm.  °   Capillary Refill: Capillary refill takes less than 2 seconds.  °Neurological:  °   General: No focal deficit present.  °   Mental Status: She is alert and oriented to person, place, and time.  °Psychiatric:     °   Mood and Affect: Mood normal.     °   Behavior: Behavior normal.     °   Thought Content: Thought content normal.     °   Judgment: Judgment normal.  ° ° °BP 123/67    Pulse 75    Temp 98.1 °F (36.7 °C)    Ht 5' 9" (1.753 m)    Wt 225 lb 3.2 oz (102.2 kg)    LMP 03/27/2021 (Exact Date)    SpO2 100%    BMI 33.26 kg/m²  °Wt Readings from Last 3 Encounters:  °04/01/21 225 lb 3.2 oz (102.2 kg)  °10/22/20 232 lb 0.6 oz (105.3 kg)  °06/06/20 237 lb (107.5 kg)  ° ° ° °Health Maintenance Due  °Topic Date Due  ° COVID-19 Vaccine (3 - Booster for Pfizer series) 04/26/2020  ° INFLUENZA VACCINE  10/20/2020  ° ° °There are no preventive care reminders to display for this patient. ° °No results found for: TSH °Lab Results  °Component Value Date  ° WBC 11.4 (H) 03/28/2021  ° HGB 12.4 03/28/2021  ° HCT 36.0 03/28/2021  ° MCV 79.8 (L) 03/28/2021  ° PLT 310 03/28/2021  ° °Lab Results  °Component Value Date  ° NA 136 06/06/2020  ° K 4.4 06/06/2020  ° CO2 22 05/14/2020  ° GLUCOSE 90 06/06/2020  ° BUN 12  06/06/2020  ° CREATININE 0.65 06/06/2020  ° BILITOT 0.3 06/06/2020  ° ALKPHOS 71 06/06/2020  ° AST 15   06/06/2020   ALT 29 05/14/2020   PROT 7.2 06/06/2020   ALBUMIN 4.1 06/06/2020   CALCIUM 9.0 06/06/2020   ANIONGAP 10 05/14/2020   EGFR 123 06/06/2020   No results found for: CHOL No results found for: HDL No results found for: LDLCALC No results found for: TRIG No results found for: CHOLHDL Lab Results  Component Value Date   HGBA1C 4.8 05/22/2020      Assessment & Plan:   Problem List Items Addressed This Visit       Other   Prediabetes   Other Visit Diagnoses     Pelvic pain    -  Primary   Relevant Orders   US Pelvic Complete With Transvaginal   Class 1 obesity due to excess calories without serious comorbidity with body mass index (BMI) of 34.0 to 34.9 in adult           No orders of the defined types were placed in this encounter.   Follow-up: Return for follow up 6 mon prediabetes [id number].    Vevelyn Francois, NP

## 2021-04-02 NOTE — Telephone Encounter (Signed)
Transition Care Management Follow-up Telephone Call Date of discharge and from where: 03/28/2021 from [facility] How have you been since you were released from the hospital? Pt stated that she is feeling better and has [place] scheduled soon. Pt did not have any questions or concerns at this time.  Any questions or concerns? No  Items Reviewed: Did the pt receive and understand the discharge instructions provided? Yes  Medications obtained and verified? Yes  Other? No  Any new allergies since your discharge? No  Dietary orders reviewed? No Do you have support at home? Yes   Functional Questionnaire: (I = Independent and D = Dependent) ADLs: I  Bathing/Dressing- I  Meal Prep- I  Eating- I  Maintaining continence- I  Transferring/Ambulation- I  Managing Meds- I   Follow up appointments reviewed:  PCP Hospital f/u appt confirmed? Yes  Thad Ranger, NP seen yesterday.  Specialist Hospital f/u appt confirmed? Yes  Scheduled to see Mariel Aloe, MD on 04/15/2021 @ 1:30pm. Are transportation arrangements needed? Yes  If their condition worsens, is the pt aware to call PCP or go to the Emergency Dept.? No Was the patient provided with contact information for the PCP's office or ED? No Was to pt encouraged to call back with questions or concerns? No

## 2021-04-15 ENCOUNTER — Other Ambulatory Visit: Payer: Self-pay

## 2021-04-15 ENCOUNTER — Other Ambulatory Visit (HOSPITAL_COMMUNITY)
Admission: RE | Admit: 2021-04-15 | Discharge: 2021-04-15 | Disposition: A | Discharge status: Home / Self Care | Payer: Medicaid Other | Source: Ambulatory Visit | Attending: Obstetrics and Gynecology | Admitting: Obstetrics and Gynecology

## 2021-04-15 ENCOUNTER — Ambulatory Visit: Payer: Medicaid Other | Admitting: Obstetrics and Gynecology

## 2021-04-15 DIAGNOSIS — N920 Excessive and frequent menstruation with regular cycle: Secondary | ICD-10-CM | POA: Insufficient documentation

## 2021-04-15 DIAGNOSIS — N946 Dysmenorrhea, unspecified: Secondary | ICD-10-CM

## 2021-04-15 NOTE — Progress Notes (Signed)
°  CC: heavy painful menses Subjective:    Patient ID: Sarah Mayo, female    DOB: 06/24/1990, 31 y.o.   MRN: [id number]  Gynecologic Exam The patient's primary symptoms include pelvic pain. Associated symptoms include nausea. Pertinent negatives include no constipation, diarrhea or dysuria.  31 yo G3P3, SVD x 2, c/s x 1 seen at [facility] office with report of several years of heavy, painful menses.  Menses are regular and last 3-4 days.  Usually they are heaviest on the first day.  The patient has had a BTL.  Pt does state that  naprosyn does help with the pain.  She has not tried a hormonal intervention.  Pt was concerned about fibroids and endometriosis as several members of her family have had both conditions.    Pt's sister had hysterectomy at age 47 due to endometriosis.  Pt does not have current u/s.     Review of Systems  Constitutional: Negative.   HENT: Negative.    Respiratory: Negative.    Cardiovascular: Negative.   Gastrointestinal:  Positive for nausea. Negative for constipation and diarrhea.  Genitourinary:  Positive for menstrual problem and pelvic pain. Negative for dyspareunia and dysuria.      Objective:   Physical Exam Constitutional:      Appearance: Normal appearance. She is obese.  HENT:     Head: Normocephalic and atraumatic.  Cardiovascular:     Rate and Rhythm: Normal rate and regular rhythm.     Heart sounds: Normal heart sounds.  Pulmonary:     Effort: Pulmonary effort is normal.     Breath sounds: Normal breath sounds.  Abdominal:     General: Abdomen is flat.     Palpations: Abdomen is soft.     Tenderness: There is no abdominal tenderness. There is no guarding.  Genitourinary:    Comments: SVE: no CMT, uterus normal size and mobile, no adnexal masses or pain Vagina WNL Vaginal swab taken. Neurological:     Mental Status: She is alert.   Vitals:   04/15/21 1330  BP: 120/72  Pulse: 65         Assessment & Plan:   1.  Dysmenorrhea Pattern of pain not strongly suggestive of endometriosis, response to NSAIDs is promising.  May be candidate for OCP therapy if needed.  - Cervicovaginal ancillary only( [place]) - US PELVIC COMPLETE WITH TRANSVAGINAL; Future  2. Menorrhagia with regular cycle Uterus normal in size, pelvic u/s pending.  No obvious sign of fibroids.   Can consider lysteda, OCP , progesterone IUD, or ablation if needed for control of heavier cycles.   - Cervicovaginal ancillary only( [place]) - US PELVIC COMPLETE WITH TRANSVAGINAL; Future  F/u in 6 weeks to discuss u/s findings and discuss possible treatment plan.  Warden Fillers, MD Faculty Attending, [facility] for [facility]

## 2021-04-15 NOTE — Progress Notes (Signed)
Pt states she is having increase in pain with cycles - hips, buttocks and lower pelvis. Pt is using OTC meds with little relief.  Cycles last 4-5 days- heavy flow.   Pt states there is a family hx of endometriosis and fibroids.

## 2021-04-17 LAB — CERVICOVAGINAL ANCILLARY ONLY
Bacterial Vaginitis (gardnerella): POSITIVE — AB
Chlamydia: NEGATIVE
Comment: NEGATIVE
Comment: NEGATIVE
Comment: NEGATIVE
Comment: NORMAL
Neisseria Gonorrhea: NEGATIVE
Trichomonas: NEGATIVE

## 2021-04-21 ENCOUNTER — Other Ambulatory Visit: Payer: Self-pay

## 2021-04-21 DIAGNOSIS — N760 Acute vaginitis: Secondary | ICD-10-CM

## 2021-04-21 DIAGNOSIS — B9689 Other specified bacterial agents as the cause of diseases classified elsewhere: Secondary | ICD-10-CM

## 2021-04-21 MED ORDER — METRONIDAZOLE 500 MG PO TABS: 500.0000 mg | ORAL_TABLET | Freq: Two times a day (BID) | ORAL | 0 refills | Status: AC

## 2021-04-27 ENCOUNTER — Encounter (HOSPITAL_COMMUNITY): Payer: Self-pay

## 2021-04-27 ENCOUNTER — Other Ambulatory Visit: Payer: Self-pay

## 2021-04-27 ENCOUNTER — Emergency Department (HOSPITAL_COMMUNITY): Payer: Medicaid Other

## 2021-04-27 ENCOUNTER — Emergency Department (HOSPITAL_COMMUNITY)
Admission: EM | Admit: 2021-04-27 | Discharge: 2021-04-27 | Disposition: A | Discharge status: Home / Self Care | Payer: Medicaid Other | Attending: Emergency Medicine | Admitting: Emergency Medicine

## 2021-04-27 DIAGNOSIS — N9489 Other specified conditions associated with female genital organs and menstrual cycle: Secondary | ICD-10-CM | POA: Insufficient documentation

## 2021-04-27 DIAGNOSIS — R102 Pelvic and perineal pain: Secondary | ICD-10-CM | POA: Diagnosis not present

## 2021-04-27 DIAGNOSIS — N946 Dysmenorrhea, unspecified: Secondary | ICD-10-CM | POA: Diagnosis not present

## 2021-04-27 LAB — CBC WITH DIFFERENTIAL/PLATELET
Abs Immature Granulocytes: 0.04 10*3/uL (ref 0.00–0.07)
Basophils Absolute: 0 10*3/uL (ref 0.0–0.1)
Basophils Relative: 0 %
Eosinophils Absolute: 0.1 10*3/uL (ref 0.0–0.5)
Eosinophils Relative: 1 %
HCT: 35.8 % — ABNORMAL LOW (ref 36.0–46.0)
Hemoglobin: 12.4 g/dL (ref 12.0–15.0)
Immature Granulocytes: 0 %
Lymphocytes Relative: 21 %
Lymphs Abs: 2.3 10*3/uL (ref 0.7–4.0)
MCH: 27.6 pg (ref 26.0–34.0)
MCHC: 34.6 g/dL (ref 30.0–36.0)
MCV: 79.7 fL — ABNORMAL LOW (ref 80.0–100.0)
Monocytes Absolute: 0.5 10*3/uL (ref 0.1–1.0)
Monocytes Relative: 4 %
Neutro Abs: 8 10*3/uL — ABNORMAL HIGH (ref 1.7–7.7)
Neutrophils Relative %: 74 %
Platelets: 295 10*3/uL (ref 150–400)
RBC: 4.49 MIL/uL (ref 3.87–5.11)
RDW: 13.9 % (ref 11.5–15.5)
WBC: 10.9 10*3/uL — ABNORMAL HIGH (ref 4.0–10.5)
nRBC: 0 % (ref 0.0–0.2)

## 2021-04-27 LAB — COMPREHENSIVE METABOLIC PANEL
ALT: 16 U/L (ref 0–44)
AST: 14 U/L — ABNORMAL LOW (ref 15–41)
Albumin: 4 g/dL (ref 3.5–5.0)
Alkaline Phosphatase: 49 U/L (ref 38–126)
Anion gap: 7 (ref 5–15)
BUN: 10 mg/dL (ref 6–20)
CO2: 25 mmol/L (ref 22–32)
Calcium: 8.7 mg/dL — ABNORMAL LOW (ref 8.9–10.3)
Chloride: 106 mmol/L (ref 98–111)
Creatinine, Ser: 0.61 mg/dL (ref 0.44–1.00)
GFR, Estimated: >60 mL/min (ref >60)
Glucose, Bld: 103 mg/dL — ABNORMAL HIGH (ref 70–99)
Potassium: 4 mmol/L (ref 3.5–5.1)
Sodium: 138 mmol/L (ref 135–145)
Total Bilirubin: 0.2 mg/dL — ABNORMAL LOW (ref 0.3–1.2)
Total Protein: 7.6 g/dL (ref 6.5–8.1)

## 2021-04-27 LAB — URINALYSIS, ROUTINE W REFLEX MICROSCOPIC
Bilirubin Urine: NEGATIVE
Glucose, UA: NEGATIVE mg/dL
Ketones, ur: NEGATIVE mg/dL
Nitrite: NEGATIVE
Protein, ur: 30 mg/dL — AB
RBC / HPF: >50 RBC/hpf — ABNORMAL HIGH (ref 0–5)
Specific Gravity, Urine: 1.03 (ref 1.005–1.030)
pH: 5 (ref 5.0–8.0)

## 2021-04-27 LAB — I-STAT BETA HCG BLOOD, ED (MC, WL, AP ONLY): I-stat hCG, quantitative: <5 m[IU]/mL (ref <5)

## 2021-04-27 LAB — LIPASE, BLOOD: Lipase: 29 U/L (ref 11–51)

## 2021-04-27 MED ORDER — KETOROLAC TROMETHAMINE 10 MG PO TABS
10.0000 mg | ORAL_TABLET | Freq: Once | ORAL | Status: DC
  Filled 2021-04-27: qty 1

## 2021-04-27 MED ORDER — KETOROLAC TROMETHAMINE 10 MG PO TABS: 10.0000 mg | ORAL_TABLET | Freq: Four times a day (QID) | ORAL | 0 refills | Status: AC | PRN

## 2021-04-27 NOTE — ED Provider Triage Note (Signed)
Emergency Medicine Provider Triage Evaluation Note  Sarah Mayo , a 31 y.o. female  was evaluated in triage.  Pt complains of pelvic pain.  Patient states she has this every time she has a menstrual cycle.  Has an ultrasound scheduled on Thursday but "cannot wait because the pain is too severe".  Family history of endometriosis and fibroids, patient very concerned..  Review of Systems  Positive: Pelvic pain Negative: syncope  Physical Exam  BP 122/73 (BP Location: Left Arm)    Pulse 68    Temp 98.5 F (36.9 C) (Oral)    Resp 18    Ht 5\' 9"  (1.753 m)    Wt 102.5 kg    LMP 04/27/2021    SpO2 100%    BMI 33.37 kg/m  Gen:   Awake, no distress   Resp:  Normal effort  MSK:   Moves extremities without difficulty  Other:  Abdomen soft  Medical Decision Making  Medically screening exam initiated at 12:01 PM.  Appropriate orders placed.  Sarah Mayo was informed that the remainder of the evaluation will be completed by another provider, this initial triage assessment does not replace that evaluation, and the importance of remaining in the ED until their evaluation is complete.  Labs   Sarah Mayo, Sarah Mayo 04/27/21 1202

## 2021-04-27 NOTE — ED Provider Notes (Signed)
[facility] DEPT Provider Note   CSN: [id number] Arrival date & time: 04/27/21  1055     History  Chief Complaint  Patient presents with   Pelvic Pain    Sarah Mayo is a 31 y.o. female.  31 year old female presents with complaint of pelvic pain.  Patient states that she is concerned she may have endometriosis or fibroids, has very painful periods that cause her to also experience dizziness and feel generally unwell.  Patient has been to the emergency room as well as her gynecologist, has had a pelvic exam and is scheduled for an ultrasound in 3 days however states she is taking ibuprofen, Motrin, Midol (rotating through these medications) without any improvement in her pain and is here today for her ultrasound.  Reports family history of endometriosis.  Has had a prior tubal ligation and is not interested in oral hormone therapy.      Home Medications Prior to Admission medications   Medication Sig Start Date End Date Taking? Authorizing Provider  acetaminophen (TYLENOL) 500 MG tablet Take 500 mg by mouth every 6 (six) hours as needed for moderate pain.   Yes [provider]  ketorolac (TORADOL) 10 MG tablet Take 1 tablet (10 mg total) by mouth every 6 (six) hours as needed. 04/27/21  Yes Tacy Learn, PA-C  metroNIDAZOLE (FLAGYL) 500 MG tablet Take 1 tablet (500 mg total) by mouth 2 (two) times daily. 04/21/21  Yes Griffin Basil, MD  drospirenone-ethinyl estradiol (YASMIN 28) 3-0.03 MG tablet Take 1 tablet by mouth daily. Patient not taking: Reported on 02/12/2019 06/06/17 04/07/19  Woodroe Mode, MD  fluticasone [facility]) 50 MCG/ACT nasal spray Place 2 sprays into both nostrils daily. Patient not taking: No sig reported 06/13/18 04/07/19  Lestine Box, PA-C      Allergies    Patient has no known allergies.    Review of Systems   Review of Systems Negative except as per HPI Physical Exam Updated Vital Signs BP 111/77     Pulse 68    Temp 98.5 F (36.9 C) (Oral)    Resp 18    Ht 5\' 9"  (1.753 m)    Wt 102.5 kg    LMP 04/27/2021    SpO2 100%    BMI 33.37 kg/m  Physical Exam Vitals and nursing note reviewed.  Constitutional:      Appearance: Normal appearance.  HENT:     Head: Normocephalic and atraumatic.  Eyes:     Conjunctiva/sclera: Conjunctivae normal.  Cardiovascular:     Rate and Rhythm: Normal rate and regular rhythm.     Heart sounds: Normal heart sounds.  Pulmonary:     Effort: Pulmonary effort is normal.     Breath sounds: Normal breath sounds.  Skin:    General: Skin is warm and dry.     Findings: No erythema or rash.  Neurological:     Mental Status: She is alert and oriented to person, place, and time.  Psychiatric:        Behavior: Behavior normal.    ED Results / Procedures / Treatments   Labs (all labs ordered are listed, but only abnormal results are displayed) Labs Reviewed  CBC WITH DIFFERENTIAL/PLATELET - Abnormal; Notable for the following components:      Result Value   WBC 10.9 (*)    HCT 35.8 (*)    MCV 79.7 (*)    Neutro Abs 8.0 (*)    All other components within normal  limits  COMPREHENSIVE METABOLIC PANEL - Abnormal; Notable for the following components:   Glucose, Bld 103 (*)    Calcium 8.7 (*)    AST 14 (*)    Total Bilirubin 0.2 (*)    All other components within normal limits  URINALYSIS, ROUTINE W REFLEX MICROSCOPIC - Abnormal; Notable for the following components:   APPearance CLOUDY (*)    Hgb urine dipstick LARGE (*)    Protein, ur 30 (*)    Leukocytes,Ua SMALL (*)    RBC / HPF >50 (*)    Bacteria, UA RARE (*)    All other components within normal limits  LIPASE, BLOOD  I-STAT BETA HCG BLOOD, ED ([facility], [facility], [facility] ONLY)    EKG None  Radiology US Transvaginal Non-OB  Result Date: 04/27/2021 CLINICAL DATA:  Pelvic pain EXAM: TRANSABDOMINAL AND TRANSVAGINAL ULTRASOUND OF PELVIS DOPPLER ULTRASOUND OF OVARIES TECHNIQUE: Both transabdominal and  transvaginal ultrasound examinations of the pelvis were performed. Transabdominal technique was performed for global imaging of the pelvis including uterus, ovaries, adnexal regions, and pelvic cul-de-sac. It was necessary to proceed with endovaginal exam following the transabdominal exam to visualize the endometrium and ovaries. Color and duplex Doppler ultrasound was utilized to evaluate blood flow to the ovaries. COMPARISON:  None. FINDINGS: Uterus Measurements: 8.2 x 4.6 x 5.1 cm = volume: 100.6 mL. No fibroids or other mass visualized. Endometrium Thickness: 7 mm.  No focal abnormality visualized. Right ovary Measurements: 3.2 x 1.9 x 2.1 cm = volume: 6.6 mL. Normal appearance/no adnexal mass. Left ovary Measurements: 4.1 x 2.3 x 2.3 cm = volume: 10.9 mL. Normal appearance/no adnexal mass. Pulsed Doppler evaluation of both ovaries demonstrates normal low-resistance arterial and venous waveforms. Other findings Trace pelvic free fluid likely physiologic. IMPRESSION: 1. Normal pelvic ultrasound. 2. No ovarian torsion. Electronically Signed   By: Elige Ko M.D.   On: 04/27/2021 17:44   US Pelvis Complete  Result Date: 04/27/2021 CLINICAL DATA:  Pelvic pain EXAM: TRANSABDOMINAL AND TRANSVAGINAL ULTRASOUND OF PELVIS DOPPLER ULTRASOUND OF OVARIES TECHNIQUE: Both transabdominal and transvaginal ultrasound examinations of the pelvis were performed. Transabdominal technique was performed for global imaging of the pelvis including uterus, ovaries, adnexal regions, and pelvic cul-de-sac. It was necessary to proceed with endovaginal exam following the transabdominal exam to visualize the endometrium and ovaries. Color and duplex Doppler ultrasound was utilized to evaluate blood flow to the ovaries. COMPARISON:  None. FINDINGS: Uterus Measurements: 8.2 x 4.6 x 5.1 cm = volume: 100.6 mL. No fibroids or other mass visualized. Endometrium Thickness: 7 mm.  No focal abnormality visualized. Right ovary Measurements: 3.2 x  1.9 x 2.1 cm = volume: 6.6 mL. Normal appearance/no adnexal mass. Left ovary Measurements: 4.1 x 2.3 x 2.3 cm = volume: 10.9 mL. Normal appearance/no adnexal mass. Pulsed Doppler evaluation of both ovaries demonstrates normal low-resistance arterial and venous waveforms. Other findings Trace pelvic free fluid likely physiologic. IMPRESSION: 1. Normal pelvic ultrasound. 2. No ovarian torsion. Electronically Signed   By: Elige Ko M.D.   On: 04/27/2021 17:44   [place] Art/Ven Flow Abd Pelv Doppler  Result Date: 04/27/2021 CLINICAL DATA:  Pelvic pain EXAM: TRANSABDOMINAL AND TRANSVAGINAL ULTRASOUND OF PELVIS DOPPLER ULTRASOUND OF OVARIES TECHNIQUE: Both transabdominal and transvaginal ultrasound examinations of the pelvis were performed. Transabdominal technique was performed for global imaging of the pelvis including uterus, ovaries, adnexal regions, and pelvic cul-de-sac. It was necessary to proceed with endovaginal exam following the transabdominal exam to visualize the endometrium and ovaries. Color and duplex  Doppler ultrasound was utilized to evaluate blood flow to the ovaries. COMPARISON:  None. FINDINGS: Uterus Measurements: 8.2 x 4.6 x 5.1 cm = volume: 100.6 mL. No fibroids or other mass visualized. Endometrium Thickness: 7 mm.  No focal abnormality visualized. Right ovary Measurements: 3.2 x 1.9 x 2.1 cm = volume: 6.6 mL. Normal appearance/no adnexal mass. Left ovary Measurements: 4.1 x 2.3 x 2.3 cm = volume: 10.9 mL. Normal appearance/no adnexal mass. Pulsed Doppler evaluation of both ovaries demonstrates normal low-resistance arterial and venous waveforms. Other findings Trace pelvic free fluid likely physiologic. IMPRESSION: 1. Normal pelvic ultrasound. 2. No ovarian torsion. Electronically Signed   By: Kathreen Devoid M.D.   On: 04/27/2021 17:44    Procedures Procedures    Medications Ordered in ED Medications  ketorolac (TORADOL) tablet 10 mg (has no administration in time range)    ED Course/  Medical Decision Making/ A&P                           Medical Decision Making Amount and/or Complexity of Data Reviewed Radiology: ordered. ECG/medicine tests: ordered.  Risk Prescription drug management.   30 year old female with complaint of painful menstrual cycles as above.  Ultrasound reassuring does not show fibroid or other acute abnormality today.  Labs are reassuring, patient is not anemic, hCG negative, no electrolyte disturbance.  Recommend patient follow-up with her gynecologist, will try Toradol, advised not to use any other NSAIDs while taking Toradol. Alec exam deferred, states that she has already had this with her gynecologist recently.        Final Clinical Impression(s) / ED Diagnoses Final diagnoses:  Dysmenorrhea    Rx / DC Orders ED Discharge Orders          Ordered    ketorolac (TORADOL) 10 MG tablet  Every 6 hours PRN        04/27/21 1807              Tacy Learn, PA-C 04/27/21 1813    Jeanell Sparrow, DO 04/29/21 0130

## 2021-04-27 NOTE — ED Notes (Signed)
I provided reinforced discharge education based off of discharge instructions. Pt acknowledged and understood my education. Pt had no further questions/concerns for provider/myself.  °

## 2021-04-27 NOTE — ED Triage Notes (Signed)
Patient reports that she gets pelvic pain every time her period starts. Patient states she has heavy vaginal bleeding. Patient states she has seen before for the same symptoms. Patient states she is scheduled for an [place] in 3 days. Patient states she can not wait to have the US done then because she is having pain now.

## 2021-04-27 NOTE — Discharge Instructions (Addendum)
Follow-up with your gynecologist for recheck. Your ultrasound today is unremarkable, does not see any fibroids. Try Toradol for pain.  Do not take other NSAIDs such as Aleve, Advil, ibuprofen, Motrin if taking Toradol.

## 2021-04-28 ENCOUNTER — Telehealth: Payer: Self-pay | Admitting: *Deleted

## 2021-04-28 NOTE — Telephone Encounter (Signed)
Transition Care Management Follow-up Telephone Call Date of discharge and from where: 04/27/2021 - [facility] How have you been since you were released from the hospital? "Doing okay" Any questions or concerns? No  Items Reviewed: Did the pt receive and understand the discharge instructions provided? Yes  Medications obtained and verified? Yes  Other? No  Any new allergies since your discharge? No  Dietary orders reviewed? No Do you have support at home? Yes    Functional Questionnaire: (I = Independent and D = Dependent) ADLs: I  Bathing/Dressing- I  Meal Prep- I  Eating- I  Maintaining continence- I  Transferring/Ambulation- I  Managing Meds- I  Follow up appointments reviewed:  PCP Hospital f/u appt confirmed? No   Specialist Hospital f/u appt confirmed? Yes  Scheduled to see OB/GYN on 05/28/2021 @ 1035. Are transportation arrangements needed? No  If their condition worsens, is the pt aware to call PCP or go to the Emergency Dept.? Yes Was the patient provided with contact information for the PCP's office or ED? Yes Was to pt encouraged to call back with questions or concerns? Yes

## 2021-04-30 ENCOUNTER — Ambulatory Visit (HOSPITAL_COMMUNITY): Admission: RE | Admit: 2021-04-30 | Payer: Medicaid Other | Source: Ambulatory Visit

## 2021-05-28 ENCOUNTER — Ambulatory Visit: Payer: Medicaid Other | Admitting: Obstetrics and Gynecology

## 2021-07-16 DIAGNOSIS — Z20822 Contact with and (suspected) exposure to covid-19: Secondary | ICD-10-CM | POA: Diagnosis not present

## 2021-07-16 DIAGNOSIS — J020 Streptococcal pharyngitis: Secondary | ICD-10-CM | POA: Diagnosis not present

## 2021-07-29 ENCOUNTER — Other Ambulatory Visit: Payer: Self-pay | Admitting: Nurse Practitioner

## 2021-07-29 MED ORDER — ACETAMINOPHEN 500 MG PO TABS: 500.0000 mg | ORAL_TABLET | Freq: Four times a day (QID) | ORAL | 0 refills | Status: AC | PRN

## 2021-09-16 DIAGNOSIS — M9905 Segmental and somatic dysfunction of pelvic region: Secondary | ICD-10-CM | POA: Diagnosis not present

## 2021-09-16 DIAGNOSIS — M9903 Segmental and somatic dysfunction of lumbar region: Secondary | ICD-10-CM | POA: Diagnosis not present

## 2021-09-16 DIAGNOSIS — M5386 Other specified dorsopathies, lumbar region: Secondary | ICD-10-CM | POA: Diagnosis not present

## 2021-09-16 DIAGNOSIS — M9904 Segmental and somatic dysfunction of sacral region: Secondary | ICD-10-CM | POA: Diagnosis not present

## 2021-09-30 ENCOUNTER — Ambulatory Visit: Payer: Self-pay | Admitting: Nurse Practitioner

## 2021-10-07 ENCOUNTER — Ambulatory Visit: Payer: Medicaid Other | Admitting: Nurse Practitioner

## 2021-10-22 DIAGNOSIS — L0291 Cutaneous abscess, unspecified: Secondary | ICD-10-CM | POA: Diagnosis not present

## 2021-10-23 ENCOUNTER — Other Ambulatory Visit: Payer: Self-pay

## 2021-10-23 ENCOUNTER — Emergency Department (HOSPITAL_COMMUNITY)
Admission: EM | Admit: 2021-10-23 | Discharge: 2021-10-24 | Discharge status: Against Medical Advice | Payer: Medicaid Other | Attending: Emergency Medicine | Admitting: Emergency Medicine

## 2021-10-23 DIAGNOSIS — L02213 Cutaneous abscess of chest wall: Secondary | ICD-10-CM | POA: Insufficient documentation

## 2021-10-23 DIAGNOSIS — Z5321 Procedure and treatment not carried out due to patient leaving prior to being seen by health care provider: Secondary | ICD-10-CM | POA: Insufficient documentation

## 2021-10-23 NOTE — ED Triage Notes (Signed)
Pt via POV c/o centralized chest abscess a few days ago. Was seen at urgent care yesterday abscess was drained and was prescribed doxycycline. Pt was recommended ED visit if symptoms did not improve. Pt states she is concerned for increased swelling and worsening pain. No drainage. Denies Fevers. VS WDL.

## 2021-10-24 NOTE — ED Notes (Signed)
Called x2 for vitals and by patient access and no response.

## 2021-10-28 ENCOUNTER — Ambulatory Visit: Payer: Medicaid Other | Admitting: Nurse Practitioner

## 2021-12-03 DIAGNOSIS — L03313 Cellulitis of chest wall: Secondary | ICD-10-CM | POA: Diagnosis not present

## 2021-12-11 DIAGNOSIS — N611 Abscess of the breast and nipple: Secondary | ICD-10-CM | POA: Diagnosis not present

## 2021-12-11 DIAGNOSIS — N6081 Other benign mammary dysplasias of right breast: Secondary | ICD-10-CM | POA: Diagnosis not present

## 2021-12-11 DIAGNOSIS — L720 Epidermal cyst: Secondary | ICD-10-CM | POA: Diagnosis not present

## 2021-12-14 DIAGNOSIS — N611 Abscess of the breast and nipple: Secondary | ICD-10-CM | POA: Diagnosis not present

## 2021-12-14 DIAGNOSIS — N6082 Other benign mammary dysplasias of left breast: Secondary | ICD-10-CM | POA: Diagnosis not present

## 2022-02-03 DIAGNOSIS — N611 Abscess of the breast and nipple: Secondary | ICD-10-CM | POA: Diagnosis not present

## 2022-06-10 DIAGNOSIS — L732 Hidradenitis suppurativa: Secondary | ICD-10-CM | POA: Diagnosis not present

## 2022-07-04 ENCOUNTER — Ambulatory Visit
Admission: EM | Admit: 2022-07-04 | Discharge: 2022-07-04 | Disposition: A | Discharge status: Home / Self Care | Payer: Medicaid Other | Attending: Physician Assistant | Admitting: Physician Assistant

## 2022-07-04 DIAGNOSIS — J869 Pyothorax without fistula: Secondary | ICD-10-CM

## 2022-07-04 NOTE — Discharge Instructions (Signed)
Return if any problems.

## 2022-07-04 NOTE — ED Triage Notes (Signed)
Patient presents with a abscess on her right breast that she stated has been their for a week.

## 2022-07-06 DIAGNOSIS — J869 Pyothorax without fistula: Secondary | ICD-10-CM | POA: Diagnosis not present

## 2022-07-06 NOTE — ED Provider Notes (Signed)
[name]    CSN: [id number] Arrival date & time: 07/04/22  0802      History   Chief Complaint Chief Complaint  Patient presents with   Abscess    HPI Sarah Mayo is a 32 y.o. female.   Patient complains of a large abscess on her left breast.  Patient reports the area is pointing and she is requesting drainage.  Patient has a history of hidradenitis suppurativa.  The history is provided by the patient.  Abscess Abscess location: breast. 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   History of marijuana use 07/23/2014   Prediabetes     Patient Active Problem List   Diagnosis Date Noted   Dysmenorrhea 04/15/2021   Menorrhagia with regular cycle 04/15/2021   Prediabetes 01/02/2020    Past Surgical History:  Procedure Laterality Date   CESAREAN SECTION N/A 11/21/2019   Procedure: CESAREAN SECTION;  Surgeon: Kathrynn Running, MD;  Location: [facility] LD ORS;  Service: Obstetrics;  Laterality: N/A;    OB History     Gravida  3   Para  3   Term  3   Preterm      AB      Living  3      SAB      IAB      Ectopic      Multiple  0   Live Births  3            Home Medications    Prior to Admission medications   Medication Sig Start Date End Date Taking? Authorizing Provider  ketorolac (TORADOL) 10 MG tablet Take 1 tablet (10 mg total) by mouth every 6 (six) hours as needed. 04/27/21   Jeannie Fend, PA-C  metroNIDAZOLE (FLAGYL) 500 MG tablet Take 1 tablet (500 mg total) by mouth 2 (two) times daily. 04/21/21   Warden Fillers, MD  drospirenone-ethinyl estradiol (YASMIN 28) 3-0.03 MG tablet Take 1 tablet by mouth daily. Patient not taking: Reported on 02/12/2019  06/06/17 04/07/19  Adam Phenix, MD  fluticasone [facility]) 50 MCG/ACT nasal spray Place 2 sprays into both nostrils daily. Patient not taking: No sig reported 06/13/18 04/07/19  Rennis Harding, PA-C    Family History Family History  Problem Relation Age of Onset   Asthma Brother    Sickle cell trait Mother    Diabetes Paternal Grandmother     Social History Social History   Tobacco Use   Smoking status: Never   Smokeless tobacco: Never  Vaping Use   Vaping Use: Never used  Substance Use Topics   Alcohol use: No    Comment: Social, Last drink August 2018   Drug use: Not Currently    Types: Marijuana    Comment: quit with pregnancy     Allergies   Patient has no known allergies.   Review of Systems Review of Systems  All other systems reviewed and are negative.    Physical Exam Triage Vital Signs ED Triage Vitals  Enc Vitals Group     BP 07/04/22 0815 117/67     Pulse Rate 07/04/22 0815 79     Resp 07/04/22 0815 18     Temp 07/04/22  0815 98 F (36.7 C)     Temp src --      SpO2 07/04/22 0815 98 %     Weight --      Height --      Head Circumference --      Peak Flow --      Pain Score 07/04/22 0814 8     Pain Loc --      Pain Edu? --      Excl. in GC? --    No data found.  Updated Vital Signs BP 117/67   Pulse 79   Temp 98 F (36.7 C)   Resp 18   LMP 06/28/2022   SpO2 98%   Visual Acuity Right Eye Distance:   Left Eye Distance:   Bilateral Distance:    Right Eye Near:   Left Eye Near:    Bilateral Near:     Physical Exam Vitals reviewed.  Constitutional:      Appearance: Normal appearance.  Cardiovascular:     Rate and Rhythm: Normal rate.  Pulmonary:     Effort: Pulmonary effort is normal.  Skin:    Comments: 15 cm area of erythema pointing fluctuant abscess  Neurological:     General: No focal deficit present.     Mental Status: She is alert.  Psychiatric:        Mood and Affect: Mood normal.      UC Treatments /  Results  Labs (all labs ordered are listed, but only abnormal results are displayed) Labs Reviewed - No data to display  EKG   Radiology No results found.  Procedures Incision and Drainage  Date/Time: 07/06/2022 3:09 PM  Performed by: Elson Areas, PA-C Authorized by: Elson Areas, PA-C   Consent:    Consent given by:  Patient   Alternatives discussed:  Alternative treatment Universal protocol:    Procedure explained and questions answered to patient or proxy's satisfaction: yes     Immediately prior to procedure, a time out was called: yes   Location:    Type:  Abscess   Size:  15 Pre-procedure details:    Skin preparation:  Povidone-iodine Procedure type:    Complexity:  Simple Procedure details:    Ultrasound guidance: no     Incision types:  Stab incision   Drainage amount:  Copious   Packing materials:  None Post-procedure details:    Procedure completion:  Tolerated  (including critical care time)  Medications Ordered in UC Medications - No data to display  Initial Impression / Assessment and Plan / UC Course  I have reviewed the triage vital signs and the nursing notes.  Pertinent labs & imaging results that were available during my care of the patient were reviewed by me and considered in my medical decision making (see chart for details).     Pt counseled on need for follow up  Final Clinical Impressions(s) / UC Diagnoses   Final diagnoses:  Abscess of chest     Discharge Instructions      Return if any problems    ED Prescriptions   None    PDMP not reviewed this encounter. An After Visit Summary was printed and given to the patient.       Elson Areas, [place] 07/06/22 1511

## 2022-12-23 DIAGNOSIS — L732 Hidradenitis suppurativa: Secondary | ICD-10-CM | POA: Diagnosis not present

## 2023-04-22 DIAGNOSIS — L732 Hidradenitis suppurativa: Secondary | ICD-10-CM | POA: Diagnosis not present

## 2023-05-02 ENCOUNTER — Telehealth: Payer: Self-pay | Admitting: Nurse Practitioner

## 2023-05-02 NOTE — Telephone Encounter (Signed)
 Patient called to see if we are accepting patients for sickle cell diagnosis

## 2023-05-05 DIAGNOSIS — L089 Local infection of the skin and subcutaneous tissue, unspecified: Secondary | ICD-10-CM | POA: Diagnosis not present

## 2023-05-05 DIAGNOSIS — L732 Hidradenitis suppurativa: Secondary | ICD-10-CM | POA: Diagnosis not present

## 2023-05-10 DIAGNOSIS — L02219 Cutaneous abscess of trunk, unspecified: Secondary | ICD-10-CM | POA: Diagnosis not present

## 2023-05-17 NOTE — Telephone Encounter (Signed)
 Copied from CRM [id number]. Topic: Clinical - Lab/Test Results >> May 17, 2023 11:09 AM Carlatta H wrote: Reason for CRM: Patient needs a EKG an medical clearance for her upcoming on 2/27

## 2023-05-19 ENCOUNTER — Ambulatory Visit (INDEPENDENT_AMBULATORY_CARE_PROVIDER_SITE_OTHER): Payer: Managed Care, Other (non HMO) | Admitting: Nurse Practitioner

## 2023-05-19 ENCOUNTER — Encounter: Payer: Self-pay | Admitting: Nurse Practitioner

## 2023-05-19 VITALS — BP 112/58 | HR 74 | Temp 98.4°F | Ht 69.0 in | Wt 234.4 lb

## 2023-05-19 DIAGNOSIS — Z01818 Encounter for other preprocedural examination: Secondary | ICD-10-CM | POA: Diagnosis not present

## 2023-05-19 DIAGNOSIS — R7303 Prediabetes: Secondary | ICD-10-CM | POA: Diagnosis not present

## 2023-05-19 LAB — POCT GLYCOSYLATED HEMOGLOBIN (HGB A1C): Hemoglobin A1C: 5.1 % (ref 4.0–5.6)

## 2023-05-19 NOTE — Patient Instructions (Addendum)
 1. Prediabetes (Primary)  - POCT glycosylated hemoglobin (Hb A1C)   2. Preop examination  - EKG 12-Lead = NSR  Follow up:  Follow up in 6 months

## 2023-05-19 NOTE — Progress Notes (Addendum)
 Subjective   Patient ID: Sarah Mayo, female    DOB: 1990-06-16, 33 y.o.   MRN: [id number]  Chief Complaint  Patient presents with   Establish Care    EKG needed from [place] to get medical clearance for surgery next month.     Referring provider: Ivonne Andrew, NP  Sarah Mayo is a 33 y.o. 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  She denies any significant health history other than prediabetes, obesity and hidradenitis suppurative.  She is on spironolactone for this.  A1c in office today was 5.1.  Patient is scheduled to have weight loss surgery (lipo 360) next month in [place].  She does bring paperwork today that needs to be completed for the preop.  Patient does need EKG today for preop.  Patient had labs completed through [facility] couple weeks ago. Denies f/c/s, n/v/d, hemoptysis, PND, leg swelling Denies chest pain or edema  EKG: NSR  No Known Allergies  Immunization History  Administered Date(s) Administered   Influenza,inj,Quad PF,6+ Mos 01/11/2017   PFIZER(Purple Top)SARS-COV-2 Vaccination 01/17/2020, 03/01/2020   Tdap 05/24/2013, 07/24/2014, 02/08/2017    Tobacco History: Social History   Tobacco Use  Smoking Status Never  Smokeless Tobacco Never   Counseling given: Not Answered   Outpatient Encounter Medications as of 05/19/2023  Medication Sig   spironolactone (ALDACTONE) 50 MG tablet Take 50 mg by mouth daily.   ketorolac (TORADOL) 10 MG tablet Take 1 tablet (10 mg total) by mouth every 6 (six) hours as needed. (Patient not taking: Reported on 05/19/2023)   metroNIDAZOLE (FLAGYL) 500 MG tablet Take 1 tablet (500 mg total) by mouth 2 (two) times daily.   [DISCONTINUED]  drospirenone-ethinyl estradiol (YASMIN 28) 3-0.03 MG tablet Take 1 tablet by mouth daily. (Patient not taking: Reported on 02/12/2019)   [DISCONTINUED] fluticasone (FLONASE) 50 MCG/ACT nasal spray Place 2 sprays into both nostrils daily. (Patient not taking: No sig reported)   No facility-administered encounter medications on file as of 05/19/2023.    Review of Systems  Review of Systems  Constitutional: Negative.   HENT: Negative.    Cardiovascular: Negative.   Gastrointestinal: Negative.   Allergic/Immunologic: Negative.   Neurological: Negative.   Psychiatric/Behavioral: Negative.       Objective:   BP (!) 112/58   Pulse 74   Temp 98.4 F (36.9 C)   Ht 5\' 9"  (1.753 m)   Wt 234 lb 6.4 oz (106.3 kg)   SpO2 100%   BMI 34.61 kg/m   Wt Readings from Last 5 Encounters:  05/19/23 234 lb 6.4 oz (106.3 kg)  10/23/21 226 lb (102.5 kg)  04/27/21 226 lb (102.5 kg)  04/15/21 226 lb (102.5 kg)  04/01/21 225 lb 3.2 oz (102.2 kg)     Physical Exam Vitals and nursing note reviewed.  Constitutional:      General: She is not in acute distress.    Appearance: She is well-developed.  Cardiovascular:     Rate and Rhythm: Normal rate and regular rhythm.  Pulmonary:     Effort: Pulmonary effort is normal.     Breath sounds: Normal breath sounds.  Neurological:     Mental Status: She is alert and oriented to person, place, and time.  Assessment & Plan:   Prediabetes -     POCT glycosylated hemoglobin (Hb A1C)  Preop examination -     EKG 12-Lead     Return in about 6 months (around 11/16/2023).   Ivonne Andrew, NP 05/19/2023

## 2023-05-26 ENCOUNTER — Encounter: Payer: Self-pay | Admitting: Nurse Practitioner

## 2023-11-01 DIAGNOSIS — F330 Major depressive disorder, recurrent, mild: Secondary | ICD-10-CM | POA: Diagnosis not present

## 2023-11-03 ENCOUNTER — Telehealth: Payer: Self-pay | Admitting: Nurse Practitioner

## 2023-11-03 NOTE — Telephone Encounter (Signed)
 Copied from CRM [id number]. Topic: Clinical - Medical Advice >> Nov 01, 2023  2:48 PM Tobias CROME wrote: Reason for CRM: Patient had visit with behavioral health provider today and was prescribed lexapro 10mg . Patient was advised it would take within 4-6 weeks for medication to start working.   Patient inquiring if PCP would be willing to fill out short term disability forms and FMLA forms, per her behavioral health office they will not be able to fill out paperwork for her. Behavioral health office requires that patients are established for at least 90 days before filling out paperwork.   Patient requesting callback, [phone]

## 2023-11-16 ENCOUNTER — Ambulatory Visit (INDEPENDENT_AMBULATORY_CARE_PROVIDER_SITE_OTHER): Payer: Self-pay | Admitting: Nurse Practitioner

## 2023-11-16 VITALS — BP 125/50 | HR 80 | Temp 98.4°F | Wt 227.4 lb

## 2023-11-16 DIAGNOSIS — Z1329 Encounter for screening for other suspected endocrine disorder: Secondary | ICD-10-CM

## 2023-11-16 DIAGNOSIS — R7303 Prediabetes: Secondary | ICD-10-CM

## 2023-11-16 DIAGNOSIS — Z13220 Encounter for screening for lipoid disorders: Secondary | ICD-10-CM | POA: Diagnosis not present

## 2023-11-16 NOTE — Progress Notes (Signed)
 Subjective   Patient ID: Sarah Mayo, female    DOB: March 21, 1991, 33 y.o.   MRN: [id number]  Chief Complaint  Patient presents with   Medical Management of Chronic Issues    Referring provider: Oley Bascom RAMAN, NP  Sarah Mayo is a 33 y.o. 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  She denies any significant health history other than prediabetes, obesity and hidradenitis suppurative.  She is on spironolactone for this.  A1c in office at last visit was 5.1.  Overall doing well with no new issues or concerns.  Denies f/c/s, n/v/d, hemoptysis, PND, leg swelling. Denies chest pain or edema.    No Known Allergies  Immunization History  Administered Date(s) Administered   Influenza,inj,Quad PF,6+ Mos 01/11/2017   PFIZER(Purple Top)SARS-COV-2 Vaccination 01/17/2020, 03/01/2020   Tdap 05/24/2013, 07/24/2014, 02/08/2017    Tobacco History: Social History   Tobacco Use  Smoking Status Never  Smokeless Tobacco Never   Counseling given: Not Answered   Outpatient Encounter Medications as of 11/16/2023  Medication Sig   escitalopram (LEXAPRO) 10 MG tablet Take 10 mg by mouth daily.   spironolactone (ALDACTONE) 50 MG tablet Take 50 mg by mouth daily.   ketorolac  (TORADOL ) 10 MG tablet Take 1 tablet (10 mg total) by mouth every 6 (six) hours as needed. (Patient not taking: Reported on 05/19/2023)   metroNIDAZOLE  (FLAGYL ) 500 MG tablet Take 1 tablet (500 mg total) by mouth 2 (two) times daily.   [DISCONTINUED] drospirenone -ethinyl estradiol  (YASMIN  28) 3-0.03 MG tablet Take 1 tablet by mouth daily. (Patient not taking: Reported on 02/12/2019)   [DISCONTINUED] fluticasone  (FLONASE ) 50 MCG/ACT nasal spray  Place 2 sprays into both nostrils daily. (Patient not taking: No sig reported)   No facility-administered encounter medications on file as of 11/16/2023.    Review of Systems  Review of Systems  Constitutional: Negative.   HENT: Negative.    Cardiovascular: Negative.   Gastrointestinal: Negative.   Allergic/Immunologic: Negative.   Neurological: Negative.   Psychiatric/Behavioral: Negative.       Objective:   BP (!) 125/50   Pulse 80   Temp 98.4 F (36.9 C) (Oral)   Wt 227 lb 6.4 oz (103.1 kg)   SpO2 100%   BMI 33.58 kg/m   Wt Readings from Last 5 Encounters:  11/16/23 227 lb 6.4 oz (103.1 kg)  05/19/23 234 lb 6.4 oz (106.3 kg)  10/23/21 226 lb (102.5 kg)  04/27/21 226 lb (102.5 kg)  04/15/21 226 lb (102.5 kg)     Physical Exam Vitals and nursing note reviewed.  Constitutional:      General: She is not in acute distress.    Appearance: She is well-developed.  Cardiovascular:     Rate and Rhythm: Normal rate and regular rhythm.  Pulmonary:     Effort: Pulmonary effort is normal.     Breath sounds: Normal breath sounds.  Neurological:     Mental Status: She is alert and oriented to person, place, and time.       Assessment & Plan:   Prediabetes -     CBC -     Comprehensive metabolic panel with GFR -     Hemoglobin A1c  Thyroid disorder screen -  TSH  Lipid screening -     Lipid panel     Return in about 6 months (around 05/18/2024).   Bascom GORMAN Borer, NP 11/16/2023

## 2023-11-17 ENCOUNTER — Ambulatory Visit: Payer: Self-pay | Admitting: Nurse Practitioner

## 2023-11-17 LAB — COMPREHENSIVE METABOLIC PANEL WITH GFR
ALT: 10 IU/L (ref 0–32)
AST: 11 IU/L (ref 0–40)
Albumin: 4.1 g/dL (ref 3.9–4.9)
Alkaline Phosphatase: 58 IU/L (ref 44–121)
BUN/Creatinine Ratio: 14 (ref 9–23)
BUN: 11 mg/dL (ref 6–20)
Bilirubin Total: 0.4 mg/dL (ref 0.0–1.2)
CO2: 24 mmol/L (ref 20–29)
Calcium: 9.1 mg/dL (ref 8.7–10.2)
Chloride: 105 mmol/L (ref 96–106)
Creatinine, Ser: 0.78 mg/dL (ref 0.57–1.00)
Globulin, Total: 2.9 g/dL (ref 1.5–4.5)
Glucose: 94 mg/dL (ref 70–99)
Potassium: 4.2 mmol/L (ref 3.5–5.2)
Sodium: 140 mmol/L (ref 134–144)
Total Protein: 7 g/dL (ref 6.0–8.5)
eGFR: 104 mL/min/1.73 (ref >59)

## 2023-11-17 LAB — LIPID PANEL
Chol/HDL Ratio: 3 ratio (ref 0.0–4.4)
Cholesterol, Total: 193 mg/dL (ref 100–199)
HDL: 65 mg/dL (ref >39)
LDL Chol Calc (NIH): 122 mg/dL — ABNORMAL HIGH (ref 0–99)
Triglycerides: 29 mg/dL (ref 0–149)
VLDL Cholesterol Cal: 6 mg/dL (ref 5–40)

## 2023-11-17 LAB — CBC
Hematocrit: 40.9 % (ref 34.0–46.6)
Hemoglobin: 12.7 g/dL (ref 11.1–15.9)
MCH: 27.5 pg (ref 26.6–33.0)
MCHC: 31.1 g/dL — ABNORMAL LOW (ref 31.5–35.7)
MCV: 89 fL (ref 79–97)
Platelets: 284 x10E3/uL (ref 150–450)
RBC: 4.62 x10E6/uL (ref 3.77–5.28)
RDW: 14.6 % (ref 11.7–15.4)
WBC: 8.1 x10E3/uL (ref 3.4–10.8)

## 2023-11-17 LAB — HEMOGLOBIN A1C
Est. average glucose Bld gHb Est-mCnc: 91 mg/dL
Hgb A1c MFr Bld: 4.8 % (ref 4.8–5.6)

## 2023-11-17 LAB — TSH: TSH: 1.33 u[IU]/mL (ref 0.450–4.500)

## 2023-12-26 DIAGNOSIS — Z113 Encounter for screening for infections with a predominantly sexual mode of transmission: Secondary | ICD-10-CM | POA: Diagnosis not present

## 2023-12-26 DIAGNOSIS — Z114 Encounter for screening for human immunodeficiency virus [HIV]: Secondary | ICD-10-CM | POA: Diagnosis not present

## 2024-05-18 ENCOUNTER — Ambulatory Visit: Payer: Self-pay | Admitting: Nurse Practitioner
